# Patient Record
Sex: Female | Born: 1945 | Race: White | Hispanic: No | Smoking: Current some day smoker
Health system: Southern US, Community
[De-identification: ages and names within clinical notes are randomized; demographics above are authoritative.]

## PROBLEM LIST (undated history)

## (undated) DIAGNOSIS — H269 Unspecified cataract: Secondary | ICD-10-CM

## (undated) DIAGNOSIS — Z8619 Personal history of other infectious and parasitic diseases: Secondary | ICD-10-CM

## (undated) DIAGNOSIS — T7840XA Allergy, unspecified, initial encounter: Secondary | ICD-10-CM

## (undated) DIAGNOSIS — K219 Gastro-esophageal reflux disease without esophagitis: Secondary | ICD-10-CM

## (undated) DIAGNOSIS — F419 Anxiety disorder, unspecified: Secondary | ICD-10-CM

## (undated) DIAGNOSIS — I701 Atherosclerosis of renal artery: Secondary | ICD-10-CM

## (undated) DIAGNOSIS — E785 Hyperlipidemia, unspecified: Secondary | ICD-10-CM

## (undated) DIAGNOSIS — R413 Other amnesia: Secondary | ICD-10-CM

## (undated) DIAGNOSIS — M1990 Unspecified osteoarthritis, unspecified site: Secondary | ICD-10-CM

## (undated) DIAGNOSIS — I150 Renovascular hypertension: Secondary | ICD-10-CM

## (undated) DIAGNOSIS — Z923 Personal history of irradiation: Secondary | ICD-10-CM

## (undated) DIAGNOSIS — Z9889 Other specified postprocedural states: Secondary | ICD-10-CM

## (undated) DIAGNOSIS — I10 Essential (primary) hypertension: Secondary | ICD-10-CM

## (undated) DIAGNOSIS — I809 Phlebitis and thrombophlebitis of unspecified site: Secondary | ICD-10-CM

## (undated) DIAGNOSIS — C189 Malignant neoplasm of colon, unspecified: Secondary | ICD-10-CM

## (undated) DIAGNOSIS — R112 Nausea with vomiting, unspecified: Secondary | ICD-10-CM

## (undated) HISTORY — DX: Anxiety disorder, unspecified: F41.9

## (undated) HISTORY — DX: Personal history of other infectious and parasitic diseases: Z86.19

## (undated) HISTORY — PX: BREAST SURGERY: SHX581

## (undated) HISTORY — PX: NASAL SINUS SURGERY: SHX719

## (undated) HISTORY — DX: Unspecified cataract: H26.9

## (undated) HISTORY — DX: Phlebitis and thrombophlebitis of unspecified site: I80.9

## (undated) HISTORY — DX: Unspecified osteoarthritis, unspecified site: M19.90

## (undated) HISTORY — PX: RENAL ARTERY STENT: SHX2321

## (undated) HISTORY — DX: Allergy, unspecified, initial encounter: T78.40XA

## (undated) HISTORY — PX: ABDOMINAL HYSTERECTOMY: SHX81

## (undated) HISTORY — DX: Essential (primary) hypertension: I10

## (undated) HISTORY — DX: Renovascular hypertension: I15.0

## (undated) HISTORY — DX: Atherosclerosis of renal artery: I70.1

## (undated) HISTORY — DX: Gastro-esophageal reflux disease without esophagitis: K21.9

## (undated) HISTORY — DX: Hyperlipidemia, unspecified: E78.5

## (undated) HISTORY — PX: CATARACT EXTRACTION: SUR2

---

## 1998-07-21 ENCOUNTER — Other Ambulatory Visit: Admission: RE | Admit: 1998-07-21 | Discharge: 1998-07-21 | Payer: Self-pay | Admitting: Obstetrics and Gynecology

## 2001-07-02 ENCOUNTER — Encounter: Payer: Self-pay | Admitting: Cardiovascular Disease

## 2001-07-02 ENCOUNTER — Ambulatory Visit (HOSPITAL_COMMUNITY): Admission: RE | Admit: 2001-07-02 | Discharge: 2001-07-03 | Payer: Self-pay | Admitting: Cardiovascular Disease

## 2001-07-02 HISTORY — PX: RENAL ARTERY ANGIOPLASTY: SHX2317

## 2001-11-06 ENCOUNTER — Other Ambulatory Visit: Admission: RE | Admit: 2001-11-06 | Discharge: 2001-11-06 | Payer: Self-pay | Admitting: Obstetrics and Gynecology

## 2005-04-24 ENCOUNTER — Other Ambulatory Visit: Admission: RE | Admit: 2005-04-24 | Discharge: 2005-04-24 | Payer: Self-pay | Admitting: Obstetrics and Gynecology

## 2005-12-25 ENCOUNTER — Encounter: Admission: RE | Admit: 2005-12-25 | Discharge: 2005-12-25 | Payer: Self-pay | Admitting: Otolaryngology

## 2009-09-08 HISTORY — PX: NM MYOCAR PERF WALL MOTION: HXRAD629

## 2012-06-09 HISTORY — PX: TRANSTHORACIC ECHOCARDIOGRAM: SHX275

## 2012-06-25 ENCOUNTER — Other Ambulatory Visit (HOSPITAL_COMMUNITY): Payer: Self-pay | Admitting: Cardiovascular Disease

## 2012-06-25 DIAGNOSIS — R011 Cardiac murmur, unspecified: Secondary | ICD-10-CM

## 2012-06-25 DIAGNOSIS — I10 Essential (primary) hypertension: Secondary | ICD-10-CM

## 2012-06-25 DIAGNOSIS — I517 Cardiomegaly: Secondary | ICD-10-CM

## 2012-07-08 ENCOUNTER — Ambulatory Visit (HOSPITAL_COMMUNITY)
Admission: RE | Admit: 2012-07-08 | Discharge: 2012-07-08 | Disposition: A | Discharge status: Home / Self Care | Payer: Medicare Other | Source: Ambulatory Visit | Attending: Cardiovascular Disease | Admitting: Cardiovascular Disease

## 2012-07-08 DIAGNOSIS — R011 Cardiac murmur, unspecified: Secondary | ICD-10-CM

## 2012-07-08 DIAGNOSIS — I517 Cardiomegaly: Secondary | ICD-10-CM | POA: Insufficient documentation

## 2012-07-08 DIAGNOSIS — I10 Essential (primary) hypertension: Secondary | ICD-10-CM | POA: Insufficient documentation

## 2012-07-08 NOTE — Progress Notes (Signed)
2D Echo Performed 07/08/2012    Anniebelle Devore, RCS  

## 2012-07-22 ENCOUNTER — Telehealth (HOSPITAL_COMMUNITY): Payer: Self-pay | Admitting: Vascular Surgery

## 2012-07-23 ENCOUNTER — Encounter: Payer: Self-pay | Admitting: Pharmacist Clinician (PhC)/ Clinical Pharmacy Specialist

## 2012-08-06 ENCOUNTER — Encounter: Payer: Self-pay | Admitting: Pharmacist Clinician (PhC)/ Clinical Pharmacy Specialist

## 2012-09-01 ENCOUNTER — Telehealth (HOSPITAL_COMMUNITY): Payer: Self-pay | Admitting: Cardiovascular Disease

## 2012-09-01 ENCOUNTER — Other Ambulatory Visit (HOSPITAL_COMMUNITY): Payer: Self-pay | Admitting: Cardiovascular Disease

## 2012-09-01 DIAGNOSIS — IMO0002 Reserved for concepts with insufficient information to code with codable children: Secondary | ICD-10-CM

## 2012-09-01 NOTE — Telephone Encounter (Signed)
Left message for patient to call back and schedule testing ordered by raw

## 2012-09-08 HISTORY — PX: OTHER SURGICAL HISTORY: SHX169

## 2012-09-15 ENCOUNTER — Ambulatory Visit (HOSPITAL_COMMUNITY)
Admission: RE | Admit: 2012-09-15 | Discharge: 2012-09-15 | Disposition: A | Discharge status: Home / Self Care | Payer: Medicare Other | Source: Ambulatory Visit | Attending: Cardiovascular Disease | Admitting: Cardiovascular Disease

## 2012-09-15 DIAGNOSIS — Z9889 Other specified postprocedural states: Secondary | ICD-10-CM | POA: Insufficient documentation

## 2012-09-15 DIAGNOSIS — IMO0002 Reserved for concepts with insufficient information to code with codable children: Secondary | ICD-10-CM

## 2012-09-15 NOTE — Progress Notes (Signed)
Renal Artery Duplex Completed. °[name] ° °

## 2012-12-11 ENCOUNTER — Other Ambulatory Visit: Payer: Self-pay | Admitting: *Deleted

## 2012-12-11 MED ORDER — ATORVASTATIN CALCIUM 80 MG PO TABS: 80.0000 mg | ORAL_TABLET | Freq: Every day | ORAL | Status: DC

## 2012-12-11 MED ORDER — ATENOLOL 50 MG PO TABS: 50.0000 mg | ORAL_TABLET | Freq: Every day | ORAL | Status: DC

## 2013-04-26 ENCOUNTER — Other Ambulatory Visit: Payer: Self-pay | Admitting: *Deleted

## 2013-04-26 MED ORDER — CANDESARTAN CILEXETIL 32 MG PO TABS: 32.0000 mg | ORAL_TABLET | Freq: Every day | ORAL | Status: DC

## 2013-04-26 NOTE — Telephone Encounter (Signed)
Rx was sent to pharmacy electronically. 

## 2013-07-31 ENCOUNTER — Encounter: Payer: Self-pay | Admitting: *Deleted

## 2013-08-03 ENCOUNTER — Encounter: Payer: Self-pay | Admitting: Cardiovascular Disease

## 2013-08-06 ENCOUNTER — Ambulatory Visit (INDEPENDENT_AMBULATORY_CARE_PROVIDER_SITE_OTHER): Payer: Medicare Other | Admitting: Internal Medicine

## 2013-08-06 ENCOUNTER — Encounter: Payer: Self-pay | Admitting: Internal Medicine

## 2013-08-06 VITALS — BP 170/96 | HR 85 | Ht 63.0 in | Wt 157.3 lb

## 2013-08-06 DIAGNOSIS — I701 Atherosclerosis of renal artery: Secondary | ICD-10-CM

## 2013-08-06 DIAGNOSIS — I10 Essential (primary) hypertension: Secondary | ICD-10-CM

## 2013-08-06 DIAGNOSIS — E785 Hyperlipidemia, unspecified: Secondary | ICD-10-CM

## 2013-08-06 MED ORDER — ATORVASTATIN CALCIUM 80 MG PO TABS: 80.0000 mg | ORAL_TABLET | Freq: Every day | ORAL | Status: DC

## 2013-08-06 MED ORDER — ATENOLOL 50 MG PO TABS: 50.0000 mg | ORAL_TABLET | Freq: Every day | ORAL | Status: DC

## 2013-08-06 NOTE — Progress Notes (Signed)
OFFICE NOTE  Chief Complaint:  No complaints, intolerant to lipitor, estalish new cardiologist  Primary Care Physician: No PCP Per Patient  HPI:  Glenda Stevens is a 68 year old female previously followed by Dr. Rollene Stevens. She is here to establish care with me today. She has a history of labile hypertension in the past which she says is predominantly "white coat" hypertension. She reports her blood pressures are much better at home. She underwent renal artery stenting in 2003 on the left with 2 stents and a subsequently had in-stent restenosis with up to 60-99% stenosis of the left renal artery in 2014.  She does get annual Dopplers. She also has a history of dyslipidemia and recently has been having intolerance to atorvastatin and reports noncompliance with the medication. She is in need for refills today. Finally she is recently made an appointment for a new care provider that is coming up in a few weeks. She denies any chest pain or worsening shortness of breath.  PMHx:  Past Medical History  Diagnosis Date  . Renal artery stenosis     left stent in 2003  . Renovascular hypertension   . Hypertension   . Hyperlipidemia   . GERD (gastroesophageal reflux disease)     Past Surgical History  Procedure Laterality Date  . Transthoracic echocardiogram  06/2012    EF 55-60%, mild MR  . Nm myocar perf wall motion  09/2009    bruce myoview - normal pattern of perfusion, EF 83%, low risk scan  . Renal doppler  09/2012    left renal artery stent - 60-99% diameter reduction  . Renal artery angioplasty  07/02/2001    6x57mm Genesis on Aviator balloon stent overlapping a 6x23mm Genesist on Aviator balloon stent (Dr. Marella Chimes)    FAMHx:  Family History  Problem Relation Age of Onset  . Hypertension Mother   . Heart disease Mother   . Heart disease Father   . Hyperlipidemia Father     SOCHx:   reports that she quit smoking about 12 years ago. Her smoking use included Cigarettes. She  has a 25 pack-year smoking history. She has never used smokeless tobacco. She reports that she drinks about 3.5 ounces of alcohol per week. She reports that she does not use illicit drugs.  ALLERGIES:  Allergies  Allergen Reactions  . Ace Inhibitors   . Crestor [Rosuvastatin]     ROS: A comprehensive review of systems was negative.  HOME MEDS: Current Outpatient Prescriptions  Medication Sig Dispense Refill  . ALPRAZolam (XANAX PO) Take by mouth as needed.      Marland Kitchen aspirin 81 MG tablet Take 81 mg by mouth daily.      Marland Kitchen atenolol (TENORMIN) 50 MG tablet Take 1 tablet (50 mg total) by mouth daily.  90 tablet  3  . atorvastatin (LIPITOR) 80 MG tablet Take 1 tablet (80 mg total) by mouth daily.  90 tablet  3  . b complex vitamins capsule Take 1 capsule by mouth daily.      . candesartan (ATACAND) 32 MG tablet Take 1 tablet (32 mg total) by mouth daily.  90 tablet  0  . Cetirizine HCl (ZYRTEC PO) Take by mouth as needed.      . Omega-3 Fatty Acids (FISH OIL) 1000 MG CAPS Take 1 capsule by mouth daily.      . ranitidine (ZANTAC) 150 MG tablet Take 150 mg by mouth daily as needed for heartburn.      . sertraline (  ZOLOFT) 100 MG tablet Take 100 mg by mouth 2 (two) times daily.       No current facility-administered medications for this visit.    LABS/IMAGING: No results found for this or any previous visit (from the past 48 hour(s)). No results found.  VITALS: BP 170/96  Pulse 85  Ht 5\' 3"  (1.6 m)  Wt 157 lb 4.8 oz (71.351 kg)  BMI 27.87 kg/m2  EXAM: General appearance: alert and no distress Neck: no carotid bruit, no JVD and thyroid not enlarged, symmetric, no tenderness/mass/nodules Lungs: clear to auscultation bilaterally Heart: regular rate and rhythm, S1, S2 normal, no murmur, click, rub or gallop Abdomen: soft, non-tender; bowel sounds normal; no masses,  no organomegaly Extremities: extremities normal, atraumatic, no cyanosis or edema Pulses: 2+ and symmetric Skin: Skin  color, texture, turgor normal. No rashes or lesions Neurologic: Grossly normal Psych: Mood, affect nomrla  EKG: Normal sinus rhythm at 95, nonspecific T wave changes  ASSESSMENT: 1. Labile hypertension (pressures are 546 systolic at home by her report) 2. Dyslipidemia 3. History of left renal artery stenting with subsequent in-stent restenosis  PLAN: 1.   Glenda Stevens is doing well without complaints. Her cholesterol may be elevated due to noncompliance with her medications. I would like to recheck labs today and recommend that she restart her current medications. I will provide prescription refills for her cardiac medications. I will defer Xanax, Zoloft and other medications to her primary care provider. She is due for repeat renal artery Doppler in July 2015 and if there is progressive stenosis, I will refer her to Dr. Alvester Chou, my partner for evaluation of possible angioplasty.  Pixie Casino, MD, [facility] Attending Cardiologist [place] 08/06/2013, 2:32 PM

## 2013-08-06 NOTE — Patient Instructions (Addendum)
Your physician recommends that you return for lab work at your earliest convenience.   We have refilled your atenolol and atorvastatin.   Your physician wants you to follow-up in: 1 year. You will receive a reminder letter in the mail two months in advance. If you don't receive a letter, please call our office to schedule the follow-up appointment.  Renal doppler in July 2015

## 2013-09-14 ENCOUNTER — Ambulatory Visit (INDEPENDENT_AMBULATORY_CARE_PROVIDER_SITE_OTHER): Payer: Medicare Other | Admitting: Family Medicine

## 2013-09-14 ENCOUNTER — Encounter: Payer: Self-pay | Admitting: Family Medicine

## 2013-09-14 VITALS — BP 152/108 | HR 74 | Temp 98.2°F | Ht 61.75 in | Wt 157.5 lb

## 2013-09-14 DIAGNOSIS — I10 Essential (primary) hypertension: Secondary | ICD-10-CM

## 2013-09-14 DIAGNOSIS — J3489 Other specified disorders of nose and nasal sinuses: Secondary | ICD-10-CM

## 2013-09-14 DIAGNOSIS — R0981 Nasal congestion: Secondary | ICD-10-CM

## 2013-09-14 DIAGNOSIS — E785 Hyperlipidemia, unspecified: Secondary | ICD-10-CM

## 2013-09-14 DIAGNOSIS — F17200 Nicotine dependence, unspecified, uncomplicated: Secondary | ICD-10-CM

## 2013-09-14 NOTE — Progress Notes (Signed)
Pre visit review using our clinic review tool, if applicable. No additional management support is needed unless otherwise documented below in the visit note.  New patient.   Hypertension:    Using medication without problems or lightheadedness: yes Chest pain with exertion:no Edema:no Short of breath:likely only from smoking.  Average home BPs: lower, 130/70s at home.  Smoking cessation d/w pt.  She'll consider.    Elevated Cholesterol: Using medications without problems: had tolerated lipitor prev.   Muscle aches: not now- off med H/o RAS and d/w pt about smoking,lipids and stent patency.   She is due for f/u lipids with cards later this year.    SSRI and BZD per Dr. Toy Care with psych.    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   She had a bx with ENT and was never given dx of NF1.  No skin lesion to suggest it.  Requesting records from ENT.  It is unlikely that she has NF1, d/w pt.    Meds, vitals, and allergies reviewed.   PMH and SH reviewed  ROS: See HPI.  Otherwise negative.    GEN: nad, alert and oriented HEENT: mucous membranes moist NECK: supple w/o LA CV: rrr. PULM: ctab, no inc wob ABD: soft, +bs EXT: no edema SKIN: no acute rash

## 2013-09-14 NOTE — Assessment & Plan Note (Signed)
Encouraged cessation.

## 2013-09-14 NOTE — Assessment & Plan Note (Signed)
Continue current meds, controlled at home checks.

## 2013-09-14 NOTE — Assessment & Plan Note (Signed)
Requesting ENT records.  D/w pt about prev MRI report.

## 2013-09-14 NOTE — Assessment & Plan Note (Signed)
Encouraged to restart statin and f/u with cards for lipid panel.

## 2013-09-14 NOTE — Patient Instructions (Addendum)
Start back on the lipitor and get the follow up labs done with cardiology.  I'll work on getting your records from ENT.   Schedule a physical in about 6 months.  Take care.  Glad to see you.  Work on picking a quit date for smoking.

## 2013-09-21 ENCOUNTER — Encounter (HOSPITAL_COMMUNITY): Payer: Medicare Other

## 2013-09-27 ENCOUNTER — Telehealth: Payer: Self-pay | Admitting: Family Medicine

## 2013-09-27 DIAGNOSIS — R9389 Abnormal findings on diagnostic imaging of other specified body structures: Secondary | ICD-10-CM

## 2013-09-27 NOTE — Telephone Encounter (Signed)
Call pt.  Per records, Dr. 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  She was possibly going to see Dr. Erling Cruz (since retired) at [facility].  Did she ever go for that?  Has she had any recent eye clinic eval?  Let me know.  Thanks.

## 2013-09-27 NOTE — Telephone Encounter (Signed)
Patient says she was not aware that she was supposed to have seen Dr. Erling Cruz so she has not.  She does get yearly eye exams.

## 2013-09-28 NOTE — Telephone Encounter (Signed)
Left detailed message on cell phone VM.

## 2013-09-28 NOTE — Telephone Encounter (Signed)
I would have her see neuro to get an opinion on this.  I put in the referral.  Dr. Erling Cruz retired in the meantime, so she'll see a different doc.  Thanks.

## 2013-09-29 ENCOUNTER — Ambulatory Visit (HOSPITAL_COMMUNITY)
Admission: RE | Admit: 2013-09-29 | Discharge: 2013-09-29 | Disposition: A | Discharge status: Home / Self Care | Payer: Medicare Other | Source: Ambulatory Visit | Attending: Internal Medicine | Admitting: Internal Medicine

## 2013-09-29 DIAGNOSIS — I10 Essential (primary) hypertension: Secondary | ICD-10-CM

## 2013-09-29 DIAGNOSIS — I701 Atherosclerosis of renal artery: Secondary | ICD-10-CM | POA: Insufficient documentation

## 2013-09-29 NOTE — Progress Notes (Signed)
Renal Duplex Completed. Dexton Zwilling, BS, RDMS, RVT  

## 2013-09-30 NOTE — Telephone Encounter (Signed)
Pt returned [name] call. After reading over note, pt was informed Rosaria Ferries or Vaughan Basta would be calling her with referral. Pt verbalized understanding.

## 2013-10-03 ENCOUNTER — Other Ambulatory Visit: Payer: Self-pay | Admitting: Cardiovascular Disease

## 2013-10-05 ENCOUNTER — Other Ambulatory Visit: Payer: Self-pay | Admitting: *Deleted

## 2013-10-05 DIAGNOSIS — I701 Atherosclerosis of renal artery: Secondary | ICD-10-CM

## 2013-10-05 NOTE — Telephone Encounter (Signed)
Rx was sent to pharmacy electronically. 

## 2013-11-30 ENCOUNTER — Telehealth: Payer: Self-pay | Admitting: *Deleted

## 2013-11-30 ENCOUNTER — Ambulatory Visit: Payer: Medicare Other | Admitting: Neurology

## 2013-11-30 NOTE — Telephone Encounter (Signed)
Yes, ok to reschedule

## 2013-11-30 NOTE — Telephone Encounter (Signed)
Please advise 

## 2013-11-30 NOTE — Telephone Encounter (Signed)
Patient called today unable to keep her new patient appointment due to family emergency. Is it ok to reschedule this patient? Dr. Carole Civil office notified.

## 2013-12-01 ENCOUNTER — Telehealth: Payer: Self-pay | Admitting: Neurology

## 2013-12-01 NOTE — Telephone Encounter (Signed)
Pt no showed NP appt w/ Dr. Posey Pronto. Referring office notified via EPIC referral.  Danae Chen - please send no show to pt / Sherri S.

## 2013-12-02 NOTE — Telephone Encounter (Signed)
Patient called the day of her appointment to cancel i however already notified Dr. Carole Civil office and checked with Dr. Posey Pronto for rescheduling. Should i send a no show letter?

## 2013-12-02 NOTE — Telephone Encounter (Signed)
We will not need to send a no show letter, thank you for bringing this to my attention / Sherri

## 2013-12-13 NOTE — Telephone Encounter (Signed)
Open in error

## 2014-02-08 LAB — HM MAMMOGRAPHY: HM MAMMO: NORMAL

## 2014-02-09 ENCOUNTER — Encounter: Payer: Self-pay | Admitting: Family Medicine

## 2014-02-10 ENCOUNTER — Encounter: Payer: Self-pay | Admitting: *Deleted

## 2014-03-15 ENCOUNTER — Telehealth: Payer: Self-pay

## 2014-03-15 ENCOUNTER — Other Ambulatory Visit: Payer: Self-pay | Admitting: Internal Medicine

## 2014-03-15 LAB — LIPID PANEL
CHOL/HDL RATIO: 3.6 ratio
CHOLESTEROL: 247 mg/dL — AB (ref 0–200)
HDL: 68 mg/dL (ref >39)
LDL Cholesterol: 155 mg/dL — ABNORMAL HIGH (ref 0–99)
TRIGLYCERIDES: 122 mg/dL (ref <150)
VLDL: 24 mg/dL (ref 0–40)

## 2014-03-15 NOTE — Telephone Encounter (Signed)
Pt had lipid panel done today at [facility]; pt had requested [facility] to add Dr Josefine Class lab testing; [facility] could not find order in system. Terri in [facility] lab will contact [facility] and add test that Dr Damita Dunnings has in future orders. If any questions or concerns Karna Christmas will contact pt at home #. Pt voiced understanding.

## 2014-03-16 ENCOUNTER — Other Ambulatory Visit (INDEPENDENT_AMBULATORY_CARE_PROVIDER_SITE_OTHER): Payer: Medicare Other

## 2014-03-16 DIAGNOSIS — I10 Essential (primary) hypertension: Secondary | ICD-10-CM

## 2014-03-16 LAB — COMPREHENSIVE METABOLIC PANEL
ALBUMIN: 4.1 g/dL (ref 3.5–5.2)
ALT: 23 U/L (ref 0–35)
AST: 26 U/L (ref 0–37)
Alkaline Phosphatase: 56 U/L (ref 39–117)
BUN: 9 mg/dL (ref 6–23)
CALCIUM: 9.3 mg/dL (ref 8.4–10.5)
CHLORIDE: 102 meq/L (ref 96–112)
CO2: 29 meq/L (ref 19–32)
Creatinine, Ser: 0.7 mg/dL (ref 0.4–1.2)
GFR: 85.05 mL/min (ref >60.00)
Glucose, Bld: 108 mg/dL — ABNORMAL HIGH (ref 70–99)
Potassium: 4 mEq/L (ref 3.5–5.1)
Sodium: 135 mEq/L (ref 135–145)
TOTAL PROTEIN: 7 g/dL (ref 6.0–8.3)
Total Bilirubin: 0.5 mg/dL (ref 0.2–1.2)

## 2014-03-17 ENCOUNTER — Ambulatory Visit (INDEPENDENT_AMBULATORY_CARE_PROVIDER_SITE_OTHER): Payer: Medicare Other | Admitting: Family Medicine

## 2014-03-17 ENCOUNTER — Encounter: Payer: Self-pay | Admitting: Family Medicine

## 2014-03-17 VITALS — BP 144/86 | HR 69 | Temp 98.3°F | Ht 62.25 in | Wt 157.5 lb

## 2014-03-17 DIAGNOSIS — Z0000 Encounter for general adult medical examination without abnormal findings: Secondary | ICD-10-CM

## 2014-03-17 DIAGNOSIS — F17200 Nicotine dependence, unspecified, uncomplicated: Secondary | ICD-10-CM

## 2014-03-17 DIAGNOSIS — Z23 Encounter for immunization: Secondary | ICD-10-CM

## 2014-03-17 DIAGNOSIS — L821 Other seborrheic keratosis: Secondary | ICD-10-CM

## 2014-03-17 DIAGNOSIS — Z7189 Other specified counseling: Secondary | ICD-10-CM

## 2014-03-17 DIAGNOSIS — I10 Essential (primary) hypertension: Secondary | ICD-10-CM

## 2014-03-17 DIAGNOSIS — Z720 Tobacco use: Secondary | ICD-10-CM

## 2014-03-17 DIAGNOSIS — Z780 Asymptomatic menopausal state: Secondary | ICD-10-CM

## 2014-03-17 DIAGNOSIS — E785 Hyperlipidemia, unspecified: Secondary | ICD-10-CM

## 2014-03-17 DIAGNOSIS — Z1211 Encounter for screening for malignant neoplasm of colon: Secondary | ICD-10-CM

## 2014-03-17 MED ORDER — ATORVASTATIN CALCIUM 80 MG PO TABS: 40.0000 mg | ORAL_TABLET | Freq: Every day | ORAL | Status: DC

## 2014-03-17 NOTE — Progress Notes (Signed)
Pre visit review using our clinic review tool, if applicable. No additional management support is needed unless otherwise documented below in the visit note.  I have personally reviewed the Medicare Annual Wellness questionnaire and have noted 1. The patient's medical and social history 2. Their use of alcohol, tobacco or illicit drugs 3. Their current medications and supplements 4. The patient's functional ability including ADL's, fall risks, home safety risks and hearing or visual             impairment. 5. Diet and physical activities 6. Evidence for depression or mood disorders  The patients weight, height, BMI have been recorded in the chart and visual acuity is per eye clinic.  I have made referrals, counseling and provided education to the patient based review of the above and I have provided the pt with a written personalized care plan for preventive services.  Provider list updated- see scanned forms.  Routine anticipatory guidance given to patient.  See health maintenance.  Flu 2015 Shingles d/w pt.  PNA 2011, updated 2015 Tetanus d/w pt.  She wanted to defer for now since she got the PNA vaccine today D/w patient [id number] for colon cancer screening, including IFOB vs. colonoscopy.  Risks and benefits of both were discussed and patient voiced understanding.  Pt elects PHX:TAVW Breast cancer screening 2015 DXA ordered, d/w pt.  Advance directive- son designated if patient were incapacitated.  Cognitive function addressed- see scanned forms- and if abnormal then additional documentation follows.   Hypertension:    Using medication without problems or lightheadedness: yes Chest pain with exertion:no Edema:no Short of breath:no Average home BPs: usually lower than today at clinic. H/o white coat syndrome per patient.  Labs d/w pt.   Elevated Cholesterol: Using medications without problems: prev with muscle stiffness at 80mg  of lipitor.  Off med currently, resolved.  Muscle  aches:  See above Diet compliance:yes Exercise:yes Other complaints: d/w pt about restart at 40mg  a day.  H/o RAS noted, s/p stent, d/w pt.   Skin lesion.  L forearm. Irritated.  Asking for input.   She asked about lung cancer screening.  Still smoking. Encouraged cessation.  I told her I'd look into lung cancer screening protocol.   PMH and SH reviewed  Meds, vitals, and allergies reviewed.   ROS: See HPI.  Otherwise negative.    GEN: nad, alert and oriented HEENT: mucous membranes moist NECK: supple w/o LA CV: rrr. PULM: ctab, no inc wob ABD: soft, +bs EXT: no edema SKIN: no acute rash but 62mm irritated SK noted on L forearm. Treated x3 with liq N2 after discussion with patient.  Would be tolerated more easily than shave. Frozen w/o complications and routine instructions given to patient.  Nonirritated SK noted on abd wall.

## 2014-03-17 NOTE — Patient Instructions (Addendum)
Check with your insurance to see if they will cover the shingles shot. Schedule a nurse visit about getting a tetanus shot.  Rosaria Ferries will call about your referral. Go to the lab on the way out.  We'll contact you with your lab report (stool cards). Keep the spot on your arm clean and covered as needed. It should flake off in a few days.

## 2014-03-18 ENCOUNTER — Encounter: Payer: Self-pay | Admitting: Family Medicine

## 2014-03-18 ENCOUNTER — Telehealth: Payer: Self-pay | Admitting: Family Medicine

## 2014-03-18 DIAGNOSIS — L821 Other seborrheic keratosis: Secondary | ICD-10-CM | POA: Insufficient documentation

## 2014-03-18 DIAGNOSIS — Z7189 Other specified counseling: Secondary | ICD-10-CM | POA: Insufficient documentation

## 2014-03-18 DIAGNOSIS — Z0000 Encounter for general adult medical examination without abnormal findings: Secondary | ICD-10-CM | POA: Insufficient documentation

## 2014-03-18 NOTE — Assessment & Plan Note (Signed)
See above, irritated, treated, routine instructions given to patient.

## 2014-03-18 NOTE — Assessment & Plan Note (Signed)
Flu 2015  Shingles d/w pt.  PNA 2011, updated 2015  Tetanus d/w pt. She wanted to defer for now since she got the PNA vaccine today  D/w patient [id number] for colon cancer screening, including IFOB vs. colonoscopy. Risks and benefits of both were discussed and patient voiced understanding. Pt elects TEI:HDTP  Breast cancer screening 2015  DXA ordered, d/w pt.  Advance directive- son designated if patient were incapacitated.  Cognitive function addressed- see scanned forms- and if abnormal then additional documentation follows.

## 2014-03-18 NOTE — Assessment & Plan Note (Signed)
She asked about lung cancer screening.  Still smoking. Encouraged cessation.  I told her I'd look into lung cancer screening protocol.

## 2014-03-18 NOTE — Assessment & Plan Note (Signed)
Restart 40mg  lipitor, she'll notify me if she can't tolerate that.  Labs d/w pt.

## 2014-03-18 NOTE — Assessment & Plan Note (Signed)
Controlled, continue as is.  Labs d/w pt.  

## 2014-03-18 NOTE — Telephone Encounter (Signed)
emmi emailed °

## 2014-03-24 ENCOUNTER — Telehealth: Payer: Self-pay | Admitting: Family Medicine

## 2014-03-24 NOTE — Telephone Encounter (Signed)
Also LVM for pt to call back and schedule bone density

## 2014-03-24 NOTE — Telephone Encounter (Signed)
Please call pt. I'm trying to get her enrolled in the lung cancer screening program.  I need to know if she has smoked more than 30 pack years.  If so, we can likely get her enrolled in the screening program.  If less than 30 pack year, I can likely order the screening but the coverage for it may be different.  I'm more than willing to work on this either way.  Thanks.

## 2014-03-24 NOTE — Telephone Encounter (Signed)
Left message on patient's voicemail to return call

## 2014-03-28 NOTE — Telephone Encounter (Signed)
Left message on patient's voicemail (cell and home #'s) to return call.

## 2014-03-28 NOTE — Telephone Encounter (Signed)
Patient says she does meet the criteria for 30 pack year smoking.

## 2014-03-29 NOTE — Telephone Encounter (Signed)
Noted, I'll still order this CT if she desires- let me know one way or the other and I'll proceed. Thanks.

## 2014-03-29 NOTE — Telephone Encounter (Signed)
Patient says she really doesn't know anything about the lung cancer screening program but is interested in following up somehow, whether that be the CT or the program or both.

## 2014-03-31 NOTE — Telephone Encounter (Signed)
Left detailed message on voicemail.  

## 2014-03-31 NOTE — Telephone Encounter (Signed)
Please call pt. I talked with Rosaria Ferries about this.  There is a plan to have a protocol set up and running in the near future- about 6-8 weeks from now- that will likely involve the pulmonary department.  It should streamline the process for the screening and the follow up (since people often need a second scan about 1 year out from the initial scan).  I would wait to get that done in about 2 months.  I didn't put in the order now, but I will in about 2 months.  That should still be a clinically reasonable time. I sent myself a reminder for the middle of March.  We'll contact her at that point.  Thanks.

## 2014-04-04 ENCOUNTER — Inpatient Hospital Stay: Admission: RE | Admit: 2014-04-04 | Payer: Medicare Other | Source: Ambulatory Visit

## 2014-04-10 ENCOUNTER — Telehealth: Payer: Self-pay | Admitting: Family Medicine

## 2014-04-10 NOTE — Telephone Encounter (Signed)
Two issues.  She was going to see neuro last fall- see referral- but it had to be cancelled. Is she going to follow through with that? The other issue is the ACE allergy listed.  It was listed before she ever came to [facility]- it was in the chart prev when she saw cards.  It may have been related to her renal artery stenosis.  That should be an issue now.  I am okay taking it off the list of allergies (since she is already on an ARB) if she is okay with that and if she doesn't recall any specific intolerance, ie cough/rash/angioedema.   Let me know and I'll update the chart. Thanks.

## 2014-04-11 NOTE — Telephone Encounter (Signed)
Left message on patient's voicemail to return call

## 2014-04-12 NOTE — Telephone Encounter (Signed)
Patient states she doesn't think she is going to follow through with neurology.  She says she really doesn't know anything about the ACE allergy.  The only thing she remembers is that she was once on Prinivil and her BP got so low, she was passing out.

## 2014-04-13 NOTE — Telephone Encounter (Signed)
Patient notified as instructed by telephone. 

## 2014-04-13 NOTE — Telephone Encounter (Signed)
Noted all, I removed the ACE allergy.  Thanks.

## 2014-05-17 ENCOUNTER — Other Ambulatory Visit (INDEPENDENT_AMBULATORY_CARE_PROVIDER_SITE_OTHER): Payer: Medicare Other

## 2014-05-17 DIAGNOSIS — Z1211 Encounter for screening for malignant neoplasm of colon: Secondary | ICD-10-CM

## 2014-05-17 LAB — FECAL OCCULT BLOOD, IMMUNOCHEMICAL: FECAL OCCULT BLD: POSITIVE — AB

## 2014-05-18 ENCOUNTER — Telehealth: Payer: Self-pay | Admitting: Radiology

## 2014-05-18 DIAGNOSIS — R195 Other fecal abnormalities: Secondary | ICD-10-CM

## 2014-05-18 NOTE — Telephone Encounter (Signed)
Call pt.  IFOB was positive.  I would her see GI.  I put in the referral.  Thanks.

## 2014-05-18 NOTE — Telephone Encounter (Signed)
[facility] call a POSITIVE ifob result, results sent to Dr Damita Dunnings

## 2014-05-18 NOTE — Telephone Encounter (Signed)
Left message for patient to call back to inform her of IFOB and referral to GI.

## 2014-05-20 NOTE — Telephone Encounter (Signed)
Left message on patient's voicemail to return call on home and cell #.

## 2014-05-23 NOTE — Telephone Encounter (Signed)
Pt left v/m requesting cb [id number].

## 2014-05-24 NOTE — Telephone Encounter (Signed)
Spoke with patient and advised of positive IFOB and referral.  Patient has not seen GI in the past and says [facility] will be fine.

## 2014-05-29 ENCOUNTER — Telehealth: Payer: Self-pay | Admitting: Family Medicine

## 2014-05-29 DIAGNOSIS — Z122 Encounter for screening for malignant neoplasm of respiratory organs: Secondary | ICD-10-CM

## 2014-05-29 NOTE — Telephone Encounter (Signed)
This patient wanted to get in the lung cancer screening program.  Is this up and running yet, or should I put in the order for the CT myself?  Please let me know so I can either order or refer.  Thanks.

## 2014-06-02 ENCOUNTER — Encounter: Payer: Self-pay | Admitting: Gastroenterology

## 2014-06-02 NOTE — Telephone Encounter (Signed)
I havent heard about the Lung CA Screening program as of yet. Just order the Low Dose CT Chest without. I dont have any information about it at this time so I dont give out much info to our patients about the insurance.

## 2014-06-06 DIAGNOSIS — Z122 Encounter for screening for malignant neoplasm of respiratory organs: Secondary | ICD-10-CM | POA: Insufficient documentation

## 2014-06-06 NOTE — Telephone Encounter (Signed)
I put in the order for the screening CT for lung cancer.  The screening program isn't up and running yet.  I can still do all of the needed f/u but it just may not be as streamlined as when the program is in place.  That said, we can still take care of the orders and the follow up.  Notify patient that she is likely going to need another repeat CT in about 1 year.  I have already sent myself a note in the EMR to remind me next year about the follow up.  Thanks.

## 2014-06-07 ENCOUNTER — Encounter: Payer: Self-pay | Admitting: Family Medicine

## 2014-06-15 NOTE — Telephone Encounter (Signed)
Please notify pt (if not already called) that the referral is in and Rosaria Ferries is working on it.  Thanks.

## 2014-06-15 NOTE — Telephone Encounter (Signed)
Patient notified as instructed by telephone and verbalized understanding. 

## 2014-06-30 ENCOUNTER — Telehealth: Payer: Self-pay

## 2014-06-30 NOTE — Telephone Encounter (Signed)
Pt was last seen 03/17/14;ifob + for blood and pt has appt for colonoscopy end of May; for one month pt has had almost constant diarrhea; usually watery diarrhea; pt is still eating and drinking. In last 24 hours pt having stomach cramps and watery diarrhea x 2. No fever. Pt request cb about next step to stop diarrhea. [place]

## 2014-06-30 NOTE — Telephone Encounter (Signed)
Left message on voicemail.

## 2014-06-30 NOTE — Telephone Encounter (Signed)
She needs to make an appt to be seen and discuss further workup that may be needed

## 2014-07-01 ENCOUNTER — Telehealth: Payer: Self-pay | Admitting: Acute Care

## 2014-07-01 ENCOUNTER — Telehealth: Payer: Self-pay

## 2014-07-01 NOTE — Telephone Encounter (Signed)
Pt left v/m returning call and request cb [phone]

## 2014-07-01 NOTE — Telephone Encounter (Signed)
I called left a message for the Glenda Stevens to call and schedule a Shared Decision Making  appointment at her convenience. I supplied all of my contact information within the message, and let her know we could schedule it for about 8 weeks from now ( per her request to the referring MD at [facility]).  I will await her return call.

## 2014-07-03 ENCOUNTER — Other Ambulatory Visit: Payer: Self-pay | Admitting: Internal Medicine

## 2014-07-04 NOTE — Telephone Encounter (Signed)
Called pt no answer will try again later.                                              

## 2014-07-04 NOTE — Telephone Encounter (Signed)
Rx has been sent to the pharmacy electronically. ° °

## 2014-07-05 NOTE — Telephone Encounter (Signed)
Pt has appt with Dr Damita Dunnings 07/07/2014

## 2014-07-05 NOTE — Telephone Encounter (Signed)
Pt has appt with GI

## 2014-07-07 ENCOUNTER — Encounter: Payer: Self-pay | Admitting: Family Medicine

## 2014-07-07 ENCOUNTER — Ambulatory Visit (INDEPENDENT_AMBULATORY_CARE_PROVIDER_SITE_OTHER): Payer: Medicare Other | Admitting: Family Medicine

## 2014-07-07 VITALS — BP 152/92 | HR 78 | Temp 98.8°F | Wt 154.8 lb

## 2014-07-07 DIAGNOSIS — R197 Diarrhea, unspecified: Secondary | ICD-10-CM

## 2014-07-07 MED ORDER — ATENOLOL 50 MG PO TABS: 50.0000 mg | ORAL_TABLET | Freq: Every day | ORAL | Status: DC

## 2014-07-07 NOTE — Patient Instructions (Signed)
Go to the lab on the way out.  We'll contact you with your lab report. Keep taking imodium for now and keep the appointment with GI.   Take care.  Glad to see you.

## 2014-07-07 NOTE — Progress Notes (Signed)
Pre visit review using our clinic review tool, if applicable. No additional management support is needed unless otherwise documented below in the visit note.  H/o constipation and diarrhea, alternating over the years, that was her baseline.  H/o indigestion at baseline.   In the last few months, more diarrhea.  Initially sporadic, then more consistent.  Taking imodium in the last few weeks, with up to 24h w/o a BM.  No gross blood in stool but IFOB was positive.  No vomiting.  No fevers.  Some abd cramping prior, improved with imodium.  Some burning discomfort from hemorrhoids recently.  Urgency with the diarrhea.  No mucous or greasy stools.  Well water.  No other sick contacts.  No new meds.  No new foods.  No recent abx use.  She quit sugar free candy but her improvement was attributed to imodium, not the diet change.  No travel.   It may be that her pos IFOB from was from hemorrhoid irritation, related to this. D/w pt.   Meds, vitals, and allergies reviewed.   ROS: See HPI.  Otherwise, noncontributory.  GEN: nad, alert and oriented HEENT: mucous membranes moist NECK: supple w/o LA CV: rrr.  PULM: ctab, no inc wob ABD: soft, +bs, not ttp.   EXT: no edema

## 2014-07-08 DIAGNOSIS — R197 Diarrhea, unspecified: Secondary | ICD-10-CM | POA: Insufficient documentation

## 2014-07-08 LAB — COMPREHENSIVE METABOLIC PANEL
ALT: 21 U/L (ref 0–35)
AST: 26 U/L (ref 0–37)
Albumin: 4.1 g/dL (ref 3.5–5.2)
Alkaline Phosphatase: 53 U/L (ref 39–117)
BUN: 8 mg/dL (ref 6–23)
CO2: 27 meq/L (ref 19–32)
Calcium: 9.6 mg/dL (ref 8.4–10.5)
Chloride: 100 mEq/L (ref 96–112)
Creatinine, Ser: 0.67 mg/dL (ref 0.40–1.20)
GFR: 92.33 mL/min (ref >60.00)
Glucose, Bld: 91 mg/dL (ref 70–99)
Potassium: 4.2 mEq/L (ref 3.5–5.1)
SODIUM: 133 meq/L — AB (ref 135–145)
TOTAL PROTEIN: 6.9 g/dL (ref 6.0–8.3)
Total Bilirubin: 0.4 mg/dL (ref 0.2–1.2)

## 2014-07-08 LAB — CBC WITH DIFFERENTIAL/PLATELET
Basophils Absolute: 0 10*3/uL (ref 0.0–0.1)
Basophils Relative: 0.4 % (ref 0.0–3.0)
EOS ABS: 0.2 10*3/uL (ref 0.0–0.7)
EOS PCT: 2.9 % (ref 0.0–5.0)
HEMATOCRIT: 43.6 % (ref 36.0–46.0)
Hemoglobin: 14.9 g/dL (ref 12.0–15.0)
LYMPHS ABS: 1.8 10*3/uL (ref 0.7–4.0)
Lymphocytes Relative: 23.1 % (ref 12.0–46.0)
MCHC: 34.1 g/dL (ref 30.0–36.0)
MCV: 90.1 fl (ref 78.0–100.0)
Monocytes Absolute: 0.5 10*3/uL (ref 0.1–1.0)
Monocytes Relative: 5.9 % (ref 3.0–12.0)
Neutro Abs: 5.4 10*3/uL (ref 1.4–7.7)
Neutrophils Relative %: 67.7 % (ref 43.0–77.0)
PLATELETS: 262 10*3/uL (ref 150.0–400.0)
RBC: 4.84 Mil/uL (ref 3.87–5.11)
RDW: 13.4 % (ref 11.5–15.5)
WBC: 8 10*3/uL (ref 4.0–10.5)

## 2014-07-08 NOTE — Assessment & Plan Note (Signed)
It may be that her pos IFOB from was from hemorrhoid irritation, related to this. D/w pt.  Either way, we need to w/u.  Check basic labs today.  Nontoxic.  Continue imodium for now.  She agrees.   Even if her IFOB had been neg, if she had this much diarrhea the she would likely have been referred to GI with likely colonoscopy anyway.   She agrees.

## 2014-07-08 NOTE — Telephone Encounter (Signed)
error 

## 2014-07-13 NOTE — Addendum Note (Signed)
Addended by: Ellamae Sia on: 07/13/2014 03:40 PM   Modules accepted: Orders

## 2014-07-14 LAB — C. DIFFICILE GDH AND TOXIN A/B
C. DIFFICILE GDH: NOT DETECTED
C. difficile Toxin A/B: NOT DETECTED

## 2014-07-17 LAB — STOOL CULTURE

## 2014-07-18 ENCOUNTER — Ambulatory Visit (AMBULATORY_SURGERY_CENTER): Payer: Self-pay

## 2014-07-18 VITALS — Ht 62.5 in | Wt 155.6 lb

## 2014-07-18 DIAGNOSIS — Z1211 Encounter for screening for malignant neoplasm of colon: Secondary | ICD-10-CM

## 2014-07-18 NOTE — Progress Notes (Signed)
No allergies to eggs or soy No diet/weight loss meds No home oxygen No past problems with anesthesia except PONV with hysterectomy  Has email  Emmi instructions given for colonoscopy

## 2014-07-19 ENCOUNTER — Telehealth: Payer: Self-pay | Admitting: Acute Care

## 2014-07-19 NOTE — Telephone Encounter (Signed)
This is the second attempt to make an appointment with Ms. [name]. I have not had a return call. I have left my name and contact information on the patients answering machine.I will await a return call.

## 2014-07-20 ENCOUNTER — Telehealth: Payer: Self-pay | Admitting: Acute Care

## 2014-07-20 NOTE — Telephone Encounter (Signed)
Glenda Stevens called and we have scheduled her for her shared decision making visit 09/14/14 at 2 pm, and she will go for the CT scan 09/14/14 at 3 pm. She verbalized that she knows to come to [employer]. For her appointment with me, and i will make sure she knows how to get to the scanner after I see her for the Shared Decision Making Visit.She verbalized understanding of the above.

## 2014-08-01 ENCOUNTER — Encounter: Payer: Self-pay | Admitting: *Deleted

## 2014-08-01 ENCOUNTER — Ambulatory Visit (AMBULATORY_SURGERY_CENTER): Payer: Medicare Other | Admitting: Gastroenterology

## 2014-08-01 ENCOUNTER — Other Ambulatory Visit (INDEPENDENT_AMBULATORY_CARE_PROVIDER_SITE_OTHER): Payer: Medicare Other

## 2014-08-01 ENCOUNTER — Other Ambulatory Visit: Payer: Self-pay

## 2014-08-01 ENCOUNTER — Encounter: Payer: Self-pay | Admitting: Gastroenterology

## 2014-08-01 ENCOUNTER — Telehealth: Payer: Self-pay

## 2014-08-01 VITALS — BP 192/109 | HR 73 | Temp 97.0°F | Resp 21 | Ht 62.0 in | Wt 155.0 lb

## 2014-08-01 DIAGNOSIS — D122 Benign neoplasm of ascending colon: Secondary | ICD-10-CM

## 2014-08-01 DIAGNOSIS — D124 Benign neoplasm of descending colon: Secondary | ICD-10-CM | POA: Diagnosis not present

## 2014-08-01 DIAGNOSIS — D120 Benign neoplasm of cecum: Secondary | ICD-10-CM | POA: Diagnosis not present

## 2014-08-01 DIAGNOSIS — D125 Benign neoplasm of sigmoid colon: Secondary | ICD-10-CM

## 2014-08-01 DIAGNOSIS — Z86010 Personal history of colonic polyps: Secondary | ICD-10-CM

## 2014-08-01 DIAGNOSIS — D123 Benign neoplasm of transverse colon: Secondary | ICD-10-CM

## 2014-08-01 DIAGNOSIS — C20 Malignant neoplasm of rectum: Secondary | ICD-10-CM

## 2014-08-01 DIAGNOSIS — Z1211 Encounter for screening for malignant neoplasm of colon: Secondary | ICD-10-CM

## 2014-08-01 DIAGNOSIS — C189 Malignant neoplasm of colon, unspecified: Secondary | ICD-10-CM

## 2014-08-01 HISTORY — DX: Malignant neoplasm of colon, unspecified: C18.9

## 2014-08-01 LAB — CREATININE, SERUM: CREATININE: 0.62 mg/dL (ref 0.40–1.20)

## 2014-08-01 MED ORDER — SODIUM CHLORIDE 0.9 % IV SOLN: 500.0000 mL | INTRAVENOUS | Status: DC

## 2014-08-01 NOTE — Progress Notes (Signed)
Contrast given in recovery room for CT Scan.

## 2014-08-01 NOTE — Progress Notes (Signed)
Specimens sent "RUSH", per Dr. Ardis Hughs order.

## 2014-08-01 NOTE — Telephone Encounter (Signed)
My office will arrange staging workup (CEA, CT scan chest/abd/pelvis) Referral to [facility]

## 2014-08-01 NOTE — Op Note (Signed)
[facility]  [address] [place], [id number]   COLONOSCOPY PROCEDURE REPORT  PATIENT: Glenda Stevens, Glenda Stevens  MR#: [id number] BIRTHDATE: September 03, 1945 , 76  yrs. old GENDER: female ENDOSCOPIST: Milus Banister, MD REFERRED FT:DDUKGU Duncan, M.D. PROCEDURE DATE:  08/01/2014 PROCEDURE:   Colonoscopy with biopsy, Colonoscopy with snare polypectomy, and Submucosal injection, any substance First Screening Colonoscopy - Avg.  risk and is 50 yrs.  old or older - No.  Prior Negative Screening - Now for repeat screening. N/A  History of Adenoma - Now for follow-up colonoscopy & has been > or = to 3 yrs.  N/A  Polyps removed today? Yes ASA CLASS:   Class II INDICATIONS:heme + stool, loose stools for 2-3 months. MEDICATIONS: Monitored anesthesia care and Propofol 400 mg IV  DESCRIPTION OF PROCEDURE:   After the risks benefits and alternatives of the procedure were thoroughly explained, informed consent was obtained.  The digital rectal exam revealed no abnormalities of the rectum.   The LB [id number]  endoscope was introduced through the anus and advanced to the cecum, which was identified by both the appendix and ileocecal valve. No adverse events experienced.   The quality of the prep was excellent.  The instrument was then slowly withdrawn as the colon was fully examined. Estimated blood loss is zero unless otherwise noted in this procedure report.   COLON FINDINGS: There were14 polyps spread throughout the colon. These ranged in size from 42mm to 61mm.  12 of them were removed, 2 of them were not endoscopically removable.  The two that were not removable were located in the transverse colon.  They were adjacent to eachother.  They were located on both sides of a fold, sessile, slightly firm.  The largest of the two (16mm) was biopsied and then the site was labled with SPOT via submucosal injection.  These biopsies were in jar #3.  The other 12 polyp were all  sessile, located in cecum, ascending, hepatic flexure, descending and sigmoid.  Two were removed with snare cautery (ascending and sigmoid), and the rest removed with cold snare.  Sent in three different jars (#1 ascending, cecum), (#2hepatic flexure), (#4 descending, sigmoid).  There was also an ulcerated, malignant appearing distal rectal mass that was adjacent to the internal anal sphincter.  This was non-cirumferential, 3-5cm across, friable, biopsied extensively (#5).  There were 8-10 diminutive polyps spread throughout the colon that I did not remove (2-40mm each). Retroflexed views revealed no abnormalities. The time to cecum = 63min      Withdrawal time = 86min        The scope was withdrawn and the procedure completed. COMPLICATIONS: There were no immediate complications.  ENDOSCOPIC IMPRESSION: Multiple sites of significant neoplasia, see above  RECOMMENDATIONS: Await final pathology.  My office will arrange staging workup (CEA, CT scan chest/abd/pelvis) Referral to [facility]  eSigned:  Milus Banister, MD 08/01/2014 8:23 AM     PATIENT NAME:  Glenda Stevens, Glenda Stevens MR#: [id number]

## 2014-08-01 NOTE — Progress Notes (Signed)
Called to room to assist during endoscopic procedure.  Patient ID and intended procedure confirmed with present staff. Received instructions for my participation in the procedure from the performing physician.  

## 2014-08-01 NOTE — Progress Notes (Signed)
Report to PACU, RN, vss, BBS= Clear.  

## 2014-08-01 NOTE — Patient Instructions (Addendum)
One of your biggest health concerns is your smoking.  This increases your risk for most cancers and serious cardiovascular diseases such as strokes, heart attacks.  You should try your best to stop.  If you need assistance, please contact your PCP or Smoking Cessation Class at [facility] [phone]) or [place] (1-800-QUIT-NOW).  Discharge instructions given. Handout on polyps. Office will schedule CT Scan and lab. Resume precious medications. YOU HAD AN ENDOSCOPIC PROCEDURE TODAY AT [facility]:   Refer to the procedure report that was given to you for any specific questions about what was found during the examination.  If the procedure report does not answer your questions, please call your gastroenterologist to clarify.  If you requested that your care partner not be given the details of your procedure findings, then the procedure report has been included in a sealed envelope for you to review at your convenience later.  YOU SHOULD EXPECT: Some feelings of bloating in the abdomen. Passage of more gas than usual.  Walking can help get rid of the air that was put into your GI tract during the procedure and reduce the bloating. If you had a lower endoscopy (such as a colonoscopy or flexible sigmoidoscopy) you may notice spotting of blood in your stool or on the toilet paper. If you underwent a bowel prep for your procedure, you may not have a normal bowel movement for a few days.  Please Note:  You might notice some irritation and congestion in your nose or some drainage.  This is from the oxygen used during your procedure.  There is no need for concern and it should clear up in a day or so.  SYMPTOMS TO REPORT IMMEDIATELY:   Following lower endoscopy (colonoscopy or flexible sigmoidoscopy):  Excessive amounts of blood in the stool  Significant tenderness or worsening of abdominal pains  Swelling of the abdomen that is new, acute  Fever of 100F or  higher   For urgent or emergent issues, a gastroenterologist can be reached at any hour by calling [phone].   DIET: Your first meal following the procedure should be a small meal and then it is ok to progress to your normal diet. Heavy or fried foods are harder to digest and may make you feel nauseous or bloated.  Likewise, meals heavy in dairy and vegetables can increase bloating.  Drink plenty of fluids but you should avoid alcoholic beverages for 24 hours.  ACTIVITY:  You should plan to take it easy for the rest of today and you should NOT DRIVE or use heavy machinery until tomorrow (because of the sedation medicines used during the test).    FOLLOW UP: Our staff will call the number listed on your records the next business day following your procedure to check on you and address any questions or concerns that you may have regarding the information given to you following your procedure. If we do not reach you, we will leave a message.  However, if you are feeling well and you are not experiencing any problems, there is no need to return our call.  We will assume that you have returned to your regular daily activities without incident.  If any biopsies were taken you will be contacted by phone or by letter within the next 1-3 weeks.  Please call us at [phone] if you have not heard about the biopsies in 3 weeks.    SIGNATURES/CONFIDENTIALITY: You and/or your care partner have signed paperwork which  will be entered into your electronic medical record.  These signatures attest to the fact that that the information above on your After Visit Summary has been reviewed and is understood.  Full responsibility of the confidentiality of this discharge information lies with you and/or your care-partner.

## 2014-08-01 NOTE — Progress Notes (Unsigned)
Per Dr. Owens Loffler request: added case to GI Tumor Board discussion 08/03/14. Will see her in [facility] [place] on 08/12/14 at 96-by Dr. Benay Spice; 0930-Dr. Lisbeth Renshaw; 1030-Dr. Barry Dienes.

## 2014-08-02 ENCOUNTER — Telehealth: Payer: Self-pay

## 2014-08-02 LAB — CEA: CEA: 5.3 ng/mL — ABNORMAL HIGH (ref 0.0–5.0)

## 2014-08-02 NOTE — Telephone Encounter (Signed)
No answer, left voicemail

## 2014-08-03 ENCOUNTER — Telehealth: Payer: Self-pay | Admitting: Gastroenterology

## 2014-08-03 DIAGNOSIS — C210 Malignant neoplasm of anus, unspecified: Secondary | ICD-10-CM

## 2014-08-03 NOTE — Telephone Encounter (Signed)
Dr. Ardis Hughs,  Patient is returning your call from earlier today.  You may return her call to [phone].

## 2014-08-04 ENCOUNTER — Encounter: Payer: Self-pay | Admitting: Gastroenterology

## 2014-08-04 ENCOUNTER — Other Ambulatory Visit: Payer: Self-pay

## 2014-08-04 ENCOUNTER — Ambulatory Visit (INDEPENDENT_AMBULATORY_CARE_PROVIDER_SITE_OTHER)
Admission: RE | Admit: 2014-08-04 | Discharge: 2014-08-04 | Disposition: A | Discharge status: Home / Self Care | Payer: Medicare Other | Source: Ambulatory Visit | Attending: Gastroenterology | Admitting: Gastroenterology

## 2014-08-04 DIAGNOSIS — C210 Malignant neoplasm of anus, unspecified: Secondary | ICD-10-CM

## 2014-08-04 MED ORDER — IOHEXOL 300 MG/ML  SOLN
100.0000 mL | Freq: Once | INTRAMUSCULAR | Status: AC | PRN
  Administered 2014-08-04: 100 mL via INTRAVENOUS

## 2014-08-04 NOTE — Telephone Encounter (Signed)
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   We need to coordinate with [facility] Dr. Leighton Ruff (she is aware) for repeat colonoscopy in the OR  with her present to assist.    That is to remove the large transverse colon polyp site (was tatoo'd already).    Patty can you make contact with her office.  She and I agree that we'd like to have this done within 2 weeks (colonoscopy in OR with Dr. Marcello Moores present, general anesthesia..  How about next Thursday or the following Thursday?  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  Elmo Putt, see above.

## 2014-08-04 NOTE — Telephone Encounter (Signed)
You have been scheduled for a CT scan of the abdomen and pelvis at [facility] ([address]---this is in the same building as [facility]).   You are scheduled on 08/04/14 at 345 pm. You should arrive 15 minutes prior to your appointment time for registration. Please follow the written instructions below on the day of your exam:  WARNING: IF YOU ARE ALLERGIC TO IODINE/X-RAY DYE, PLEASE NOTIFY RADIOLOGY IMMEDIATELY AT [phone]! YOU WILL BE GIVEN A 13 HOUR PREMEDICATION PREP.  1) Do not eat or drink anything after 1145 am (4 hours prior to your test) 2) You have been given 2 bottles of oral contrast to drink. The solution may taste better if refrigerated, but do NOT add ice or any other liquid to this solution. Shake well before drinking.    Drink 1 bottle of contrast @ 145 pm (2 hours prior to your exam)  Drink 1 bottle of contrast @ 245 pm(1 hour prior to your exam)  You may take any medications as prescribed with a small amount of water except for the following: Metformin, Glucophage, Glucovance, Avandamet, Riomet, Fortamet, Actoplus Met, Janumet, Glumetza or Metaglip. The above medications must be held the day of the exam AND 48 hours after the exam.   Pt has been notified and instructed The purpose of you drinking the oral contrast is to aid in the visualization of your intestinal tract. The contrast solution may cause some diarrhea. Before your exam is started, you will be given a small amount of fluid to drink. Depending on your individual set of symptoms, you may also receive an intravenous injection of x-ray contrast/dye. Plan on being at [facility] for 30 minutes or long, depending on the type of exam you are having performed.  This test typically takes 30-45 minutes to complete.  If you have any questions regarding your exam or if you need to reschedule, you may call the CT department at [phone] between the hours of 8:00 am and 5:00 pm,  Monday-Friday.  ________________________________________________________________________

## 2014-08-04 NOTE — Telephone Encounter (Signed)
See alternate note  

## 2014-08-04 NOTE — Telephone Encounter (Signed)
I spoke with Dr Marcello Moores surgery scheduler and came up with 08/18/14 230 pm as a date and time that works for both Dr Ardis Hughs and Dr Marcello Moores.  I put in the case and orders for [facility] and called Sharee Pimple to make her aware and she states that the case and orders are not needed that the scheduler at [facility] will need to call the OR at [facility] and schedule the case.  She made a note that Dr Ardis Hughs would be doing the colon at that date and time and would need the endo cart.  I called the scheduler back and told her what Sharee Pimple stated and the scheduler is calling Dr Marcello Moores to get orders and will then call the OR at [facility] and set the case up.  She will call me back after she has that scheduled.  I will call the pt and give her the instructions for the colon

## 2014-08-05 ENCOUNTER — Telehealth: Payer: Self-pay | Admitting: *Deleted

## 2014-08-05 NOTE — Telephone Encounter (Signed)
Left message on machine to call back for instructions.  Call also placed to [facility] the scheduler at [place] to confirm the procedure   Please call the patient. Let her know the CT scan shows NO sign of spread of the anal cancer in chest, abd or pelvis. Should continue with the suggestions outlined at recent visit.

## 2014-08-05 NOTE — Telephone Encounter (Signed)
Oncology Nurse Navigator Documentation  Oncology Nurse Navigator Flowsheets 08/05/2014  Referral date to RadOnc/MedOnc 08/01/2014  Navigator Encounter Type Introductory phone call for new appointment 08/10/14 @1pm  on 08/10/14 with Dr. Burr Medico and with Dr. Lisbeth Renshaw on 08/12/14 at 0830  Treatment Phase Left VM to call back regarding new appointments

## 2014-08-05 NOTE — Telephone Encounter (Signed)
Pt aware of the procedure and has been instructed, she will get the written instructions from my chart, she will call if [facility] does not contact her with confirmation. Pt also notified of CT results

## 2014-08-05 NOTE — Telephone Encounter (Signed)
Called and left VM with 2nd message to call for appointment. Will also send a My chart message.

## 2014-08-09 ENCOUNTER — Telehealth: Payer: Self-pay | Admitting: *Deleted

## 2014-08-09 ENCOUNTER — Encounter: Payer: Self-pay | Admitting: *Deleted

## 2014-08-09 NOTE — Telephone Encounter (Signed)
Appointment confirmed by patient via email. Called back and left message for her regarding what to bring and valet parking. Dr. Ida Rogue office will be calling her to confirm their appointment with him. Notified Kim in HIM regarding referral.

## 2014-08-09 NOTE — Progress Notes (Signed)
[facility]  [phone] Fax:([phone]) [place] Note   Patient Care Team: Tonia Ghent, MD as PCP - General (Family Medicine) 08/10/2014  CHIEF COMPLAINTS/PURPOSE OF CONSULTATION:  Rectal squamous cell carcinoma  Oncology History   Presented with heme + stool, loose stools for 2 months     Rectal cancer   08/01/2014 Pathology Results Rectum, biopsy, distal mass - SQUAMOUS CELL CARCINOMA, BASALOID TYPE   08/01/2014 Procedure Colonoscopy-ulcerated, malignant appearing distal rectal mass adjacent to the internal anal sphincter. #14 polyps spread throughout the colon (#12 removed)   08/01/2014 Tumor Marker CEA=5.3   08/01/2014 Initial Diagnosis Rectal cancer   08/04/2014 Imaging CT C/A/P-No evidence of metastatic disease in chest, abdomen, or pelvis    HISTORY OF PRESENTING ILLNESS:  Glenda Stevens 69 y.o. female is here because of recently diagnosed rectal squamous cell carcinoma.  She has been having diarrhea for the past 4 months, she has watery bowel movement 1-2 times daily, with some urgency, no blood, associated with abdominal cramps before the bowel movement. She denies any nausea or vomiting. She has good appetite, no weight loss. No fever or chills. She didn't seek medical attention until a month's ago she was in [place] by her primary care physician. Lab CBC and CMP was unremarkable, stool guaiac was positive. She was referred to gastroenterologist Dr. Ardis Hughs, and underwent her first colonoscopy on 08/01/2014. The colonoscopy showed a total of 14 polyps, 12 polyps were removed, and a distal rectal mass adjacent to the internal and no sphincter was found, which was suspicious for malignancy. Biopsy showed squamous cell carcinoma. Staging scan CT showed no evidence of adenopathy or distant metastasis. She was referred to our cancer center to discuss chemotherapy and radiation. She is going to see radiation oncologist Dr. Lisbeth Renshaw this  afternoon.   MEDICAL HISTORY:  Past Medical History  Diagnosis Date  . Renal artery stenosis     left stent in 2003  . Renovascular hypertension   . Hypertension   . Hyperlipidemia   . GERD (gastroesophageal reflux disease)   . Arthritis   . History of chicken pox   . Allergy     Hay fever  . Phlebitis and thrombophlebitis   . Depression   . Colon cancer 08/01/14    retal squamous cell ca    SURGICAL HISTORY: Past Surgical History  Procedure Laterality Date  . Transthoracic echocardiogram  06/2012    EF 55-60%, mild MR  . Nm myocar perf wall motion  09/2009    bruce myoview - normal pattern of perfusion, EF 83%, low risk scan  . Renal doppler  09/2012    left renal artery stent - 60-99% diameter reduction  . Renal artery angioplasty  07/02/2001    6x16mm Genesis on Aviator balloon stent overlapping a 6x40mm Genesist on Aviator balloon stent (Dr. Marella Chimes)  . Nasal sinus surgery    . Abdominal hysterectomy    . Cataract extraction Bilateral     SOCIAL HISTORY: History   Social History  . Marital Status: Divorced    Spouse Name: N/A  . Number of Children: 1  . Years of Education: 16   Occupational History  . Education officer, museum    Social History Main Topics  . Smoking status: Current Every Day Smoker -- 1.00 packs/day for 30 years    Types: Cigarettes  . Smokeless tobacco: Never Used  . Alcohol Use: 4.8 - 5.4 oz/week    7 Standard drinks or equivalent,  1-2 Glasses of wine per week     Comment: daily   . Drug Use: No  . Sexual Activity: Not on file   Other Topics Concern  . Not on file   Social History Narrative   Divorced   1 son, local   Retired from [place] work   [employer] grad    FAMILY HISTORY: Family History  Problem Relation Age of Onset  . Hypertension Mother   . Heart disease Mother   . Dementia Mother   . Heart disease Father   . Hyperlipidemia Father   . Cancer Father     lung cancer and prostate cancer   . Colon cancer Neg Hx   .  Breast cancer Cousin   . Cancer Cousin     4 paternal and 1 maternal cousins had breast cancer     ALLERGIES:  is allergic to crestor.  MEDICATIONS:  Current Outpatient Prescriptions  Medication Sig Dispense Refill  . ALPRAZolam (XANAX PO) Take 0.25 mg by mouth as needed.     Marland Kitchen atenolol (TENORMIN) 50 MG tablet Take 1 tablet (50 mg total) by mouth daily. 90 tablet 3  . atorvastatin (LIPITOR) 80 MG tablet Take 0.5 tablets (40 mg total) by mouth daily. (Patient not taking: Reported on 08/10/2014)    . candesartan (ATACAND) 32 MG tablet Take 1 tablet (32 mg total) by mouth daily. Need appointment before anymore refills 90 tablet 0  . Cetirizine HCl (ZYRTEC PO) Take 10 mg by mouth as needed. Takes 1/2 tab    . Multiple Vitamin (MULTIVITAMIN) tablet Take 1 tablet by mouth daily.    . ranitidine (ZANTAC) 150 MG tablet Take 150 mg by mouth daily as needed for heartburn.    . sertraline (ZOLOFT) 100 MG tablet Take 100 mg by mouth 2 (two) times daily.     No current facility-administered medications for this visit.    REVIEW OF SYSTEMS:   Constitutional: Denies fevers, chills or abnormal night sweats Eyes: Denies blurriness of vision, double vision or watery eyes Ears, nose, mouth, throat, and face: Denies mucositis or sore throat Respiratory: Denies cough, dyspnea or wheezes Cardiovascular: Denies palpitation, chest discomfort or lower extremity swelling Gastrointestinal:  Denies nausea, heartburn or change in bowel habits Skin: Denies abnormal skin rashes Lymphatics: Denies new lymphadenopathy or easy bruising Neurological:Denies numbness, tingling or new weaknesses Behavioral/Psych: Mood is stable, no new changes  All other systems were reviewed with the patient and are negative.  PHYSICAL EXAMINATION: ECOG PERFORMANCE STATUS: 1 - Symptomatic but completely ambulatory  Filed Vitals:   08/10/14 1319  BP: 172/84  Pulse: 77  Temp: 97.6 F (36.4 C)  Resp: 19   Filed Weights    08/10/14 1319  Weight: 152 lb 12.8 oz (69.31 kg)    GENERAL:alert, no distress and comfortable SKIN: skin color, texture, turgor are normal, no rashes or significant lesions EYES: normal, conjunctiva are pink and non-injected, sclera clear OROPHARYNX:no exudate, no erythema and lips, buccal mucosa, and tongue normal  NECK: supple, thyroid normal size, non-tender, without nodularity LYMPH:  no palpable lymphadenopathy in the cervical, axillary or bilaterla inguinal LUNGS: clear to auscultation and percussion with normal breathing effort HEART: regular rate & rhythm and no murmurs and no lower extremity edema ABDOMEN:abdomen soft, non-tender and normal bowel sounds. Rect exam showed a palpable mass at the low rectum area, no overt bleeding. Perianal area no lesions.   Musculoskeletal:no cyanosis of digits and no clubbing  PSYCH: alert & oriented x 3  with fluent speech NEURO: no focal motor/sensory deficits  LABORATORY DATA:  CBC Latest Ref Rng 07/07/2014  WBC 4.0 - 10.5 K/uL 8.0  Hemoglobin 12.0 - 15.0 g/dL 14.9  Hematocrit 36.0 - 46.0 % 43.6  Platelets 150.0 - 400.0 K/uL 262.0    CMP Latest Ref Rng 08/01/2014 07/07/2014 03/16/2014  Glucose 70 - 99 mg/dL - 91 108(H)  BUN 6 - 23 mg/dL - 8 9  Creatinine 0.40 - 1.20 mg/dL 0.62 0.67 0.7  Sodium 135 - 145 mEq/L - 133(L) 135  Potassium 3.5 - 5.1 mEq/L - 4.2 4.0  Chloride 96 - 112 mEq/L - 100 102  CO2 19 - 32 mEq/L - 27 29  Calcium 8.4 - 10.5 mg/dL - 9.6 9.3  Total Protein 6.0 - 8.3 g/dL - 6.9 7.0  Total Bilirubin 0.2 - 1.2 mg/dL - 0.4 0.5  Alkaline Phos 39 - 117 U/L - 53 56  AST 0 - 37 U/L - 26 26  ALT 0 - 35 U/L - 21 23    PATHOLOGY REPORT: Diagnosis 08/01/2014  1. Colon, biopsy, ascending and cecum, polyp (7) - TUBULAR ADENOMAS (7). NO HIGH GRADE DYSPLASIA OR MALIGNANCY IDENTIFIED. 2. Colon, biopsy, hepatic flexure, polyp (3) - TUBULAR ADENOMAS (3). NO HIGH GRADE DYSPLASIA OR MALIGNANCY IDENTIFIED. 3. Colon, biopsy, transverse -  TWO FRAGMENTS OF TUBULAR ADENOMA. NO HIGH GRADE DYSPLASIA OR MALIGNANCY IDENTIFIED. 4. Colon, biopsy, descending, sigmoid - TUBULAR ADENOMAS (3). NO HIGH GRADE DYSPLASIA OR MALIGNANCY IDENTIFIED. 5. Rectum, biopsy, distal mass - SQUAMOUS CELL CARCINOMA, BASALOID TYPE. - SEE MICROSCOPIC DESCRIPTION.  RADIOGRAPHIC STUDIES:  Ct Chest, Abdomen Pelvis W Contrast 08/04/2014   IMPRESSION: 1. No acute process or evidence of metastatic disease in the chest, abdomen, or pelvis. 2. Mild motion degradation involving the abdomen and upper pelvis. 3. Advanced atherosclerosis, including within the coronary arteries.   Electronically Signed   By: Abigail Miyamoto M.D.   On: 08/04/2014 16:42   Colonoscopy 08/01/2014 They were adjacent to eachother. They were located on both sides of a fold, sessile, slightly firm. The largest of the two (62mm) was biopsied and then the site was labled with SPOT via submucosal injection. These biopsies were in jar #3. The other 12 polyp were all sessile, located in cecum, ascending, hepatic flexure, descending and sigmoid. Two were removed with snare cautery (ascending and sigmoid), and the rest removed with cold snare. Sent in three different jars (#1 ascending, cecum), (#2hepatic flexure), (#4 descending, sigmoid). There was also an ulcerated, malignant appearing distal rectal mass that was adjacent to the internal anal sphincter. This was non-cirumferential, 3-5cm across, friable, biopsied extensively (#5). There were 8-10 diminutive polyps spread throughout the colon that I did not remove (2-64mm each). Retroflexed views revealed no abnormalities. The time to cecum = 60min Withdrawal time = 47min The scope was withdrawn and the procedure completed.  COMPLICATIONS: There were no immediate complications.  ENDOSCOPIC IMPRESSION: Multiple sites of significant neoplasia, see above   ASSESSMENT & PLAN:  69 year old Caucasian female with past medical history of hypertension, anxiety and  depression, mild arthritis, who presents with watery diarrhea for 4 months.  1. Rectal squamous cell carcinoma -I reviewed her colonoscopy findings, the rectal mass biopsy results with patient and her son in great details. -We reviewed the natural history of rectal cancer, and staging workup. She is going to have EUS for tumor staging, CT scans reviewed no local adenopathy or distant metastasis. I think she most likely have early stage rectal cancer. -Giving the  tumor location in the low rectum, and squamous cell histology, I think concurrent chemoradiation is favored. I would recommend 5-FU and mitomycin chemotherapy with concurrent radiation, on day 1-5, and day 28-32, similar to anal cancer regimen. -Potential side effects of chemotherapy were discussed with patient, written material of the chemotherapy agents was given to the patient. -I'll wait until he she has the EUS done next Thursday 6/9, and finalized her treatment plan with Drs.Marcello Moores and [place]. If we decides to proceed with concurrent chemoradiation, I'll schedule her to have chemotherapy class and PICC line placement afterwards.  2. Smoking cessation -We discussed the smoking cessation extensively today, she has tried quit smoking a few times in the past and failed. -She states she is not ready to quit smoking, when she is going through the cancer diagnosis and treatment. She'll consider after she completes all treatments.  3. Hypertension, anxiety -She'll continue follow-up with her primary care physician  Plan -I'll call her after her [place] on June 9. If decision is to proceed with chemotherapy radiation, I'll arrange chemotherapy class and PICC line placement.  All questions were answered. The patient knows to call the clinic with any problems, questions or concerns. I spent 55 minutes counseling the patient face to face. The total time spent in the appointment was 60 minutes and more than 50% was on counseling.     Truitt Merle,  MD 08/10/2014 3:24 PM

## 2014-08-10 ENCOUNTER — Encounter (HOSPITAL_COMMUNITY): Payer: Self-pay | Admitting: Anesthesiology

## 2014-08-10 ENCOUNTER — Ambulatory Visit
Admission: RE | Admit: 2014-08-10 | Discharge: 2014-08-10 | Disposition: A | Discharge status: Home / Self Care | Payer: Medicare Other | Source: Ambulatory Visit | Attending: Radiation Oncology | Admitting: Radiation Oncology

## 2014-08-10 ENCOUNTER — Encounter: Payer: Self-pay | Admitting: Hematology

## 2014-08-10 ENCOUNTER — Encounter: Payer: Self-pay | Admitting: Radiation Oncology

## 2014-08-10 ENCOUNTER — Ambulatory Visit (HOSPITAL_BASED_OUTPATIENT_CLINIC_OR_DEPARTMENT_OTHER): Payer: Medicare Other | Admitting: Hematology

## 2014-08-10 ENCOUNTER — Other Ambulatory Visit: Payer: Self-pay | Admitting: *Deleted

## 2014-08-10 VITALS — BP 166/82 | HR 67 | Temp 98.1°F | Resp 20 | Wt 153.3 lb

## 2014-08-10 VITALS — BP 172/84 | HR 77 | Temp 97.6°F | Resp 19 | Ht 62.0 in | Wt 152.8 lb

## 2014-08-10 DIAGNOSIS — C20 Malignant neoplasm of rectum: Secondary | ICD-10-CM | POA: Diagnosis not present

## 2014-08-10 DIAGNOSIS — C210 Malignant neoplasm of anus, unspecified: Secondary | ICD-10-CM | POA: Insufficient documentation

## 2014-08-10 DIAGNOSIS — Z720 Tobacco use: Secondary | ICD-10-CM

## 2014-08-10 HISTORY — DX: Malignant neoplasm of colon, unspecified: C18.9

## 2014-08-10 NOTE — [facility] Oncology Navigator Note (Signed)
Oncology Nurse Navigator Documentation  Oncology Nurse Navigator Flowsheets 08/05/2014 08/10/2014  Referral date to RadOnc/MedOnc 08/01/2014 -  Navigator Encounter Type Introductory phone call Initial MedOnc  Patient Visit Type - Medonc  Treatment Phase Other Tx planning  Barriers/Navigation Needs - Education  Education - Understanding Cancer/ Treatment Options, PICC line. Met with patient and son during new patient visit. Explained the role of the GI Nurse Navigator and provided New Patient Packet with information on: 1.  Colorectal cancer 2. Support groups 3. Advanced Directives 4. Fall Safety Plan Answered questions, reviewed current treatment plan using TEACH back and provided emotional support. Provided copy of current treatment plan.   Referrals - Nutrition/dietician  Education Method - Verbal, printed and Teach Back  Support Groups/Services - GI  Time Spent with Patient - 38

## 2014-08-10 NOTE — Progress Notes (Signed)
GI Location of Tumor / Histology: Rectal Cancer,Squamous cell carcinoma   Helayne Metsker presented  months ago with symptoms of: Diarrhea, past few months, burning,urgency  Biopsies of  (if applicable) revealed: Diagnosis : 08/01/14: 1. Colon, biopsy, ascending and cecum, polyp (7)- TUBULAR ADENOMAS (7). NO HIGH GRADE DYSPLASIA OR MALIGNANCY IDENTIFIED. 2. Colon, biopsy, hepatic flexure, polyp (3)- TUBULAR ADENOMAS (3). NO HIGH GRADE DYSPLASIA OR MALIGNANCY IDENTIFIED. 3. Colon, biopsy, transverse- TWO FRAGMENTS OF TUBULAR ADENOMA. NO HIGH GRADE DYSPLASIA OR MALIGNANCY IDENTIFIED. 4. Colon, biopsy, descending, sigmoid- TUBULAR ADENOMAS (3). NO HIGH GRADE DYSPLASIA OR MALIGNANCY IDENTIFIED. 5. Rectum, biopsy, distal mass- SQUAMOUS CELL CARCINOMA, BASALOID TYPE.  Past/Anticipated interventions by surgeon, if any:   Past/Anticipated interventions by medical oncology, if any: seen by Dr. Burr Medico 08/10/14   Weight changes, if any: stable 2 lbs down  Bowel/Bladder complaints, if any: Diarrhea,loose stools, takes imodium ad prn  Nausea / Vomiting, if any: No  Pain issues, if any:  Soreness in rectum  SAFETY ISSUES:  Prior radiation? NO  Pacemaker/ICD? NO  Possible current pregnancy? NO  Is the patient on methotrexate? NO  Current Complaints / other details:Divorced 1 son,Depression Current cigarette smoker,1ppd 30 years,  Father Lung cancer, heart disease,Mother dementia, heart disease, Cousin breast cancer,  Allergies:Hay fever, Crestor-intolerance BP 166/82 mmHg  Pulse 67  Temp(Src) 98.1 F (36.7 C) (Oral)  Resp 20  Wt 153 lb 4.8 oz (69.536 kg)  Wt Readings from Last 3 Encounters:  08/10/14 153 lb 4.8 oz (69.536 kg)  08/10/14 152 lb 12.8 oz (69.31 kg)  08/01/14 155 lb (70.308 kg)   3:23 PM

## 2014-08-10 NOTE — Progress Notes (Signed)
Please see the Nurse Progress Note in the MD Initial Consult Encounter for this patient. 

## 2014-08-10 NOTE — Progress Notes (Addendum)
Radiation Oncology         [phone]) [phone] ________________________________  Name: Glenda Stevens MRN: [id number]  Date: 08/10/2014  DOB: Jan 25, 1946  UR:[facility] Damita Dunnings, MD  Tonia Ghent, MD     REFERRING PHYSICIAN: Tonia Ghent, MD   DIAGNOSIS: The encounter diagnosis was Rectal cancer.   HISTORY OF PRESENT ILLNESS::Glenda Stevens is a 69 y.o. female who is seen for an initial consultation visit. The patient describes a history of urgent, burning, diarrhea for the past few months with no blood. The guaiac test came back positive. The pt also has soreness of the rectum after the biopsy. She takes imodium prn for the diarrhea. The pt denies nausea and vomiting. She saw Dr. Burr Medico earlier today.  Evaluation of the patient has revealed a low rectal tumor suspicious for malignancy. Numerous additional polyps were seen in the colon, but no other biopsies showed cancer. One large tumor in the colon was recommended for removal, but was too large for removal during the colonoscopy. A biopsy was performed and the pathology revealed squamous cell carcinoma.  CT imaging for staging purposes no evidence of regional or distant disease. A PET scan has not been ordered yet.  Oncology History   Presented with heme + stool, loose stools for 2 months     Rectal cancer   08/01/2014 Pathology Results Rectum, biopsy, distal mass - SQUAMOUS CELL CARCINOMA, BASALOID TYPE   08/01/2014 Procedure Colonoscopy-ulcerated, malignant appearing distal rectal mass adjacent to the internal anal sphincter. #14 polyps spread throughout the colon (#12 removed)   08/01/2014 Tumor Marker CEA=5.3   08/01/2014 Initial Diagnosis Rectal cancer   08/04/2014 Imaging CT C/A/P-No evidence of metastatic disease in chest, abdomen, or pelvis     PREVIOUS RADIATION THERAPY: No   PAST MEDICAL HISTORY:  has a past medical history of Renal artery stenosis; Renovascular hypertension; Hypertension; Hyperlipidemia; GERD (gastroesophageal reflux  disease); Arthritis; History of chicken pox; Allergy; Phlebitis and thrombophlebitis; Depression; and Colon cancer (08/01/14).     PAST SURGICAL HISTORY: Past Surgical History  Procedure Laterality Date  . Transthoracic echocardiogram  06/2012    EF 55-60%, mild MR  . Nm myocar perf wall motion  09/2009    bruce myoview - normal pattern of perfusion, EF 83%, low risk scan  . Renal doppler  09/2012    left renal artery stent - 60-99% diameter reduction  . Renal artery angioplasty  07/02/2001    6x53mm Genesis on Aviator balloon stent overlapping a 6x72mm Genesist on Aviator balloon stent (Dr. Marella Chimes)  . Nasal sinus surgery    . Abdominal hysterectomy    . Cataract extraction Bilateral      FAMILY HISTORY: family history includes Breast cancer in her cousin; Cancer in her cousin and father; Dementia in her mother; Heart disease in her father and mother; Hyperlipidemia in her father; Hypertension in her mother. There is no history of Colon cancer.   SOCIAL HISTORY:  reports that she has been smoking Cigarettes.  She has a 30 pack-year smoking history. She has never used smokeless tobacco. She reports that she drinks about 4.8 - 5.4 oz of alcohol per week. She reports that she does not use illicit drugs.   ALLERGIES: Crestor   MEDICATIONS:  Current Outpatient Prescriptions  Medication Sig Dispense Refill  . ALPRAZolam (XANAX PO) Take 0.25 mg by mouth as needed.     Marland Kitchen atenolol (TENORMIN) 50 MG tablet Take 1 tablet (50 mg total) by mouth daily. 90 tablet 3  .  candesartan (ATACAND) 32 MG tablet Take 1 tablet (32 mg total) by mouth daily. Need appointment before anymore refills 90 tablet 0  . Cetirizine HCl (ZYRTEC PO) Take 10 mg by mouth as needed. Takes 1/2 tab    . Multiple Vitamin (MULTIVITAMIN) tablet Take 1 tablet by mouth daily.    . ranitidine (ZANTAC) 150 MG tablet Take 150 mg by mouth daily as needed for heartburn.    . sertraline (ZOLOFT) 100 MG tablet Take 100 mg by mouth 2  (two) times daily.    Marland Kitchen atorvastatin (LIPITOR) 80 MG tablet Take 0.5 tablets (40 mg total) by mouth daily. (Patient not taking: Reported on 08/10/2014)     No current facility-administered medications for this encounter.     REVIEW OF SYSTEMS:  A 15 point review of systems is documented in the electronic medical record. This was obtained by the nursing staff. However, I reviewed this with the patient to discuss relevant findings and make appropriate changes.  Pertinent items are noted in HPI.    PHYSICAL EXAM:  weight is 153 lb 4.8 oz (69.536 kg). Her oral temperature is 98.1 F (36.7 C). Her blood pressure is 166/82 and her pulse is 67. Her respiration is 20.   General: Well-developed, in no acute distress HEENT: Normocephalic, atraumatic; oral cavity clear Neck: Supple without any lymphadenopathy Cardiovascular: Regular rate and rhythm Respiratory: Clear to auscultation bilaterally GI: Soft, nontender, normal bowel sounds Extremities: No edema present Neuro: No focal deficits Rectal exam:  Firm tumor is present in the anal canal extending to the distal rectum on the patient's right.  LABORATORY DATA:  Lab Results  Component Value Date   WBC 8.0 07/07/2014   HGB 14.9 07/07/2014   HCT 43.6 07/07/2014   MCV 90.1 07/07/2014   PLT 262.0 07/07/2014   Lab Results  Component Value Date   NA 133* 07/07/2014   K 4.2 07/07/2014   CL 100 07/07/2014   CO2 27 07/07/2014   Lab Results  Component Value Date   ALT 21 07/07/2014   AST 26 07/07/2014   ALKPHOS 53 07/07/2014   BILITOT 0.4 07/07/2014      RADIOGRAPHY: Ct Chest W Contrast  08/04/2014   CLINICAL DATA:  Newly diagnosed squamous cell carcinoma of the anus. Multiple colonic polyps. History of hyperlipidemia. Gastroesophageal reflux disease.  EXAM: CT CHEST, ABDOMEN, AND PELVIS WITH CONTRAST  TECHNIQUE: Multidetector CT imaging of the chest, abdomen and pelvis was performed following the standard protocol during bolus  administration of intravenous contrast.  CONTRAST:  100 cc of Omnipaque 300  COMPARISON:  None.  FINDINGS: CT CHEST FINDINGS  Mediastinum/Nodes: No supraclavicular adenopathy. Aortic and branch vessel atherosclerosis. Mild cardiomegaly. LAD and right coronary artery atherosclerosis. No pericardial effusion. No central pulmonary embolism, on this non-dedicated study. No mediastinal or hilar adenopathy.  Lungs/Pleura: No pleural fluid. Dependent bibasilar atelectasis. No pulmonary nodule or consolidation.  Musculoskeletal: No acute osseous abnormality.  CT ABDOMEN PELVIS FINDINGS  Hepatobiliary: Normal liver. Normal gallbladder, without biliary ductal dilatation.  Pancreas: Normal, without mass or ductal dilatation.  Spleen: Normal  Adrenals/Urinary Tract: Normal adrenal glands. Normal kidneys, without hydronephrosis. Normal urinary bladder.  Stomach/Bowel: Underdistended proximal stomach. No dominant anal primary identified. Normal perianal and perirectal fat planes. No bowel obstruction. Mild motion degradation in the lower abdomen and pelvis. Cecum extends into the right central pelvis. Normal terminal ileum. Appendix may be diminutive on image 105. No surrounding inflammation. Normal small bowel.  Vascular/Lymphatic: Advanced atherosclerosis. Left renal artery stent.  No abdominopelvic adenopathy.  Reproductive: Hysterectomy.  No adnexal mass.  Other: No significant free fluid. No evidence of omental or peritoneal disease.  Musculoskeletal: No acute osseous abnormality. L4-5 and L3-4 disc bulges.  IMPRESSION: 1. No acute process or evidence of metastatic disease in the chest, abdomen, or pelvis. 2. Mild motion degradation involving the abdomen and upper pelvis. 3. Advanced atherosclerosis, including within the coronary arteries.   Electronically Signed   By: Abigail Miyamoto M.D.   On: 08/04/2014 16:42   Ct Abdomen Pelvis W Contrast  08/04/2014   CLINICAL DATA:  Newly diagnosed squamous cell carcinoma of the anus.  Multiple colonic polyps. History of hyperlipidemia. Gastroesophageal reflux disease.  EXAM: CT CHEST, ABDOMEN, AND PELVIS WITH CONTRAST  TECHNIQUE: Multidetector CT imaging of the chest, abdomen and pelvis was performed following the standard protocol during bolus administration of intravenous contrast.  CONTRAST:  100 cc of Omnipaque 300  COMPARISON:  None.  FINDINGS: CT CHEST FINDINGS  Mediastinum/Nodes: No supraclavicular adenopathy. Aortic and branch vessel atherosclerosis. Mild cardiomegaly. LAD and right coronary artery atherosclerosis. No pericardial effusion. No central pulmonary embolism, on this non-dedicated study. No mediastinal or hilar adenopathy.  Lungs/Pleura: No pleural fluid. Dependent bibasilar atelectasis. No pulmonary nodule or consolidation.  Musculoskeletal: No acute osseous abnormality.  CT ABDOMEN PELVIS FINDINGS  Hepatobiliary: Normal liver. Normal gallbladder, without biliary ductal dilatation.  Pancreas: Normal, without mass or ductal dilatation.  Spleen: Normal  Adrenals/Urinary Tract: Normal adrenal glands. Normal kidneys, without hydronephrosis. Normal urinary bladder.  Stomach/Bowel: Underdistended proximal stomach. No dominant anal primary identified. Normal perianal and perirectal fat planes. No bowel obstruction. Mild motion degradation in the lower abdomen and pelvis. Cecum extends into the right central pelvis. Normal terminal ileum. Appendix may be diminutive on image 105. No surrounding inflammation. Normal small bowel.  Vascular/Lymphatic: Advanced atherosclerosis. Left renal artery stent. No abdominopelvic adenopathy.  Reproductive: Hysterectomy.  No adnexal mass.  Other: No significant free fluid. No evidence of omental or peritoneal disease.  Musculoskeletal: No acute osseous abnormality. L4-5 and L3-4 disc bulges.  IMPRESSION: 1. No acute process or evidence of metastatic disease in the chest, abdomen, or pelvis. 2. Mild motion degradation involving the abdomen and upper  pelvis. 3. Advanced atherosclerosis, including within the coronary arteries.   Electronically Signed   By: Abigail Miyamoto M.D.   On: 08/04/2014 16:42    IMPRESSION:   Anal cancer   Staging form: Colon and Rectum, AJCC 7th Edition     Clinical: Stage I (T2, N0, M0) - Unsigned  The patient has a diagnosis of squamous cell carcinoma of the anus. Staging studies at this time reveal no evidence of any additional disease beyond the rectal tumor. Further workup includes scheduling a PET scan.  Notable aspects of the case include the fact that the tumor is located in the distal rectum/upper anal canal.  Based on the current information available, the patient appears to be an appropriate candidate for definitve chemoradiotherapy. I therefore discussed a potential 5 1/2 - 6 week course of radiation with the patient. We discussed the rationale of this treatment, including the expected potential benefit. We also discussed the potential side effects and risks of such a treatment as well. All of her questions were answered. The patient signed a consent form and this was placed into the patient's chart.   PLAN: The patient has a surgery on 08/18/14 for the removal of a colon polyp. Simulation in the near future such that we can proceed with treatment planning.  At this time, I anticipate beginning the patient's treatment on 08/29/14.   I spent 60 minutes face to face with the patient and more than 50% of that time was spent in counseling and/or coordination of care.  This document serves as a record of services personally performed by Kyung Rudd, MD. It was created on his behalf by Darcus Austin, a trained medical scribe. The creation of this record is based on the scribe's personal observations and the provider's statements to them. This document has been checked and approved by the attending provider.     ________________________________  Jodelle Gross, MD, PhD

## 2014-08-11 ENCOUNTER — Ambulatory Visit (HOSPITAL_COMMUNITY): Admission: RE | Admit: 2014-08-11 | Payer: Medicare Other | Source: Ambulatory Visit | Admitting: Gastroenterology

## 2014-08-11 ENCOUNTER — Encounter (HOSPITAL_COMMUNITY): Admission: RE | Payer: Self-pay | Source: Ambulatory Visit

## 2014-08-11 ENCOUNTER — Telehealth: Payer: Self-pay | Admitting: Hematology

## 2014-08-11 SURGERY — COLONOSCOPY
Anesthesia: Monitor Anesthesia Care

## 2014-08-11 NOTE — Telephone Encounter (Signed)
Scheduled Nut class with pt mailed out schedule to pt.... Glenda Stevens

## 2014-08-12 ENCOUNTER — Ambulatory Visit: Payer: Medicare Other | Admitting: Radiation Oncology

## 2014-08-12 ENCOUNTER — Other Ambulatory Visit: Payer: Self-pay | Admitting: Radiation Oncology

## 2014-08-12 ENCOUNTER — Telehealth: Payer: Self-pay | Admitting: *Deleted

## 2014-08-12 ENCOUNTER — Encounter: Payer: Self-pay | Admitting: *Deleted

## 2014-08-12 ENCOUNTER — Ambulatory Visit
Admission: RE | Admit: 2014-08-12 | Discharge: 2014-08-12 | Disposition: A | Discharge status: Home / Self Care | Payer: Medicare Other | Source: Ambulatory Visit | Attending: Radiation Oncology | Admitting: Radiation Oncology

## 2014-08-12 DIAGNOSIS — C20 Malignant neoplasm of rectum: Secondary | ICD-10-CM | POA: Diagnosis not present

## 2014-08-12 DIAGNOSIS — C210 Malignant neoplasm of anus, unspecified: Secondary | ICD-10-CM

## 2014-08-12 NOTE — Progress Notes (Signed)
[facility] Psychosocial Distress Screening Clinical Social Work  Clinical Social Work was referred by distress screening protocol.  The patient scored a 5 on the Psychosocial Distress Thermometer which indicates moderate distress. Clinical Social Worker phoned pt to assess for distress and other psychosocial needs. CSW left message with pt to return CSW's call.   ONCBCN DISTRESS SCREENING 08/10/2014  Screening Type Initial Screening  Distress experienced in past week (1-10) 5  Emotional problem type Nervousness/Anxiety;Adjusting to illness  Physical Problem type Constipation/diarrhea  Physician notified of physical symptoms Yes  Referral to clinical social work Yes   Clinical Social Worker follow up needed: No.  If yes, follow up plan: Loren Racer, [place]  [facility] Phone: [phone] Fax: [phone]

## 2014-08-12 NOTE — Telephone Encounter (Signed)
CALLED PATIENT TO INFORM OF PET SCAN ON 08/17/14 @ 2 PM @ [facility], LVM FOR A RETURN CALL

## 2014-08-15 ENCOUNTER — Other Ambulatory Visit: Payer: Self-pay | Admitting: General Surgery

## 2014-08-15 NOTE — H&P (Signed)
History of Present Illness Leighton Ruff MD; 4/0/9811 5:07 PM) Patient words: discuss surgery.  The patient is a 69 year old female who presents with a colonic polyp. 69 year old female who underwent colonoscopy by Dr. Ardis Hughs earlier this month. She was noted to have an anal mass which is tested positive for anus squamous cell carcinoma. Metastatic workup shows no other signs of distant disease. Of note on colonoscopy she was noted to have a transverse colon polyp that was unresectable at that time. I was asked by Dr. Ardis Hughs to consider laparoscopic assistance of the transverse colon polyp so that she could proceed to chemotherapy and radiation for anal cancer. Other Problems Mammie Lorenzo, LPN; 11/09/4780 9:56 PM) Anxiety Disorder Colon Cancer High blood pressure Hypercholesterolemia  Past Surgical History Mammie Lorenzo, LPN; 04/11/3084 5:78 PM) Breast Biopsy Bilateral. Cataract Surgery Bilateral. Hysterectomy (not due to cancer) - Complete  Diagnostic Studies History Mammie Lorenzo, LPN; 06/15/9627 5:28 PM) Colonoscopy within last year Mammogram never Pap Smear >5 years ago  Allergies Mammie Lorenzo, LPN; 06/10/3242 0:10 PM) Crestor *ANTIHYPERLIPIDEMICS*  Medication History Mammie Lorenzo, LPN; 04/17/2534 6:44 PM) ALPRAZolam (0.25MG  Tablet, Oral) Active. Atenolol (50MG  Tablet, Oral) Active. Candesartan Cilexetil (32MG  Tablet, Oral) Active. Sertraline HCl (100MG  Tablet, Oral) Active. ZyrTEC Childrens Allergy (10MG  Tablet Chewable, Oral) Active. Zantac 150 Maximum Strength (150MG  Tablet, Oral) Active. Multivitamins (Oral) Active. Medications Reconciled  Social History Mammie Lorenzo, LPN; 0/05/4740 5:95 PM) Alcohol use Moderate alcohol use. Caffeine use Carbonated beverages, Tea. No drug use Tobacco use Current every day smoker.  Family History Mammie Lorenzo, LPN; 08/12/8754 4:33 PM) Heart Disease Father, Mother. Hypertension Mother, Son. Prostate  Cancer Father. Respiratory Condition Father.  Pregnancy / Birth History Mammie Lorenzo, LPN; 04/19/5186 4:16 PM) Age at menarche 50 years. Age of menopause <43 Gravida 1 Irregular periods Maternal age 32-25 Para 1     Review of Systems Mammie Lorenzo LPN; 6/0/6301 6:01 PM) General Not Present- Appetite Loss, Chills, Fatigue, Fever, Night Sweats, Weight Gain and Weight Loss. Skin Not Present- Change in Wart/Mole, Dryness, Hives, Jaundice, New Lesions, Non-Healing Wounds, Rash and Ulcer. HEENT Present- Seasonal Allergies and Wears glasses/contact lenses. Not Present- Earache, Hearing Loss, Hoarseness, Nose Bleed, Oral Ulcers, Ringing in the Ears, Sinus Pain, Sore Throat, Visual Disturbances and Yellow Eyes. Respiratory Present- Snoring. Not Present- Bloody sputum, Chronic Cough, Difficulty Breathing and Wheezing. Breast Not Present- Breast Mass, Breast Pain, Nipple Discharge and Skin Changes. Cardiovascular Not Present- Chest Pain, Difficulty Breathing Lying Down, Leg Cramps, Palpitations, Rapid Heart Rate, Shortness of Breath and Swelling of Extremities. Gastrointestinal Present- Abdominal Pain, Chronic diarrhea and Indigestion. Not Present- Bloating, Bloody Stool, Change in Bowel Habits, Constipation, Difficulty Swallowing, Excessive gas, Gets full quickly at meals, Hemorrhoids, Nausea, Rectal Pain and Vomiting. Female Genitourinary Not Present- Frequency, Nocturia, Painful Urination, Pelvic Pain and Urgency. Musculoskeletal Not Present- Back Pain, Joint Pain, Joint Stiffness, Muscle Pain, Muscle Weakness and Swelling of Extremities. Neurological Not Present- Decreased Memory, Fainting, Headaches, Numbness, Seizures, Tingling, Tremor, Trouble walking and Weakness. Psychiatric Not Present- Anxiety, Bipolar, Change in Sleep Pattern, Depression, Fearful and Frequent crying. Endocrine Not Present- Cold Intolerance, Excessive Hunger, Hair Changes, Heat Intolerance, Hot flashes and New  Diabetes. Hematology Not Present- Easy Bruising, Excessive bleeding, Gland problems, HIV and Persistent Infections.  Vitals Claiborne Billings Dockery LPN; 0/11/3233 5:73 PM) 08/15/2014 4:55 PM Weight: 152.4 lb Height: 62.5in Body Surface Area: 1.75 m Body Mass Index: 27.43 kg/m Temp.: 98.57F(Oral)  Pulse: 88 (Regular)  BP: 128/86 (Sitting, Left Arm, Standard)  Physical Exam Leighton Ruff MD; 06/10/7406 5:08 PM)  General Mental Status-Alert. General Appearance-Consistent with stated age. Hydration-Well hydrated. Voice-Normal.  Head and Neck Head-normocephalic, atraumatic with no lesions or palpable masses. Trachea-midline. Thyroid Gland Characteristics - normal size and consistency.  Eye Eyeball - Bilateral-Extraocular movements intact. Sclera/Conjunctiva - Bilateral-No scleral icterus.  Chest and Lung Exam Chest and lung exam reveals -quiet, even and easy respiratory effort with no use of accessory muscles and on auscultation, normal breath sounds, no adventitious sounds and normal vocal resonance. Inspection Chest Wall - Normal. Back - normal.  Cardiovascular Cardiovascular examination reveals -normal heart sounds, regular rate and rhythm with no murmurs and normal pedal pulses bilaterally.  Abdomen Inspection Inspection of the abdomen reveals - No Hernias. Skin - Scar - Note: Well-healed Pfannenstiel incision. Palpation/Percussion Palpation and Percussion of the abdomen reveal - Soft, Non Tender, No Rebound tenderness, No Rigidity (guarding) and No hepatosplenomegaly. Auscultation Auscultation of the abdomen reveals - Bowel sounds normal.  Neurologic Neurologic evaluation reveals -alert and oriented x 3 with no impairment of recent or remote memory. Mental Status-Normal.  Musculoskeletal Normal Exam - Left-Upper Extremity Strength Normal and Lower Extremity Strength Normal. Normal Exam - Right-Upper Extremity Strength Normal and  Lower Extremity Strength Normal.    Assessment & Plan Leighton Ruff MD; 03/14/4816 5:09 PM)  POLYP OF TRANSVERSE COLON (211.3  D12.3) Impression: 69 year old female with anal squamous cell carcinoma and a polyp in the transverse colon. We will attempt to perform a laparoscopic-assisted endoscopically resected colon polyp. This will be scheduled with Dr. Ardis Hughs. We discussed the risk of the procedure which are bleeding, infection, damage to adjacent structures and need for additional surgery. I think all of these risks are very minimal. If there was damage to the colon after endoscopic resection, we will plan to perform primary repair during the surgery.

## 2014-08-16 ENCOUNTER — Telehealth: Payer: Self-pay | Admitting: Gastroenterology

## 2014-08-16 ENCOUNTER — Telehealth: Payer: Self-pay | Admitting: Acute Care

## 2014-08-16 ENCOUNTER — Other Ambulatory Visit (HOSPITAL_COMMUNITY): Payer: Self-pay | Admitting: Anesthesiology

## 2014-08-16 NOTE — Telephone Encounter (Signed)
Pt Miralax  instructions have been added to the chart, Movi instructions were used but should have been miralax.  Pt aware and confirmed she does have the miralax instructions to follow.

## 2014-08-16 NOTE — Patient Instructions (Addendum)
Glenda Stevens  08/16/2014   Your procedure is scheduled on: Thursday 08/18/2014  Report to [facility] Main  Entrance and follow signs to               [name] at 1230 PM.  Call this number if you have problems the morning of surgery [phone]   Remember: ONLY 1 PERSON MAY GO WITH YOU TO SHORT STAY TO GET  READY MORNING OF [name]!    Do not eat food  :After Midnight. MAY HAVE CLEAR LIQUIDS FROM MIDNIGHT UP UNTIL 0830 AM THEN NOTHING UNTIL AFTER SURGERY!     Take these medicines the morning of surgery with A SIP OF WATER:  ZOLOFT                               You may not have any metal on your body including hair pins and              piercings  Do not wear jewelry, make-up, lotions, powders or perfumes, deodorant             Do not wear nail polish.  Do not shave  48 hours prior to surgery.              Men may shave face and neck.   Do not bring valuables to the hospital. [name].  Contacts, dentures or bridgework may not be worn into surgery.  Leave suitcase in the car. After surgery it may be brought to your room.     Patients discharged the day of surgery will not be allowed to drive home.  Name and phone number of your driver:  Special Instructions: N/A              Please read over the following fact sheets you were given: _____________________________________________________________________             [facility] - Preparing for Surgery Before surgery, you can play an important role.  Because skin is not sterile, your skin needs to be as free of germs as possible.  You can reduce the number of germs on your skin by washing with CHG (chlorahexidine gluconate) soap before surgery.  CHG is an antiseptic cleaner which kills germs and bonds with the skin to continue killing germs even after washing. Please DO NOT use if you have an allergy to CHG or antibacterial soaps.  If your skin  becomes reddened/irritated stop using the CHG and inform your nurse when you arrive at Short Stay. Do not shave (including legs and underarms) for at least 48 hours prior to the first CHG shower.  You may shave your face/neck. Please follow these instructions carefully:  1.  Shower with CHG Soap the night before surgery and the  morning of Surgery.  2.  If you choose to wash your hair, wash your hair first as usual with your  normal  shampoo.  3.  After you shampoo, rinse your hair and body thoroughly to remove the  shampoo.                           4.  Use CHG as you would any other liquid soap.  You can apply chg directly  to the skin and wash                       Gently with a scrungie or clean washcloth.  5.  Apply the CHG Soap to your body ONLY FROM THE NECK DOWN.   Do not use on face/ open                           Wound or open sores. Avoid contact with eyes, ears mouth and genitals (private parts).                       Wash face,  Genitals (private parts) with your normal soap.             6.  Wash thoroughly, paying special attention to the area where your surgery  will be performed.  7.  Thoroughly rinse your body with warm water from the neck down.  8.  DO NOT shower/wash with your normal soap after using and rinsing off  the CHG Soap.                9.  Pat yourself dry with a clean towel.            10.  Wear clean pajamas.            11.  Place clean sheets on your bed the night of your first shower and do not  sleep with pets. Day of Surgery : Do not apply any lotions/deodorants the morning of surgery.  Please wear clean clothes to the hospital/surgery center.  FAILURE TO FOLLOW THESE INSTRUCTIONS MAY RESULT IN THE CANCELLATION OF YOUR SURGERY PATIENT SIGNATURE_________________________________  NURSE SIGNATURE__________________________________  ________________________________________________________________________   [name]  An incentive spirometer is a  tool that can help keep your lungs clear and active. This tool measures how well you are filling your lungs with each breath. Taking long deep breaths may help reverse or decrease the chance of developing breathing (pulmonary) problems (especially infection) following:  A long period of time when you are unable to move or be active. BEFORE THE PROCEDURE   If the spirometer includes an indicator to show your best effort, your nurse or respiratory therapist will set it to a desired goal.  If possible, sit up straight or lean slightly forward. Try not to slouch.  Hold the incentive spirometer in an upright position. INSTRUCTIONS FOR USE  1. Sit on the edge of your bed if possible, or sit up as far as you can in bed or on a chair. 2. Hold the incentive spirometer in an upright position. 3. Breathe out normally. 4. Place the mouthpiece in your mouth and seal your lips tightly around it. 5. Breathe in slowly and as deeply as possible, raising the piston or the ball toward the top of the column. 6. Hold your breath for 3-5 seconds or for as long as possible. Allow the piston or ball to fall to the bottom of the column. 7. Remove the mouthpiece from your mouth and breathe out normally. 8. Rest for a few seconds and repeat Steps 1 through 7 at least 10 times every 1-2 hours when you are awake. Take your time and take a few normal breaths between deep breaths. 9. The spirometer may include an indicator to show your best effort. Use the indicator as a goal to work toward  during each repetition. 10. After each set of 10 deep breaths, practice coughing to be sure your lungs are clear. If you have an incision (the cut made at the time of surgery), support your incision when coughing by placing a pillow or rolled up towels firmly against it. Once you are able to get out of bed, walk around indoors and cough well. You may stop using the incentive spirometer when instructed by your caregiver.  RISKS AND  COMPLICATIONS  Take your time so you do not get dizzy or light-headed.  If you are in pain, you may need to take or ask for pain medication before doing incentive spirometry. It is harder to take a deep breath if you are having pain. AFTER USE  Rest and breathe slowly and easily.  It can be helpful to keep track of a log of your progress. Your caregiver can provide you with a simple table to help with this. If you are using the spirometer at home, follow these instructions: [place] IF:   You are having difficultly using the spirometer.  You have trouble using the spirometer as often as instructed.  Your pain medication is not giving enough relief while using the spirometer.  You develop fever of 100.5 F (38.1 C) or higher. SEEK IMMEDIATE MEDICAL CARE IF:   You cough up bloody sputum that had not been present before.  You develop fever of 102 F (38.9 C) or greater.  You develop worsening pain at or near the incision site. MAKE SURE YOU:   Understand these instructions.  Will watch your condition.  Will get help right away if you are not doing well or get worse. Document Released: 07/08/2006 Document Revised: 05/20/2011 Document Reviewed: 09/08/2006 ExitCare Patient Information 2014 ExitCare, [place].   ________________________________________________________________________    CLEAR LIQUID DIET   Foods Allowed                                                                     Foods Excluded  Coffee and tea, regular and decaf                             liquids that you cannot  Plain Jell-O in any flavor                                             see through such as: Fruit ices (not with fruit pulp)                                     milk, soups, orange juice  Iced Popsicles                                    All solid food Carbonated beverages, regular and diet  Cranberry, grape and apple juices Sports drinks like  Gatorade Lightly seasoned clear broth or consume(fat free) Sugar, honey syrup  Sample Menu Breakfast                                Lunch                                     Supper Cranberry juice                    Beef broth                            Chicken broth Jell-O                                     Grape juice                           Apple juice Coffee or tea                        Jell-O                                      Popsicle                                                Coffee or tea                        Coffee or tea  _____________________________________________________________________

## 2014-08-16 NOTE — Telephone Encounter (Signed)
This patient was scheduled for a shared decision making visit and low dose CT scan on July 7th at 2pm, and 3pm respectively. We scheduled this in early May. I have subsequently seen that the patient had a colonoscopy on 08/01/14 and was diagnosed with squamous cell cancer of the anus. She is scheduled for surgery 08/17/14, and has chemo/radiation treatments through 10/10/14. As the criteria for the Lung Cancer Screening program specify that patients with active cancer do not qualify, I am cancelling the appointment for July 7th. I am doing this for 2 reasons: first that the patient is having surgery and cancer treatments and that must be her first priority at this point. Glenda Stevens will have routine surveillance scans every 12 months of the chest/abdomen and pelvis per the American Society of Colon and Rectal surgeons practice guidelines due to risk of recurreance. She had a CT of the chest on 08/04/14 as part of a pre-op staging work up, which did not show any nodules.I will contact her primary care physician and let him know to refer her back to [place] after she is cancer free for 5 years. At that point she will meet criteria to be followed by the program again. I left a message for [name] on her answering machine to let her know I was cancelling the scan and appointment.I asked her to return my call to let me know she got the message.

## 2014-08-17 ENCOUNTER — Ambulatory Visit (HOSPITAL_COMMUNITY)
Admission: RE | Admit: 2014-08-17 | Discharge: 2014-08-17 | Disposition: A | Discharge status: Home / Self Care | Payer: Medicare Other | Source: Ambulatory Visit | Attending: Radiation Oncology | Admitting: Radiation Oncology

## 2014-08-17 ENCOUNTER — Encounter (HOSPITAL_COMMUNITY)
Admission: RE | Admit: 2014-08-17 | Discharge: 2014-08-17 | Disposition: A | Discharge status: Home / Self Care | Payer: Medicare Other | Source: Ambulatory Visit | Attending: General Surgery | Admitting: General Surgery

## 2014-08-17 ENCOUNTER — Encounter (HOSPITAL_COMMUNITY): Payer: Self-pay

## 2014-08-17 DIAGNOSIS — I10 Essential (primary) hypertension: Secondary | ICD-10-CM | POA: Diagnosis not present

## 2014-08-17 DIAGNOSIS — C210 Malignant neoplasm of anus, unspecified: Secondary | ICD-10-CM

## 2014-08-17 DIAGNOSIS — Z85038 Personal history of other malignant neoplasm of large intestine: Secondary | ICD-10-CM | POA: Diagnosis not present

## 2014-08-17 DIAGNOSIS — Z09 Encounter for follow-up examination after completed treatment for conditions other than malignant neoplasm: Secondary | ICD-10-CM | POA: Diagnosis present

## 2014-08-17 DIAGNOSIS — Z79899 Other long term (current) drug therapy: Secondary | ICD-10-CM | POA: Diagnosis not present

## 2014-08-17 DIAGNOSIS — D122 Benign neoplasm of ascending colon: Secondary | ICD-10-CM | POA: Diagnosis not present

## 2014-08-17 DIAGNOSIS — D123 Benign neoplasm of transverse colon: Secondary | ICD-10-CM | POA: Diagnosis not present

## 2014-08-17 DIAGNOSIS — F17200 Nicotine dependence, unspecified, uncomplicated: Secondary | ICD-10-CM | POA: Diagnosis not present

## 2014-08-17 DIAGNOSIS — Z86010 Personal history of colonic polyps: Secondary | ICD-10-CM | POA: Diagnosis not present

## 2014-08-17 DIAGNOSIS — F419 Anxiety disorder, unspecified: Secondary | ICD-10-CM | POA: Diagnosis not present

## 2014-08-17 HISTORY — DX: Nausea with vomiting, unspecified: R11.2

## 2014-08-17 HISTORY — DX: Other specified postprocedural states: Z98.890

## 2014-08-17 LAB — CBC
HEMATOCRIT: 45.4 % (ref 36.0–46.0)
HEMOGLOBIN: 15.3 g/dL — AB (ref 12.0–15.0)
MCH: 30.4 pg (ref 26.0–34.0)
MCHC: 33.7 g/dL (ref 30.0–36.0)
MCV: 90.1 fL (ref 78.0–100.0)
Platelets: 215 10*3/uL (ref 150–400)
RBC: 5.04 MIL/uL (ref 3.87–5.11)
RDW: 13.1 % (ref 11.5–15.5)
WBC: 6.6 10*3/uL (ref 4.0–10.5)

## 2014-08-17 LAB — BASIC METABOLIC PANEL
Anion gap: 7 (ref 5–15)
BUN: 10 mg/dL (ref 6–20)
CO2: 28 mmol/L (ref 22–32)
CREATININE: 0.71 mg/dL (ref 0.44–1.00)
Calcium: 9.3 mg/dL (ref 8.9–10.3)
Chloride: 101 mmol/L (ref 101–111)
GFR calc Af Amer: >60 mL/min (ref >60)
GFR calc non Af Amer: >60 mL/min (ref >60)
GLUCOSE: 120 mg/dL — AB (ref 65–99)
POTASSIUM: 4.3 mmol/L (ref 3.5–5.1)
SODIUM: 136 mmol/L (ref 135–145)

## 2014-08-18 ENCOUNTER — Ambulatory Visit (HOSPITAL_COMMUNITY): Payer: Medicare Other | Admitting: Certified Registered"

## 2014-08-18 ENCOUNTER — Encounter (HOSPITAL_COMMUNITY): Payer: Self-pay | Admitting: *Deleted

## 2014-08-18 ENCOUNTER — Ambulatory Visit (HOSPITAL_COMMUNITY)
Admission: RE | Admit: 2014-08-18 | Discharge: 2014-08-18 | Disposition: A | Discharge status: Home / Self Care | Payer: Medicare Other | Source: Ambulatory Visit | Attending: General Surgery | Admitting: General Surgery

## 2014-08-18 ENCOUNTER — Encounter (HOSPITAL_COMMUNITY)
Admission: RE | Disposition: A | Discharge status: Home / Self Care | Payer: Medicare Other | Source: Ambulatory Visit | Attending: General Surgery

## 2014-08-18 DIAGNOSIS — D122 Benign neoplasm of ascending colon: Secondary | ICD-10-CM | POA: Insufficient documentation

## 2014-08-18 DIAGNOSIS — F419 Anxiety disorder, unspecified: Secondary | ICD-10-CM | POA: Insufficient documentation

## 2014-08-18 DIAGNOSIS — C44520 Squamous cell carcinoma of anal skin: Secondary | ICD-10-CM

## 2014-08-18 DIAGNOSIS — D123 Benign neoplasm of transverse colon: Secondary | ICD-10-CM | POA: Insufficient documentation

## 2014-08-18 DIAGNOSIS — C210 Malignant neoplasm of anus, unspecified: Secondary | ICD-10-CM | POA: Insufficient documentation

## 2014-08-18 DIAGNOSIS — Z85038 Personal history of other malignant neoplasm of large intestine: Secondary | ICD-10-CM | POA: Insufficient documentation

## 2014-08-18 DIAGNOSIS — I10 Essential (primary) hypertension: Secondary | ICD-10-CM | POA: Diagnosis not present

## 2014-08-18 DIAGNOSIS — F17200 Nicotine dependence, unspecified, uncomplicated: Secondary | ICD-10-CM | POA: Insufficient documentation

## 2014-08-18 DIAGNOSIS — Z79899 Other long term (current) drug therapy: Secondary | ICD-10-CM | POA: Insufficient documentation

## 2014-08-18 DIAGNOSIS — Z86010 Personal history of colonic polyps: Secondary | ICD-10-CM | POA: Insufficient documentation

## 2014-08-18 HISTORY — PX: COLON RESECTION: SHX5231

## 2014-08-18 LAB — HEMOGLOBIN A1C
Hgb A1c MFr Bld: 5.4 % (ref 4.8–5.6)
Mean Plasma Glucose: 108 mg/dL

## 2014-08-18 SURGERY — COLECTOMY, HAND-ASSISTED, LAPAROSCOPIC
Anesthesia: General | Site: Abdomen

## 2014-08-18 MED ORDER — 0.9 % SODIUM CHLORIDE (POUR BTL) OPTIME
TOPICAL | Status: DC | PRN
  Administered 2014-08-18: 1000 mL

## 2014-08-18 MED ORDER — FENTANYL CITRATE (PF) 250 MCG/5ML IJ SOLN
INTRAMUSCULAR | Status: AC
  Filled 2014-08-18: qty 5

## 2014-08-18 MED ORDER — MIDAZOLAM HCL 5 MG/5ML IJ SOLN
INTRAMUSCULAR | Status: DC | PRN
  Administered 2014-08-18: 2 mg via INTRAVENOUS

## 2014-08-18 MED ORDER — ONDANSETRON HCL 4 MG/2ML IJ SOLN
INTRAMUSCULAR | Status: DC | PRN
  Administered 2014-08-18: 4 mg via INTRAVENOUS

## 2014-08-18 MED ORDER — FENTANYL CITRATE (PF) 100 MCG/2ML IJ SOLN
INTRAMUSCULAR | Status: DC | PRN
  Administered 2014-08-18: 25 ug via INTRAVENOUS
  Administered 2014-08-18 (×4): 50 ug via INTRAVENOUS
  Administered 2014-08-18: 25 ug via INTRAVENOUS

## 2014-08-18 MED ORDER — FENTANYL CITRATE (PF) 100 MCG/2ML IJ SOLN: 25.0000 ug | INTRAMUSCULAR | Status: DC | PRN

## 2014-08-18 MED ORDER — SODIUM CHLORIDE 0.9 % IJ SOLN: 3.0000 mL | INTRAMUSCULAR | Status: DC | PRN

## 2014-08-18 MED ORDER — LACTATED RINGERS IV SOLN
INTRAVENOUS | Status: DC
  Administered 2014-08-18: 1000 mL via INTRAVENOUS

## 2014-08-18 MED ORDER — SODIUM CHLORIDE 0.9 % IJ SOLN: 3.0000 mL | Freq: Two times a day (BID) | INTRAMUSCULAR | Status: DC

## 2014-08-18 MED ORDER — MIDAZOLAM HCL 2 MG/2ML IJ SOLN
INTRAMUSCULAR | Status: AC
  Filled 2014-08-18: qty 2

## 2014-08-18 MED ORDER — CEFOTETAN DISODIUM-DEXTROSE 2-2.08 GM-% IV SOLR
INTRAVENOUS | Status: AC
  Filled 2014-08-18: qty 50

## 2014-08-18 MED ORDER — ROCURONIUM BROMIDE 100 MG/10ML IV SOLN
INTRAVENOUS | Status: DC | PRN
  Administered 2014-08-18: 10 mg via INTRAVENOUS
  Administered 2014-08-18: 20 mg via INTRAVENOUS
  Administered 2014-08-18: 30 mg via INTRAVENOUS
  Administered 2014-08-18: 10 mg via INTRAVENOUS

## 2014-08-18 MED ORDER — OXYCODONE HCL 5 MG PO TABS: 5.0000 mg | ORAL_TABLET | ORAL | Status: DC | PRN

## 2014-08-18 MED ORDER — LABETALOL HCL 5 MG/ML IV SOLN
INTRAVENOUS | Status: DC | PRN
  Administered 2014-08-18 (×2): 10 mg via INTRAVENOUS

## 2014-08-18 MED ORDER — PROPOFOL 10 MG/ML IV BOLUS
INTRAVENOUS | Status: AC
  Filled 2014-08-18: qty 20

## 2014-08-18 MED ORDER — LABETALOL HCL 5 MG/ML IV SOLN
INTRAVENOUS | Status: AC
  Filled 2014-08-18: qty 4

## 2014-08-18 MED ORDER — SODIUM CHLORIDE 0.9 % IV SOLN: 250.0000 mL | INTRAVENOUS | Status: DC | PRN

## 2014-08-18 MED ORDER — LACTATED RINGERS IV SOLN
INTRAVENOUS | Status: DC | PRN
  Administered 2014-08-18 (×2): via INTRAVENOUS

## 2014-08-18 MED ORDER — EPHEDRINE SULFATE 50 MG/ML IJ SOLN
INTRAMUSCULAR | Status: DC | PRN
Start: 2014-08-18 — End: 2014-08-18
  Administered 2014-08-18: 5 mg via INTRAVENOUS

## 2014-08-18 MED ORDER — GLYCOPYRROLATE 0.2 MG/ML IJ SOLN
INTRAMUSCULAR | Status: DC | PRN
  Administered 2014-08-18: 0.6 mg via INTRAVENOUS

## 2014-08-18 MED ORDER — ACETAMINOPHEN 325 MG PO TABS: 650.0000 mg | ORAL_TABLET | ORAL | Status: DC | PRN

## 2014-08-18 MED ORDER — LIDOCAINE HCL (CARDIAC) 20 MG/ML IV SOLN
INTRAVENOUS | Status: DC | PRN
  Administered 2014-08-18: 50 mg via INTRAVENOUS

## 2014-08-18 MED ORDER — PROPOFOL 10 MG/ML IV BOLUS
INTRAVENOUS | Status: DC | PRN
  Administered 2014-08-18: 150 mg via INTRAVENOUS

## 2014-08-18 MED ORDER — ACETAMINOPHEN 650 MG RE SUPP
650.0000 mg | RECTAL | Status: DC | PRN
  Filled 2014-08-18: qty 1

## 2014-08-18 MED ORDER — BUPIVACAINE-EPINEPHRINE (PF) 0.25% -1:200000 IJ SOLN
INTRAMUSCULAR | Status: AC
Start: 2014-08-18 — End: 2014-08-18
  Filled 2014-08-18: qty 30

## 2014-08-18 MED ORDER — DEXTROSE 5 % IV SOLN
2.0000 g | INTRAVENOUS | Status: AC
  Administered 2014-08-18: 2 g via INTRAVENOUS
  Filled 2014-08-18: qty 2

## 2014-08-18 MED ORDER — PROMETHAZINE HCL 25 MG/ML IJ SOLN: 6.2500 mg | INTRAMUSCULAR | Status: DC | PRN

## 2014-08-18 MED ORDER — LIDOCAINE HCL (CARDIAC) 20 MG/ML IV SOLN
INTRAVENOUS | Status: AC
  Filled 2014-08-18: qty 5

## 2014-08-18 MED ORDER — NEOSTIGMINE METHYLSULFATE 10 MG/10ML IV SOLN
INTRAVENOUS | Status: DC | PRN
  Administered 2014-08-18: 4 mg via INTRAVENOUS

## 2014-08-18 MED ORDER — MEPERIDINE HCL 50 MG/ML IJ SOLN: 6.2500 mg | INTRAMUSCULAR | Status: DC | PRN

## 2014-08-18 MED ORDER — DEXAMETHASONE SODIUM PHOSPHATE 10 MG/ML IJ SOLN
INTRAMUSCULAR | Status: DC | PRN
  Administered 2014-08-18: 10 mg via INTRAVENOUS

## 2014-08-18 SURGICAL SUPPLY — 63 items
APPLIER CLIP 5 13 M/L LIGAMAX5 (MISCELLANEOUS)
APPLIER CLIP ROT 10 11.4 M/L (STAPLE)
BAG URINE DRAINAGE (UROLOGICAL SUPPLIES) IMPLANT
BAG URO CATCHER STRL LF (DRAPE) IMPLANT
BLADE EXTENDED COATED 6.5IN (ELECTRODE) IMPLANT
BLADE HEX COATED 2.75 (ELECTRODE) IMPLANT
CABLE HIGH FREQUENCY MONO STRZ (ELECTRODE) IMPLANT
CATH FOLEY SILVER 30CC 28FR (CATHETERS) IMPLANT
CELLS DAT CNTRL 66122 CELL SVR (MISCELLANEOUS) IMPLANT
CLIP APPLIE 5 13 M/L LIGAMAX5 (MISCELLANEOUS) IMPLANT
CLIP APPLIE ROT 10 11.4 M/L (STAPLE) IMPLANT
DECANTER SPIKE VIAL GLASS SM (MISCELLANEOUS) IMPLANT
DRAIN CHANNEL 19F RND (DRAIN) IMPLANT
DRAPE LAPAROSCOPIC ABDOMINAL (DRAPES) ×3 IMPLANT
DRAPE SHEET LG 3/4 BI-LAMINATE (DRAPES) ×3 IMPLANT
ELECT REM PT RETURN 9FT ADLT (ELECTROSURGICAL) ×3
ELECTRODE REM PT RTRN 9FT ADLT (ELECTROSURGICAL) ×1 IMPLANT
FILTER SMOKE EVAC LAPAROSHD (FILTER) IMPLANT
GAUZE SPONGE 4X4 12PLY STRL (GAUZE/BANDAGES/DRESSINGS) IMPLANT
GLOVE BIO SURGEON STRL SZ 6.5 (GLOVE) ×4 IMPLANT
GLOVE BIO SURGEONS STRL SZ 6.5 (GLOVE) ×2
GLOVE BIOGEL PI IND STRL 7.0 (GLOVE) ×1 IMPLANT
GLOVE BIOGEL PI INDICATOR 7.0 (GLOVE) ×2
GLOVE INDICATOR 8.0 STRL GRN (GLOVE) IMPLANT
GOWN STRL REUS W/TWL 2XL LVL3 (GOWN DISPOSABLE) ×3 IMPLANT
GOWN STRL REUS W/TWL LRG LVL3 (GOWN DISPOSABLE) ×3 IMPLANT
LEGGING LITHOTOMY PAIR STRL (DRAPES) IMPLANT
LIGASURE IMPACT 36 18CM CVD LR (INSTRUMENTS) IMPLANT
LIQUID BAND (GAUZE/BANDAGES/DRESSINGS) ×3 IMPLANT
NS IRRIG 1000ML POUR BTL (IV SOLUTION) ×3 IMPLANT
PACK COLON (CUSTOM PROCEDURE TRAY) IMPLANT
PACK LAPAROSCOPY W LONG (CUSTOM PROCEDURE TRAY) ×3 IMPLANT
RTRCTR WOUND ALEXIS 18CM MED (MISCELLANEOUS)
SCISSORS LAP 5X35 DISP (ENDOMECHANICALS) ×3 IMPLANT
SET IRRIG TUBING LAPAROSCOPIC (IRRIGATION / IRRIGATOR) ×3 IMPLANT
SHEARS HARMONIC ACE PLUS 36CM (ENDOMECHANICALS) IMPLANT
SLEEVE SURGEON STRL (DRAPES) ×3 IMPLANT
SLEEVE Z-THREAD 5X100MM (TROCAR) ×3 IMPLANT
SOLUTION ANTI FOG 6CC (MISCELLANEOUS) ×3 IMPLANT
STAPLER VISISTAT 35W (STAPLE) IMPLANT
SUT PDS AB 1 CTX 36 (SUTURE) IMPLANT
SUT PDS AB 1 TP1 96 (SUTURE) IMPLANT
SUT PROLENE 2 0 KS (SUTURE) IMPLANT
SUT SILK 2 0 (SUTURE)
SUT SILK 2 0 SH CR/8 (SUTURE) IMPLANT
SUT SILK 2-0 18XBRD TIE 12 (SUTURE) IMPLANT
SUT SILK 3 0 (SUTURE)
SUT SILK 3 0 SH CR/8 (SUTURE) IMPLANT
SUT SILK 3-0 18XBRD TIE 12 (SUTURE) IMPLANT
SUT VIC AB 2-0 SH 18 (SUTURE) ×6 IMPLANT
SUT VICRYL 2 0 18  UND BR (SUTURE)
SUT VICRYL 2 0 18 UND BR (SUTURE) IMPLANT
SUT VICRYL 4-0 (SUTURE) ×3 IMPLANT
SYR 30ML LL (SYRINGE) IMPLANT
SYRINGE IRR TOOMEY STRL 70CC (SYRINGE) IMPLANT
SYS LAPSCP GELPORT 120MM (MISCELLANEOUS)
SYSTEM LAPSCP GELPORT 120MM (MISCELLANEOUS) IMPLANT
TRAY FOLEY W/METER SILVER 14FR (SET/KITS/TRAYS/PACK) IMPLANT
TROCAR BLADELESS OPT 5 100 (ENDOMECHANICALS) ×6 IMPLANT
TROCAR XCEL NON-BLD 11X100MML (ENDOMECHANICALS) ×3 IMPLANT
TROCAR XCEL UNIV SLVE 11M 100M (ENDOMECHANICALS) IMPLANT
TUBING FILTER THERMOFLATOR (ELECTROSURGICAL) ×3 IMPLANT
YANKAUER SUCT BULB TIP NO VENT (SUCTIONS) IMPLANT

## 2014-08-18 NOTE — Anesthesia Postprocedure Evaluation (Signed)
  Anesthesia Post-op Note  Patient: Glenda Stevens  Procedure(s) Performed: Procedure(s) (LRB): LAPAROSCOPIC ASSISTED  COLON POLYP RESECTION , DIAGNOSTIC LAPROSCOPY  (N/A)  Patient Location: PACU  Anesthesia Type: General  Level of Consciousness: awake and alert   Airway and Oxygen Therapy: Patient Spontanous Breathing  Post-op Pain: mild  Post-op Assessment: Post-op Vital signs reviewed, Patient's Cardiovascular Status Stable, Respiratory Function Stable, Patent Airway and No signs of Nausea or vomiting  Last Vitals:  Filed Vitals:   08/18/14 1708  BP: 114/42  Pulse: 63  Temp: 36.4 C  Resp: 18    Post-op Vital Signs: stable   Complications: No apparent anesthesia complications

## 2014-08-18 NOTE — Progress Notes (Signed)
Dr Marcello Moores reported no prescriptions needed for pt, tylenol etc for pain as needed.

## 2014-08-18 NOTE — Transfer of Care (Signed)
Immediate Anesthesia Transfer of Care Note  Patient: Glenda Stevens  Procedure(s) Performed: Procedure(s): LAPAROSCOPIC ASSISTED  COLON POLYP RESECTION , DIAGNOSTIC LAPROSCOPY  (N/A)  Patient Location: PACU  Anesthesia Type:General  Level of Consciousness: awake, alert  and oriented  Airway & Oxygen Therapy: Patient Spontanous Breathing and Patient connected to face mask oxygen  Post-op Assessment: Report given to RN and Post -op Vital signs reviewed and stable  Post vital signs: Reviewed and stable  Last Vitals:  Filed Vitals:   08/18/14 1222  BP: 156/74  Pulse: 83  Temp: 36.7 C  Resp: 18    Complications: No apparent anesthesia complications

## 2014-08-18 NOTE — Interval H&P Note (Signed)
History and Physical Interval Note:  08/18/2014 1:29 PM  Glenda Stevens  has presented today for surgery, with the diagnosis of colon polyp  The various methods of treatment have been discussed with the patient and family. After consideration of risks, benefits and other options for treatment, the patient has consented to  Procedure(s): LAPAROSCOPIC ASSISTED  COLON POLYP RESECTION (N/A) as a surgical intervention .  The patient's history has been reviewed, patient examined, no change in status, stable for surgery.  I have reviewed the patient's chart and labs.  Questions were answered to the patient's satisfaction.     Rosario Adie, MD  [facility]

## 2014-08-18 NOTE — Op Note (Signed)
08/18/2014  3:28 PM  PATIENT:  Glenda Stevens  69 y.o. female  Patient Care Team: Tonia Ghent, MD as PCP - General (Family Medicine)  PRE-OPERATIVE DIAGNOSIS:  colon polyp  POST-OPERATIVE DIAGNOSIS:  colon polyp  PROCEDURE:  LAPAROSCOPIC ASSISTED ENDOSCOPIC COLON POLYP RESECTION , DIAGNOSTIC LAPROSCOPY     Surgeon(s): Leighton Ruff, MD Milus Banister, MD    ANESTHESIA:   general  EBL: min Total I/O In: 1000 [I.V.:1000] Out: -   DRAINS: none   SPECIMEN:  Source of Specimen:  colon polyp  DISPOSITION OF SPECIMEN:  PATHOLOGY  COUNTS:  YES  PLAN OF CARE: Discharge to home after PACU  PATIENT DISPOSITION:  PACU - hemodynamically stable.  INDICATION: 69 y.o. F with 2 large polyps that were unable to be resected with traditional endoscopic techniques.  I offered to assist Dr Ardis Hughs by doing colonoscopy under laparoscopic visualization to position the colon in a way to make endoscopic resection feasible and safe.     OR FINDINGS: Ascending colon polyps  DESCRIPTION: the patient was identified in the preoperative holding area and taken to the OR where they were laid supine on the operating room table.  General anesthesia was induced without difficulty. SCDs were also noted to be in place prior to the initiation of anesthesia.  The patient was placed in lithotomy position. The patient was then prepped and draped in the usual sterile fashion.   A surgical timeout was performed indicating the correct patient, procedure, positioning and need for preoperative antibiotics.   I began by placing the patient in reverse Trendelenburg position with right side down. I confirmed placement. I then introduced a varies needle in the left upper quadrant and insufflated without difficulty. I then placed a 5 mm port in the infraumbilical region after the abdomen was insufflated to 15 mmHg. I placed 2 additional 5 mm ports in the right lateral sidewall under direct visualization. The varies entry  site was clean. The infraumbilical entry site was clean and there was no signs of injury at either site. I then evaluated the entire abdomen. The liver and stomach appeared normal. The spleen appeared to be an appropriate size. The small and large bowel appeared to be normal in caliber and color. The appendix was normal. The ovaries appeared normal for the patient's age as well. There was omental adhesions to the sigmoid colon. These are taken down using scissors and electrocautery. After this was completed, I assisted Dr. Ardis Hughs and introducing the colonoscope into the colon through the transverse colon and to the level of the previously tattooed area in the ascending colon. There were 2 polyps in this area that were unresectable. Dr. Ardis Hughs proceeded to resect these with my assistance in positioning the colon in the appropriate manner. After he had completed his endoscopic resections, I evaluated the colonic wall. There was no sign of perforation or thermal injury. I placed the omentum over this and evaluated the abdomen one more time. There were no other abnormalities noted. The abdomen was then desufflated. The 5 mm port incisions were closed using 4-0 Vicryl suture and Dermabond. Patient tolerated the procedure well and was sent to the postanesthesia care unit in stable condition. All counts were correct per operating room staff.

## 2014-08-18 NOTE — Anesthesia Procedure Notes (Signed)
Procedure Name: Intubation Date/Time: 08/18/2014 1:51 PM Performed by: Pilar Grammes Pre-anesthesia Checklist: Patient identified, Emergency Drugs available, Suction available, Patient being monitored and Timeout performed Patient Re-evaluated:Patient Re-evaluated prior to inductionOxygen Delivery Method: Circle system utilized Preoxygenation: Pre-oxygenation with 100% oxygen Intubation Type: IV induction Ventilation: Mask ventilation without difficulty Laryngoscope Size: Miller and 2 Grade View: Grade III Tube type: Oral Tube size: 7.5 mm Number of attempts: 1 Airway Equipment and Method: Stylet Placement Confirmation: positive ETCO2,  ETT inserted through vocal cords under direct vision,  CO2 detector and breath sounds checked- equal and bilateral Secured at: 20 cm Tube secured with: Tape Dental Injury: Teeth and Oropharynx as per pre-operative assessment

## 2014-08-18 NOTE — H&P (View-Only) (Signed)
History of Present Illness Leighton Ruff MD; 03/19/2991 5:07 PM) Patient words: discuss surgery.  The patient is a 69 year old female who presents with a colonic polyp. 69 year old female who underwent colonoscopy by Dr. Ardis Hughs earlier this month. She was noted to have an anal mass which is tested positive for anus squamous cell carcinoma. Metastatic workup shows no other signs of distant disease. Of note on colonoscopy she was noted to have a transverse colon polyp that was unresectable at that time. I was asked by Dr. Ardis Hughs to consider laparoscopic assistance of the transverse colon polyp so that she could proceed to chemotherapy and radiation for anal cancer. Other Problems Mammie Lorenzo, LPN; 09/08/6965 8:93 PM) Anxiety Disorder Colon Cancer High blood pressure Hypercholesterolemia  Past Surgical History Mammie Lorenzo, LPN; 10/09/173 1:02 PM) Breast Biopsy Bilateral. Cataract Surgery Bilateral. Hysterectomy (not due to cancer) - Complete  Diagnostic Studies History Mammie Lorenzo, LPN; 07/17/5275 8:24 PM) Colonoscopy within last year Mammogram never Pap Smear >5 years ago  Allergies Mammie Lorenzo, LPN; 04/14/5359 4:43 PM) Crestor *ANTIHYPERLIPIDEMICS*  Medication History Mammie Lorenzo, LPN; 03/15/4006 6:76 PM) ALPRAZolam (0.25MG  Tablet, Oral) Active. Atenolol (50MG  Tablet, Oral) Active. Candesartan Cilexetil (32MG  Tablet, Oral) Active. Sertraline HCl (100MG  Tablet, Oral) Active. ZyrTEC Childrens Allergy (10MG  Tablet Chewable, Oral) Active. Zantac 150 Maximum Strength (150MG  Tablet, Oral) Active. Multivitamins (Oral) Active. Medications Reconciled  Social History Mammie Lorenzo, LPN; 03/20/5091 2:67 PM) Alcohol use Moderate alcohol use. Caffeine use Carbonated beverages, Tea. No drug use Tobacco use Current every day smoker.  Family History Mammie Lorenzo, LPN; 03/12/4578 9:98 PM) Heart Disease Father, Mother. Hypertension Mother, Son. Prostate  Cancer Father. Respiratory Condition Father.  Pregnancy / Birth History Mammie Lorenzo, LPN; 05/11/8248 5:39 PM) Age at menarche 63 years. Age of menopause <43 Gravida 1 Irregular periods Maternal age 26-25 Para 1     Review of Systems Mammie Lorenzo LPN; 09/14/7339 9:37 PM) General Not Present- Appetite Loss, Chills, Fatigue, Fever, Night Sweats, Weight Gain and Weight Loss. Skin Not Present- Change in Wart/Mole, Dryness, Hives, Jaundice, New Lesions, Non-Healing Wounds, Rash and Ulcer. HEENT Present- Seasonal Allergies and Wears glasses/contact lenses. Not Present- Earache, Hearing Loss, Hoarseness, Nose Bleed, Oral Ulcers, Ringing in the Ears, Sinus Pain, Sore Throat, Visual Disturbances and Yellow Eyes. Respiratory Present- Snoring. Not Present- Bloody sputum, Chronic Cough, Difficulty Breathing and Wheezing. Breast Not Present- Breast Mass, Breast Pain, Nipple Discharge and Skin Changes. Cardiovascular Not Present- Chest Pain, Difficulty Breathing Lying Down, Leg Cramps, Palpitations, Rapid Heart Rate, Shortness of Breath and Swelling of Extremities. Gastrointestinal Present- Abdominal Pain, Chronic diarrhea and Indigestion. Not Present- Bloating, Bloody Stool, Change in Bowel Habits, Constipation, Difficulty Swallowing, Excessive gas, Gets full quickly at meals, Hemorrhoids, Nausea, Rectal Pain and Vomiting. Female Genitourinary Not Present- Frequency, Nocturia, Painful Urination, Pelvic Pain and Urgency. Musculoskeletal Not Present- Back Pain, Joint Pain, Joint Stiffness, Muscle Pain, Muscle Weakness and Swelling of Extremities. Neurological Not Present- Decreased Memory, Fainting, Headaches, Numbness, Seizures, Tingling, Tremor, Trouble walking and Weakness. Psychiatric Not Present- Anxiety, Bipolar, Change in Sleep Pattern, Depression, Fearful and Frequent crying. Endocrine Not Present- Cold Intolerance, Excessive Hunger, Hair Changes, Heat Intolerance, Hot flashes and New  Diabetes. Hematology Not Present- Easy Bruising, Excessive bleeding, Gland problems, HIV and Persistent Infections.  Vitals Claiborne Billings Dockery LPN; 9/0/2409 7:35 PM) 08/15/2014 4:55 PM Weight: 152.4 lb Height: 62.5in Body Surface Area: 1.75 m Body Mass Index: 27.43 kg/m Temp.: 98.20F(Oral)  Pulse: 88 (Regular)  BP: 128/86 (Sitting, Left Arm, Standard)  Physical Exam Leighton Ruff MD; 07/12/6142 5:08 PM)  General Mental Status-Alert. General Appearance-Consistent with stated age. Hydration-Well hydrated. Voice-Normal.  Head and Neck Head-normocephalic, atraumatic with no lesions or palpable masses. Trachea-midline. Thyroid Gland Characteristics - normal size and consistency.  Eye Eyeball - Bilateral-Extraocular movements intact. Sclera/Conjunctiva - Bilateral-No scleral icterus.  Chest and Lung Exam Chest and lung exam reveals -quiet, even and easy respiratory effort with no use of accessory muscles and on auscultation, normal breath sounds, no adventitious sounds and normal vocal resonance. Inspection Chest Wall - Normal. Back - normal.  Cardiovascular Cardiovascular examination reveals -normal heart sounds, regular rate and rhythm with no murmurs and normal pedal pulses bilaterally.  Abdomen Inspection Inspection of the abdomen reveals - No Hernias. Skin - Scar - Note: Well-healed Pfannenstiel incision. Palpation/Percussion Palpation and Percussion of the abdomen reveal - Soft, Non Tender, No Rebound tenderness, No Rigidity (guarding) and No hepatosplenomegaly. Auscultation Auscultation of the abdomen reveals - Bowel sounds normal.  Neurologic Neurologic evaluation reveals -alert and oriented x 3 with no impairment of recent or remote memory. Mental Status-Normal.  Musculoskeletal Normal Exam - Left-Upper Extremity Strength Normal and Lower Extremity Strength Normal. Normal Exam - Right-Upper Extremity Strength Normal and  Lower Extremity Strength Normal.    Assessment & Plan Leighton Ruff MD; 05/09/5398 5:09 PM)  POLYP OF TRANSVERSE COLON (211.3  D12.3) Impression: 69 year old female with anal squamous cell carcinoma and a polyp in the transverse colon. We will attempt to perform a laparoscopic-assisted endoscopically resected colon polyp. This will be scheduled with Dr. Ardis Hughs. We discussed the risk of the procedure which are bleeding, infection, damage to adjacent structures and need for additional surgery. I think all of these risks are very minimal. If there was damage to the colon after endoscopic resection, we will plan to perform primary repair during the surgery.

## 2014-08-18 NOTE — Anesthesia Preprocedure Evaluation (Addendum)
Anesthesia Evaluation  Patient identified by MRN, date of birth, ID band Patient awake    Reviewed: Allergy & Precautions, NPO status , Patient's Chart, lab work & pertinent test results  History of Anesthesia Complications (+) PONV  Airway Mallampati: II  TM Distance: >3 FB Neck ROM: Full    Dental no notable dental hx.    Pulmonary Current Smoker,  breath sounds clear to auscultation  Pulmonary exam normal       Cardiovascular hypertension, Pt. on medications Normal cardiovascular examRhythm:Regular Rate:Normal     Neuro/Psych negative neurological ROS  negative psych ROS   GI/Hepatic negative GI ROS, Neg liver ROS,   Endo/Other  negative endocrine ROS  Renal/GU negative Renal ROS  negative genitourinary   Musculoskeletal negative musculoskeletal ROS (+)   Abdominal   Peds negative pediatric ROS (+)  Hematology negative hematology ROS (+)   Anesthesia Other Findings   Reproductive/Obstetrics negative OB ROS                           Anesthesia Physical Anesthesia Plan  ASA: II  Anesthesia Plan: General   Post-op Pain Management:    Induction: Intravenous  Airway Management Planned: Oral ETT  Additional Equipment:   Intra-op Plan:   Post-operative Plan: Extubation in OR  Informed Consent: I have reviewed the patients History and Physical, chart, labs and discussed the procedure including the risks, benefits and alternatives for the proposed anesthesia with the patient or authorized representative who has indicated his/her understanding and acceptance.   Dental advisory given  Plan Discussed with: CRNA  Anesthesia Plan Comments:         Anesthesia Quick Evaluation

## 2014-08-18 NOTE — Discharge Instructions (Signed)
LAPAROSCOPIC SURGERY: POST OP INSTRUCTIONS ° °1. DIET: Follow a light bland diet the first 24 hours after arrival home, such as soup, liquids, crackers, etc.  Be sure to include lots of fluids daily.  Avoid fast food or heavy meals as your are more likely to get nauseated.  Eat a low fat the next few days after surgery.   °2. Take your usually prescribed home medications unless otherwise directed. °3. PAIN CONTROL: °a. Pain is best controlled by a usual combination of three different methods TOGETHER: °i. Ice/Heat °ii. Over the counter pain medication °iii. Prescription pain medication °b. Most patients will experience some swelling and bruising around the incisions.  Ice packs or heating pads (30-60 minutes up to 6 times a day) will help. Use ice for the first few days to help decrease swelling and bruising, then switch to heat to help relax tight/sore spots and speed recovery.  Some people prefer to use ice alone, heat alone, alternating between ice & heat.  Experiment to what works for you.  Swelling and bruising can take several weeks to resolve.   °c. It is helpful to take an over-the-counter pain medication regularly for the first few weeks.  Choose one of the following that works best for you: °i. Naproxen (Aleve, etc)  Two 220mg tabs twice a day °ii. Ibuprofen (Advil, etc) Three 200mg tabs four times a day (every meal & bedtime) °d. A  prescription for pain medication (such as percocet, vicodin, oxycodone, hydrocodone, etc) should be given to you upon discharge.  Take your pain medication as prescribed.  °i. If you are having problems/concerns with the prescription medicine (does not control pain, nausea, vomiting, rash, itching, etc), please call us [phone] to see if we need to switch you to a different pain medicine that will work better for you and/or control your side effect better. °ii. If you need a refill on your pain medication, please contact your pharmacy.  They will contact our office to  request authorization. Prescriptions will not be filled after 5 pm or on week-ends. ° ° °4. Avoid getting constipated.  Between the surgery and the pain medications, it is common to experience some constipation.  Increasing fluid intake and taking a fiber supplement (such as Metamucil, Citrucel, FiberCon, MiraLax, etc) 1-2 times a day regularly will usually help prevent this problem from occurring.  A mild laxative (prune juice, Milk of Magnesia, MiraLax, etc) should be taken according to package directions if there are no bowel movements after 48 hours.   °5. Watch out for diarrhea.  If you have many loose bowel movements, simplify your diet to bland foods & liquids for a few days.  Stop any stool softeners and decrease your fiber supplement.  Switching to mild anti-diarrheal medications (Kayopectate, Pepto Bismol) can help.  If this worsens or does not improve, please call us. °6. Wash / shower every day.  You may shower over the dressings as they are waterproof.  Continue to shower over incision(s) after the dressing is off. °7. Remove your waterproof bandages 5 days after surgery.  You may leave the incision open to air.  You may replace a dressing/Band-Aid to cover the incision for comfort if you wish.  °8. ACTIVITIES as tolerated:   °a. You may resume regular (light) daily activities beginning the next day--such as daily self-care, walking, climbing stairs--gradually increasing activities as tolerated.  If you can walk 30 minutes without difficulty, it is safe to try more intense activity such as   jogging, treadmill, bicycling, low-impact aerobics, swimming, etc. b. Save the most intensive and strenuous activity for last such as sit-ups, heavy lifting, contact sports, etc  Refrain from any heavy lifting or straining until you are off narcotics for pain control.   c. DO NOT PUSH THROUGH PAIN.  Let pain be your guide: If it hurts to do something, don't do it.  Pain is your body warning you to avoid that  activity for another week until the pain goes down. d. You may drive when you are no longer taking prescription pain medication, you can comfortably wear a seatbelt, and you can safely maneuver your car and apply brakes. e. Dennis Bast may have sexual intercourse when it is comfortable.  9. FOLLOW UP in our office a. Please call [facility] at [phone] to set up an appointment to see your surgeon in the office for a follow-up appointment approximately 2-3 weeks after your surgery. b. Make sure that you call for this appointment the day you arrive home to insure a convenient appointment time. 10. IF YOU HAVE DISABILITY OR FAMILY LEAVE FORMS, BRING THEM TO THE OFFICE FOR PROCESSING.  DO NOT GIVE THEM TO YOUR DOCTOR.   WHEN TO CALL us [phone]: 1. Poor pain control 2. Reactions / problems with new medications (rash/itching, nausea, etc)  3. Fever over 101.5 F (38.5 C) 4. Inability to urinate 5. Nausea and/or vomiting 6. Worsening swelling or bruising 7. Continued bleeding from incision. 8. Increased pain, redness, or drainage from the incision   The clinic staff is available to answer your questions during regular business hours (8:30am-5pm).  Please dont hesitate to call and ask to speak to one of our nurses for clinical concerns.   If you have a medical emergency, go to the nearest emergency room or call 911.  A surgeon from [facility] is always on call at the hospitals  Post Colonoscopy Instructions  10. DIET: Follow a light bland diet the first 24 hours after arrival home, such as soup, liquids, crackers, etc.  Be sure to include lots of fluids daily.  Avoid fast food or heavy meals as your are more likely to get nauseated.   11. You may have some mild rectal bleeding for the first few days after the procedure.  This should get less and less with time.  Resume any blood thinners 2 days after your procedure unless directed otherwise by your physician. 12. Take your usually  prescribed home medications unless otherwise directed. a. If you have any pain, it is helpful to get up and walk around, as it is usually from excess gas. b. If this is not helpful, you can take an over-the-counter pain medication.  Choose one of the following that works best for you: i. Naproxen (Aleve, etc)  Two 220mg  tabs twice a day ii. Ibuprofen (Advil, etc) Three 200mg  tabs four times a day (every meal & bedtime) iii. If you still have pain after using one of these, please call the office 13. It is normal to not have a bowel movement for 2-3 days after colonoscopy.    14. ACTIVITIES as tolerated:   15. You may resume regular (light) daily activities beginning the next day--such as daily self-care, walking, climbing stairs--gradually increasing activities as tolerated.    WHEN TO CALL us [phone]: 9. Fever over 101.5 F (38.5 C)  10. Severe abdominal or chest pain  11. Large amount of rectal bleeding, passing multiple blood clots  12. Dizziness or shortness  of breath 13. Increasing nausea or vomiting   The clinic staff is available to answer your questions during regular business hours (8:30am-5pm).  Please dont hesitate to call and ask to speak to one of our nurses for clinical concerns.   If you have a medical emergency, go to the nearest emergency room or call 911.  A surgeon from [facility] is always on call at the [facility], [place], [place], [place], [place]  [id number] ? MAIN: [phone] ? TOLL FREE: [phone] ?  FAX [phone] [email or web address]

## 2014-08-18 NOTE — Op Note (Signed)
[facility] [place], [address]   COLONOSCOPY PROCEDURE REPORT  PATIENT: Morgin, Halls  MR#: [id number] BIRTHDATE: February 26, 1946 , 29  yrs. old GENDER: female ENDOSCOPIST: Milus Banister, MD PROCEDURE DATE:  08/18/2014 PROCEDURE:   Colonoscopy with snare polypectomy and Colonoscopy, surveillance First Screening Colonoscopy - Avg.  risk and is 50 yrs.  old or older - No.  Prior Negative Screening - Now for repeat screening. N/A  History of Adenoma - Now for follow-up colonoscopy & has been > or = to 3 yrs.  N/A  Polyps removed today? Yes ASA CLASS:   Class III INDICATIONS:multiple adenomatous polyps removed recently; also anus squamous cell cancer diagnosed; in tranverse colon 2 adajenct polyps were felt unable to be removed endoscopically, site was labeled with tatoo. MEDICATIONS: Per Anesthesia  DESCRIPTION OF PROCEDURE:   After the risks benefits and alternatives of the procedure were thoroughly explained, informed consent was obtained.  The digital rectal exam revealed a palpable rectal mass.   The Pentax Ped Colon [id number]  endoscope was introduced through the anus and advanced to the cecum, which was identified by both the appendix and ileocecal valve. No adverse events experienced.   The quality of the prep was excellent.  The instrument was then slowly withdrawn as the colon was fully examined. Estimated blood loss is zero unless otherwise noted in this procedure report.      COLON FINDINGS: In the distal ascending colon, near the hepatic flexure the two sessile polyps were again located.  One was 1cm, the other about 1.7cm along a fold.  Neither now appeared cancerous.  Previous tatoo at the site was somewhat visible.  With external pressure, manipulation of the colon by Dr.  Marcello Moores using laparscopic technique I was able to resect the polyps.  I used saline pressure injector to lift the polyps and then piecemeal snare/cautery technique.   I felt there was a small amount of remaining polyp tissue at the site and I used snare tip cautery to cauterize it.  Three other small (2-24mm) clearly adenomatous polyps were removed from the right colon (not retrieved).  The examination was otherwise normal.  Retroflexed views revealed no abnormalities. The time to cecum = NA       Withdrawal time = NA        The scope was withdrawn and the procedure completed. COMPLICATIONS: There were no immediate complications.  ENDOSCOPIC IMPRESSION: In the distal ascending colon, near the hepatic flexure the two sessile polyps were again located.  One was 1cm, the other about 1.7cm along a fold.  Neither now appeared cancerous.  Previous tatoo at the site was somewhat visible.  With external pressure and manipulation of the colon by Dr.  Marcello Moores using laparscopic technique I was able to resect the polyps.  I used saline pressure injector to lift the polyps and then piecemeal snare/cautery technique.  I felt there was a small amount of remaining polyp tissue at the site and I used snare tip cautery to cauterize it. Three other small (2-45mm) clearly adenomatous polyps were removed from the right colon (not retrieved).  The examination was otherwise normal  RECOMMENDATIONS: Await final pathology, likely you will need repeat colonoscopy in 1 year.  eSigned:  Milus Banister, MD 08/18/2014 3:50 PM     PATIENT NAME:  Shanaiya, Bene MR#: [id number]

## 2014-08-19 ENCOUNTER — Encounter (HOSPITAL_COMMUNITY): Payer: Self-pay | Admitting: General Surgery

## 2014-08-19 ENCOUNTER — Telehealth: Payer: Self-pay | Admitting: Hematology

## 2014-08-19 ENCOUNTER — Other Ambulatory Visit: Payer: Self-pay | Admitting: *Deleted

## 2014-08-19 NOTE — Telephone Encounter (Signed)
Left message to confirm appointment 06/13

## 2014-08-19 NOTE — OR Nursing (Signed)
Orest Dikes RN worked as the Market researcher for this case  Johnell Comings RN

## 2014-08-22 ENCOUNTER — Other Ambulatory Visit: Payer: Self-pay | Admitting: *Deleted

## 2014-08-22 ENCOUNTER — Ambulatory Visit: Payer: Medicare Other | Admitting: Oncology

## 2014-08-22 ENCOUNTER — Encounter: Payer: Medicare Other | Admitting: Nutrition

## 2014-08-22 ENCOUNTER — Telehealth: Payer: Self-pay | Admitting: *Deleted

## 2014-08-22 ENCOUNTER — Other Ambulatory Visit: Payer: Self-pay | Admitting: Hematology

## 2014-08-22 DIAGNOSIS — C20 Malignant neoplasm of rectum: Secondary | ICD-10-CM

## 2014-08-22 NOTE — Telephone Encounter (Addendum)
Left VM at her home # to call office and ask for Dr. Ernestina Penna nurse to reschedule her appointment. Needs to be seen this week to schedule PICC line and chemo to begin on 08/29/14. Also needs her chemo class.

## 2014-08-22 NOTE — Telephone Encounter (Signed)
TC from patient she had to cancel her appt today. She states she will call back to reschedule.

## 2014-08-23 ENCOUNTER — Telehealth: Payer: Self-pay | Admitting: *Deleted

## 2014-08-23 ENCOUNTER — Telehealth: Payer: Self-pay | Admitting: Hematology

## 2014-08-23 ENCOUNTER — Other Ambulatory Visit: Payer: Self-pay | Admitting: *Deleted

## 2014-08-23 ENCOUNTER — Ambulatory Visit: Payer: Medicare Other | Admitting: Radiation Oncology

## 2014-08-23 NOTE — Telephone Encounter (Signed)
Spoke with pt today and gave pt appts for lab and office visit with Dr. Burr Medico on Fri 08/26/14.  Informed pt that after visit with Dr. Burr Medico, pt will have PICC catheter insertion at 1230 pm at [facility] radiology.  Instructed pt to pick up appt calendar when pt comes in for chemo education on 08/25/14.  Pt voiced understanding.

## 2014-08-23 NOTE — Telephone Encounter (Signed)
per pof to sch pt appt-sent MWemail to sch pt trmt-will call after reply

## 2014-08-23 NOTE — Telephone Encounter (Signed)
LEFT SUSAN COWARD,RN GI NAVIGATOR A VOICE MAIL WITH PT.'S CONTACT INFORMATION.

## 2014-08-24 ENCOUNTER — Other Ambulatory Visit: Payer: Self-pay | Admitting: *Deleted

## 2014-08-24 ENCOUNTER — Ambulatory Visit: Payer: Medicare Other

## 2014-08-24 ENCOUNTER — Telehealth: Payer: Self-pay | Admitting: *Deleted

## 2014-08-24 ENCOUNTER — Telehealth: Payer: Self-pay | Admitting: Hematology

## 2014-08-24 DIAGNOSIS — C20 Malignant neoplasm of rectum: Secondary | ICD-10-CM

## 2014-08-24 NOTE — Telephone Encounter (Signed)
Per Dr. Lisbeth Renshaw patient Pet scan was rescheduled for today  From last week , cannot start radiation  This coming Monday 08/29/14 not enough time ,will have to put off another week, notified Dr. Lewayne Bunting nurse via voice message [phone], will in basket Dr. Burr Medico as well, cannot start  chemotherapy with the radiation on Monday 08/29/14 will put back another week per Dr. Lisbeth Renshaw 10:26 AM

## 2014-08-24 NOTE — Telephone Encounter (Signed)
Spoke with Tiffany in IR and changed pt's appt for PICC catheter insertion to  11 am on Fri  09/02/14 after office visit with Dr. Burr Medico.   Called pt on cell phone and left message on voice mail requesting a call back from pt.

## 2014-08-24 NOTE — Telephone Encounter (Signed)
Appointments made and per pof patient will get a new avs/calendar at chemo class 6/16 or at dr Burr Medico 6/17

## 2014-08-24 NOTE — Telephone Encounter (Signed)
Thanks for letting me know, Thayer Headings. I will plan to start chemo 6/27 then.    Thu, please postpone her picc placement and visit with me to next week (Thursday or Friday), and chemo on 6/27.  Thanks.  Krista Blue

## 2014-08-25 ENCOUNTER — Other Ambulatory Visit: Payer: Self-pay | Admitting: *Deleted

## 2014-08-25 ENCOUNTER — Encounter: Payer: Self-pay | Admitting: *Deleted

## 2014-08-25 ENCOUNTER — Other Ambulatory Visit: Payer: Medicare Other

## 2014-08-25 ENCOUNTER — Ambulatory Visit: Payer: Medicare Other

## 2014-08-25 ENCOUNTER — Encounter (HOSPITAL_COMMUNITY)
Admission: RE | Admit: 2014-08-25 | Discharge: 2014-08-25 | Disposition: A | Discharge status: Home / Self Care | Payer: Medicare Other | Source: Ambulatory Visit | Attending: Radiation Oncology | Admitting: Radiation Oncology

## 2014-08-25 DIAGNOSIS — I700 Atherosclerosis of aorta: Secondary | ICD-10-CM | POA: Insufficient documentation

## 2014-08-25 DIAGNOSIS — D3161 Benign neoplasm of unspecified site of right orbit: Secondary | ICD-10-CM | POA: Insufficient documentation

## 2014-08-25 DIAGNOSIS — D3162 Benign neoplasm of unspecified site of left orbit: Secondary | ICD-10-CM | POA: Insufficient documentation

## 2014-08-25 DIAGNOSIS — C210 Malignant neoplasm of anus, unspecified: Secondary | ICD-10-CM | POA: Insufficient documentation

## 2014-08-25 LAB — GLUCOSE, CAPILLARY: Glucose-Capillary: 107 mg/dL — ABNORMAL HIGH (ref 65–99)

## 2014-08-25 MED ORDER — FLUDEOXYGLUCOSE F - 18 (FDG) INJECTION
7.3000 | Freq: Once | INTRAVENOUS | Status: AC | PRN
  Administered 2014-08-25: 7.3 via INTRAVENOUS

## 2014-08-26 ENCOUNTER — Ambulatory Visit: Payer: Medicare Other

## 2014-08-26 ENCOUNTER — Ambulatory Visit: Payer: Medicare Other | Admitting: Hematology

## 2014-08-26 ENCOUNTER — Other Ambulatory Visit: Payer: Medicare Other

## 2014-08-26 ENCOUNTER — Other Ambulatory Visit (HOSPITAL_COMMUNITY): Payer: Medicare Other

## 2014-08-29 ENCOUNTER — Telehealth: Payer: Self-pay | Admitting: Family Medicine

## 2014-08-29 ENCOUNTER — Ambulatory Visit: Payer: Medicare Other

## 2014-08-29 ENCOUNTER — Ambulatory Visit: Payer: Medicare Other | Admitting: Radiation Oncology

## 2014-08-29 NOTE — Telephone Encounter (Signed)
Called and LMOVM for patient.  Social call.

## 2014-08-30 ENCOUNTER — Ambulatory Visit: Payer: Medicare Other

## 2014-08-30 NOTE — Addendum Note (Signed)
Encounter addended by: Kyung Rudd, MD on: 08/30/2014  3:37 PM<BR>     Documentation filed: Notes Section

## 2014-08-31 ENCOUNTER — Ambulatory Visit: Payer: Medicare Other

## 2014-08-31 NOTE — Telephone Encounter (Signed)
Called and talked to patient.  She thanked me for the call.  She wouldn't need pulmonary screening- I got a note from Eric Form- and the routine f/u for her current condition would cover that.   Patient will update me as needed.

## 2014-09-01 ENCOUNTER — Ambulatory Visit: Payer: Medicare Other

## 2014-09-02 ENCOUNTER — Ambulatory Visit (HOSPITAL_COMMUNITY)
Admission: RE | Admit: 2014-09-02 | Discharge: 2014-09-02 | Disposition: A | Discharge status: Home / Self Care | Payer: Medicare Other | Source: Ambulatory Visit | Attending: Hematology | Admitting: Hematology

## 2014-09-02 ENCOUNTER — Ambulatory Visit (HOSPITAL_BASED_OUTPATIENT_CLINIC_OR_DEPARTMENT_OTHER): Payer: Medicare Other | Admitting: Hematology

## 2014-09-02 ENCOUNTER — Other Ambulatory Visit: Payer: Self-pay | Admitting: Hematology

## 2014-09-02 ENCOUNTER — Ambulatory Visit: Payer: Medicare Other

## 2014-09-02 ENCOUNTER — Other Ambulatory Visit (HOSPITAL_BASED_OUTPATIENT_CLINIC_OR_DEPARTMENT_OTHER): Payer: Medicare Other

## 2014-09-02 VITALS — BP 144/75 | HR 81 | Temp 98.3°F | Resp 19 | Ht 62.0 in | Wt 150.7 lb

## 2014-09-02 DIAGNOSIS — Z720 Tobacco use: Secondary | ICD-10-CM | POA: Diagnosis not present

## 2014-09-02 DIAGNOSIS — C20 Malignant neoplasm of rectum: Secondary | ICD-10-CM | POA: Insufficient documentation

## 2014-09-02 LAB — CBC WITH DIFFERENTIAL/PLATELET
BASO%: 0.4 % (ref 0.0–2.0)
Basophils Absolute: 0 10*3/uL (ref 0.0–0.1)
EOS ABS: 0.3 10*3/uL (ref 0.0–0.5)
EOS%: 3.8 % (ref 0.0–7.0)
HCT: 43.4 % (ref 34.8–46.6)
HGB: 15 g/dL (ref 11.6–15.9)
LYMPH#: 1.7 10*3/uL (ref 0.9–3.3)
LYMPH%: 21.1 % (ref 14.0–49.7)
MCH: 30.7 pg (ref 25.1–34.0)
MCHC: 34.6 g/dL (ref 31.5–36.0)
MCV: 88.9 fL (ref 79.5–101.0)
MONO#: 0.7 10*3/uL (ref 0.1–0.9)
MONO%: 8.8 % (ref 0.0–14.0)
NEUT%: 65.9 % (ref 38.4–76.8)
NEUTROS ABS: 5.2 10*3/uL (ref 1.5–6.5)
PLATELETS: 200 10*3/uL (ref 145–400)
RBC: 4.88 10*6/uL (ref 3.70–5.45)
RDW: 12.8 % (ref 11.2–14.5)
WBC: 7.9 10*3/uL (ref 3.9–10.3)

## 2014-09-02 LAB — COMPREHENSIVE METABOLIC PANEL (CC13)
ALBUMIN: 3.6 g/dL (ref 3.5–5.0)
ALT: 16 U/L (ref 0–55)
ANION GAP: 7 meq/L (ref 3–11)
AST: 22 U/L (ref 5–34)
Alkaline Phosphatase: 54 U/L (ref 40–150)
BUN: 7.5 mg/dL (ref 7.0–26.0)
CALCIUM: 9.4 mg/dL (ref 8.4–10.4)
CO2: 28 meq/L (ref 22–29)
Chloride: 99 mEq/L (ref 98–109)
Creatinine: 0.7 mg/dL (ref 0.6–1.1)
EGFR: 86 mL/min/{1.73_m2} — AB (ref >90)
Glucose: 105 mg/dl (ref 70–140)
POTASSIUM: 4.5 meq/L (ref 3.5–5.1)
SODIUM: 134 meq/L — AB (ref 136–145)
TOTAL PROTEIN: 6.5 g/dL (ref 6.4–8.3)
Total Bilirubin: 0.3 mg/dL (ref 0.20–1.20)

## 2014-09-02 MED ORDER — LIDOCAINE HCL 1 % IJ SOLN
INTRAMUSCULAR | Status: AC
  Filled 2014-09-02: qty 20

## 2014-09-02 MED ORDER — HEPARIN SOD (PORK) LOCK FLUSH 100 UNIT/ML IV SOLN
500.0000 [IU] | Freq: Once | INTRAVENOUS | Status: AC
  Administered 2014-09-02: 500 [IU]

## 2014-09-02 MED ORDER — HEPARIN SOD (PORK) LOCK FLUSH 100 UNIT/ML IV SOLN
INTRAVENOUS | Status: AC
  Administered 2014-09-02: 500 [IU]
  Filled 2014-09-02: qty 5

## 2014-09-02 MED ORDER — TRAMADOL HCL 50 MG PO TABS: 50.0000 mg | ORAL_TABLET | Freq: Four times a day (QID) | ORAL | Status: DC | PRN

## 2014-09-02 MED ORDER — ONDANSETRON HCL 8 MG PO TABS: 8.0000 mg | ORAL_TABLET | Freq: Three times a day (TID) | ORAL | Status: DC | PRN

## 2014-09-02 NOTE — Procedures (Signed)
Successful RUE SL POWER PICC NO COMP STABLE TIP SVC/RA READY FOR USE FULL REPORT IN PACS

## 2014-09-02 NOTE — Progress Notes (Signed)
[place]  [phone] Fax:([phone]) [facility] Note   Patient Care Team: Tonia Ghent, MD as PCP - General (Family Medicine) 09/02/2014  CHIEF COMPLAINTS:  Follow up rectal squamous cell carcinoma  Oncology History   Presented with heme + stool, loose stools for 2 months     Rectal cancer   08/01/2014 Pathology Results Rectum, biopsy, distal mass - SQUAMOUS CELL CARCINOMA, BASALOID TYPE   08/01/2014 Procedure Colonoscopy-ulcerated, malignant appearing distal rectal mass adjacent to the internal anal sphincter. #14 polyps spread throughout the colon (#12 removed)   08/01/2014 Tumor Marker CEA=5.3   08/01/2014 Initial Diagnosis Rectal cancer   08/04/2014 Imaging CT C/A/P-No evidence of metastatic disease in chest, abdomen, or pelvis   08/18/2014 Procedure Colonoscopy and parotidectomy. Path reviewed tubular adenoma.   08/25/2014 Imaging PET IMPRESSION: 1. Hypermetabolic activity at the anus corresponding with known squamous cell carcinoma. No evidence of metastatic disease. 2. No other suspicious metabolic activity. 3. Diffuse atherosclerosis.    HISTORY OF PRESENTING ILLNESS:  Glenda Stevens 69 y.o. female is here because of recently diagnosed rectal squamous cell carcinoma.  She has been having diarrhea for the past 4 months, she has watery bowel movement 1-2 times daily, with some urgency, no blood, associated with abdominal cramps before the bowel movement. She denies any nausea or vomiting. She has good appetite, no weight loss. No fever or chills. She didn't seek medical attention until a month's ago she was in [place] by her primary care physician. Lab CBC and CMP was unremarkable, stool guaiac was positive. She was referred to gastroenterologist Dr. Ardis Hughs, and underwent her first colonoscopy on 08/01/2014. The colonoscopy showed a total of 14 polyps, 12 polyps were removed, and a distal rectal mass adjacent to the internal and no sphincter was  found, which was suspicious for malignancy. Biopsy showed squamous cell carcinoma. Staging scan CT showed no evidence of adenopathy or distant metastasis. She was referred to our cancer center to discuss chemotherapy and radiation. She is going to see radiation oncologist Dr. Lisbeth Renshaw this afternoon.  INTERIM HISTORY Glenda Stevens returns for follow-up. She has been doing well overall. Her diarrhea is stable, no bloody bowel movement. She denies any pain or other complaints. She underwent colonoscopy again on 08/15/2014 by Dr. Ardis Hughs, and had 2 additional polyps removed. She is scheduled to start chemotherapy and radiation next Monday.   MEDICAL HISTORY:  Past Medical History  Diagnosis Date  . Renal artery stenosis     left stent in 2003  . Renovascular hypertension   . Hypertension   . Hyperlipidemia   . GERD (gastroesophageal reflux disease)   . Arthritis   . History of chicken pox   . Allergy     Hay fever  . Phlebitis and thrombophlebitis   . Depression   . 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 PONV (postoperative nausea and vomiting)     SURGICAL HISTORY: Past Surgical History  Procedure Laterality Date  . Transthoracic echocardiogram  06/2012    EF 55-60%, mild MR  . Nm myocar perf wall motion  09/2009    bruce myoview - normal pattern of perfusion, EF 83%, low risk scan  . Renal doppler  09/2012    left renal artery stent - 60-99% diameter reduction  . Renal artery angioplasty  07/02/2001    6x11mm Genesis on Aviator balloon stent overlapping a 6x27mm Genesist on Aviator balloon stent (Dr. Marella Chimes)  . Nasal  sinus surgery    . Abdominal hysterectomy    . Cataract extraction Bilateral   . Breast surgery      bilateral lumpectomy-benign  . Colon resection N/A 08/18/2014    Procedure: LAPAROSCOPIC ASSISTED  COLON POLYP RESECTION , DIAGNOSTIC LAPROSCOPY ;  Surgeon: Leighton Ruff, MD;  Location: [facility];  Service: General;  Laterality: N/A;    SOCIAL  HISTORY: History   Social History  . Marital Status: Divorced    Spouse Name: N/A  . Number of Children: 1  . Years of Education: 16   Occupational History  . Education officer, museum    Social History Main Topics  . Smoking status: Current Every Day Smoker -- 1.00 packs/day for 30 years    Types: Cigarettes  . Smokeless tobacco: Never Used  . Alcohol Use: 4.8 - 5.4 oz/week    1-2 Glasses of wine, 7 Standard drinks or equivalent per week     Comment: daily -glass wine  . Drug Use: No  . Sexual Activity: Not on file   Other Topics Concern  . Not on file   Social History Narrative   Divorced   1 son, local who lives with her   Retired from [employer] work   Hydrologist grad   Has poodle named "Murphy"    FAMILY HISTORY: Family History  Problem Relation Age of Onset  . Hypertension Mother   . Heart disease Mother   . Dementia Mother   . Heart disease Father   . Hyperlipidemia Father   . Cancer Father     lung cancer and prostate cancer   . Colon cancer Neg Hx   . Breast cancer Cousin   . Cancer Cousin     4 paternal and 1 maternal cousins had breast cancer     ALLERGIES:  is allergic to crestor.  MEDICATIONS:  Current Outpatient Prescriptions  Medication Sig Dispense Refill  . ALPRAZolam (XANAX) 0.25 MG tablet Take 0.125 mg by mouth daily as needed for anxiety.    Marland Kitchen atenolol (TENORMIN) 50 MG tablet Take 1 tablet (50 mg total) by mouth daily. (Patient taking differently: Take 50 mg by mouth daily after supper. ) 90 tablet 3  . candesartan (ATACAND) 32 MG tablet Take 1 tablet (32 mg total) by mouth daily. Need appointment before anymore refills (Patient taking differently: Take 32 mg by mouth daily. ) 90 tablet 0  . cetirizine (ZYRTEC ALLERGY) 10 MG tablet Take 5 mg by mouth daily as needed for allergies.    . Multiple Vitamin (MULTIVITAMIN) tablet Take 1 tablet by mouth daily.    . ranitidine (ZANTAC) 150 MG tablet Take 150 mg by mouth daily as needed for heartburn.    .  sertraline (ZOLOFT) 100 MG tablet Take 100 mg by mouth daily.      No current facility-administered medications for this visit.    REVIEW OF SYSTEMS:   Constitutional: Denies fevers, chills or abnormal night sweats Eyes: Denies blurriness of vision, double vision or watery eyes Ears, nose, mouth, throat, and face: Denies mucositis or sore throat Respiratory: Denies cough, dyspnea or wheezes Cardiovascular: Denies palpitation, chest discomfort or lower extremity swelling Gastrointestinal:  Denies nausea, heartburn or change in bowel habits Skin: Denies abnormal skin rashes Lymphatics: Denies new lymphadenopathy or easy bruising Neurological:Denies numbness, tingling or new weaknesses Behavioral/Psych: Mood is stable, no new changes  All other systems were reviewed with the patient and are negative.  PHYSICAL EXAMINATION: ECOG PERFORMANCE STATUS: 1 - Symptomatic but completely  ambulatory  Filed Vitals:   09/02/14 0946  BP: 144/75  Pulse: 81  Temp: 98.3 F (36.8 C)  Resp: 19   Filed Weights   09/02/14 0946  Weight: 150 lb 11.2 oz (68.357 kg)    GENERAL:alert, no distress and comfortable SKIN: skin color, texture, turgor are normal, no rashes or significant lesions EYES: normal, conjunctiva are pink and non-injected, sclera clear OROPHARYNX:no exudate, no erythema and lips, buccal mucosa, and tongue normal  NECK: supple, thyroid normal size, non-tender, without nodularity LYMPH:  no palpable lymphadenopathy in the cervical, axillary or bilaterla inguinal LUNGS: clear to auscultation and percussion with normal breathing effort HEART: regular rate & rhythm and no murmurs and no lower extremity edema ABDOMEN:abdomen soft, non-tender and normal bowel sounds. Rect exam showed a palpable mass at the low rectum area, no overt bleeding. Perianal area no lesions.   Musculoskeletal:no cyanosis of digits and no clubbing  PSYCH: alert & oriented x 3 with fluent speech NEURO: no focal  motor/sensory deficits  LABORATORY DATA:  CBC Latest Ref Rng 09/02/2014 08/17/2014 07/07/2014  WBC 3.9 - 10.3 10e3/uL 7.9 6.6 8.0  Hemoglobin 11.6 - 15.9 g/dL 15.0 15.3(H) 14.9  Hematocrit 34.8 - 46.6 % 43.4 45.4 43.6  Platelets 145 - 400 10e3/uL 200 215 262.0    CMP Latest Ref Rng 09/02/2014 08/17/2014 08/01/2014  Glucose 70 - 140 mg/dl 105 120(H) -  BUN 7.0 - 26.0 mg/dL 7.5 10 -  Creatinine 0.6 - 1.1 mg/dL 0.7 0.71 0.62  Sodium 136 - 145 mEq/L 134(L) 136 -  Potassium 3.5 - 5.1 mEq/L 4.5 4.3 -  Chloride 101 - 111 mmol/L - 101 -  CO2 22 - 29 mEq/L 28 28 -  Calcium 8.4 - 10.4 mg/dL 9.4 9.3 -  Total Protein 6.4 - 8.3 g/dL 6.5 - -  Total Bilirubin 0.20 - 1.20 mg/dL 0.30 - -  Alkaline Phos 40 - 150 U/L 54 - -  AST 5 - 34 U/L 22 - -  ALT 0 - 55 U/L 16 - -    PATHOLOGY REPORT: Diagnosis 08/01/2014  1. Colon, biopsy, ascending and cecum, polyp (7) - TUBULAR ADENOMAS (7). NO HIGH GRADE DYSPLASIA OR MALIGNANCY IDENTIFIED. 2. Colon, biopsy, hepatic flexure, polyp (3) - TUBULAR ADENOMAS (3). NO HIGH GRADE DYSPLASIA OR MALIGNANCY IDENTIFIED. 3. Colon, biopsy, transverse - TWO FRAGMENTS OF TUBULAR ADENOMA. NO HIGH GRADE DYSPLASIA OR MALIGNANCY IDENTIFIED. 4. Colon, biopsy, descending, sigmoid - TUBULAR ADENOMAS (3). NO HIGH GRADE DYSPLASIA OR MALIGNANCY IDENTIFIED. 5. Rectum, biopsy, distal mass - SQUAMOUS CELL CARCINOMA, BASALOID TYPE. - SEE MICROSCOPIC DESCRIPTION.  Colon, polyp(s), distal ascending 08/18/2014 - MULTIPLE FRAGMENTS OF TUBULAR ADENOMA. - NO HIGH GRADE DYSPLASIA OR MALIGNANCY. RADIOGRAPHIC STUDIES:  Ct Chest, Abdomen Pelvis W Contrast 08/04/2014   IMPRESSION: 1. No acute process or evidence of metastatic disease in the chest, abdomen, or pelvis. 2. Mild motion degradation involving the abdomen and upper pelvis. 3. Advanced atherosclerosis, including within the coronary arteries.   Electronically Signed   By: Abigail Miyamoto M.D.   On: 08/04/2014 16:42   Colonoscopy  08/01/2014 They were adjacent to eachother. They were located on both sides of a fold, sessile, slightly firm. The largest of the two (58mm) was biopsied and then the site was labled with SPOT via submucosal injection. These biopsies were in jar #3. The other 12 polyp were all sessile, located in cecum, ascending, hepatic flexure, descending and sigmoid. Two were removed with snare cautery (ascending and sigmoid), and the rest removed  with cold snare. Sent in three different jars (#1 ascending, cecum), (#2hepatic flexure), (#4 descending, sigmoid). There was also an ulcerated, malignant appearing distal rectal mass that was adjacent to the internal anal sphincter. This was non-cirumferential, 3-5cm across, friable, biopsied extensively (#5). There were 8-10 diminutive polyps spread throughout the colon that I did not remove (2-14mm each). Retroflexed views revealed no abnormalities. The time to cecum = 13min Withdrawal time = 15min The scope was withdrawn and the procedure completed.  COMPLICATIONS: There were no immediate complications.  ENDOSCOPIC IMPRESSION: Multiple sites of significant neoplasia, see above   PET 08/25/2014 IMPRESSION: 1. Hypermetabolic activity at the anus corresponding with known squamous cell carcinoma. No evidence of metastatic disease. 2. No other suspicious metabolic activity. 3. Diffuse atherosclerosis. 4. 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  COLONOSCOPY 08/18/2014 ENDOSCOPIC IMPRESSION: In the distal ascending colon, near the hepatic flexure the two sessile polyps were again located. One was 1cm, the other about 1.7cm along a fold. Neither now appeared cancerous. Previous tatoo at the site was somewhat visible. With external pressure and manipulation of the colon by Dr. Marcello Moores using laparscopic technique I was able to resect the polyps. I used saline pressure injector to lift the polyps and then  piecemeal snare/cautery technique. I felt there was a small amount of remaining polyp tissue at the site and I used snare tip cautery to cauterize it. Three other small (2-68mm) clearly adenomatous polyps were removed from the right colon (not retrieved). The examination was otherwise normal  RECOMMENDATIONS: Await final pathology, likely you will need repeat colonoscopy in 1 year.   ASSESSMENT & PLAN:  69 year old Caucasian female with past medical history of hypertension, anxiety and depression, mild arthritis, who presents with watery diarrhea for 4 months.  1. Rectal squamous cell carcinoma -I reviewed her colonoscopy findings, the rectal mass biopsy results with patient and her son in great details. -We reviewed the natural history of rectal cancer, and staging workup. CT scans reviewed no local adenopathy or distant metastasis. I think she most likely have early stage rectal cancer. -Giving the tumor location in the low rectum, and squamous cell histology, I think concurrent chemoradiation is favored. I would recommend IV 5-FU and mitomycin chemotherapy with concurrent radiation, on day 1-5, and day 28-32, similar to anal cancer regimen. The goal of treatment is curative --Chemotherapy consent: Side effects including but does not not limited to, fatigue, nausea, vomiting, diarrhea, hair loss, neuropathy, fluid retention, renal and kidney dysfunction, neutropenic fever, needed for blood transfusion, bleeding, coronary artery spasm and heart attack, were discussed with patient in great detail. She agrees to proceed. Burnis Medin start concurrent chemotherapy and radiation next Monday, June 27 -Weekly lab after chemotherapy, we'll see her every 1-2 weeks during the treatment course   2. Smoking cessation -We discussed the smoking cessation extensively today, she has tried quit smoking a few times in the past and failed. -She states she is not ready to quit smoking, when she is going through the cancer  diagnosis and treatment. She'll consider after she completes all treatments.  3. Hypertension, anxiety -She'll continue follow-up with her primary care physician  Plan -PICC line placement today -Concurrent chemoradiation with 5-FU and mitomycin starting next Monday 6/27 -I'll see her back in 2 weeks, level was CBC and CMP every Monday  All questions were answered. The patient knows to call the clinic with any problems, questions or concerns. I spent 25 minutes counseling the patient  face to face. The total time spent in the appointment was 30 minutes and more than 50% was on counseling.     Truitt Merle, MD 09/02/2014 10:19 AM

## 2014-09-03 ENCOUNTER — Ambulatory Visit: Payer: Medicare Other | Admitting: Radiation Oncology

## 2014-09-04 ENCOUNTER — Other Ambulatory Visit: Payer: Self-pay | Admitting: Internal Medicine

## 2014-09-05 ENCOUNTER — Ambulatory Visit (HOSPITAL_BASED_OUTPATIENT_CLINIC_OR_DEPARTMENT_OTHER): Payer: Medicare Other

## 2014-09-05 ENCOUNTER — Encounter: Payer: Self-pay | Admitting: Hematology

## 2014-09-05 ENCOUNTER — Ambulatory Visit
Admission: RE | Admit: 2014-09-05 | Discharge: 2014-09-05 | Disposition: A | Discharge status: Home / Self Care | Payer: Medicare Other | Source: Ambulatory Visit | Attending: Radiation Oncology | Admitting: Radiation Oncology

## 2014-09-05 VITALS — BP 151/64 | HR 65 | Temp 98.3°F | Resp 18

## 2014-09-05 DIAGNOSIS — C20 Malignant neoplasm of rectum: Secondary | ICD-10-CM

## 2014-09-05 DIAGNOSIS — Z5111 Encounter for antineoplastic chemotherapy: Secondary | ICD-10-CM

## 2014-09-05 MED ORDER — SODIUM CHLORIDE 0.9 % IV SOLN
1000.0000 mg/m2/d | INTRAVENOUS | Status: DC
  Administered 2014-09-05: 7000 mg via INTRAVENOUS
  Filled 2014-09-05: qty 140

## 2014-09-05 MED ORDER — MITOMYCIN CHEMO IV INJECTION 20 MG
10.0000 mg/m2 | Freq: Once | INTRAVENOUS | Status: AC
  Administered 2014-09-05: 17.5 mg via INTRAVENOUS
  Filled 2014-09-05: qty 35

## 2014-09-05 MED ORDER — SODIUM CHLORIDE 0.9 % IV SOLN
Freq: Once | INTRAVENOUS | Status: AC
  Administered 2014-09-05: 13:00:00 via INTRAVENOUS

## 2014-09-05 MED ORDER — SODIUM CHLORIDE 0.9 % IV SOLN
Freq: Once | INTRAVENOUS | Status: AC
  Administered 2014-09-05: 13:00:00 via INTRAVENOUS
  Filled 2014-09-05: qty 4

## 2014-09-05 NOTE — Progress Notes (Addendum)
Patient educatin done, Radiation therapy and you book  Given along with my business card and a sitz bath, discussed ways to manage symptoms/side effects, Fatigue, Nausea,vomiting diarrhea, pain, skin irritation, bladder changes, may need to eat 5-6 smaller meals instead of 3 large ones , low fiber diet,no fresh fruit or fresh vegetables if having diarrhea, imodium prn,, baby wipes, dove unscented soap,   drink plenty water, increase protein in diet, weill see MD weekly and prn if needed, verbal understanding, teach back method, by patient 3:05 PM

## 2014-09-05 NOTE — Patient Instructions (Signed)
[facility] Discharge Instructions for Patients Receiving Chemotherapy  Today you received the following chemotherapy agents: Mitomycin and Adrucil.  To help prevent nausea and vomiting after your treatment, we encourage you to take your nausea medication as directed.   If you develop nausea and vomiting that is not controlled by your nausea medication, call the clinic.   BELOW ARE SYMPTOMS THAT SHOULD BE REPORTED IMMEDIATELY:  *FEVER GREATER THAN 100.5 F  *CHILLS WITH OR WITHOUT FEVER  NAUSEA AND VOMITING THAT IS NOT CONTROLLED WITH YOUR NAUSEA MEDICATION  *UNUSUAL SHORTNESS OF BREATH  *UNUSUAL BRUISING OR BLEEDING  TENDERNESS IN MOUTH AND THROAT WITH OR WITHOUT PRESENCE OF ULCERS  *URINARY PROBLEMS  *BOWEL PROBLEMS  UNUSUAL RASH Items with * indicate a potential emergency and should be followed up as soon as possible.  Feel free to call the clinic you have any questions or concerns. The clinic phone number is [phone].  Please show the CHEMO ALERT CARD at check-in to the Emergency Department and triage nurse.   

## 2014-09-06 ENCOUNTER — Telehealth: Payer: Self-pay | Admitting: *Deleted

## 2014-09-06 ENCOUNTER — Ambulatory Visit
Admission: RE | Admit: 2014-09-06 | Discharge: 2014-09-06 | Disposition: A | Discharge status: Home / Self Care | Payer: Medicare Other | Source: Ambulatory Visit | Attending: Radiation Oncology | Admitting: Radiation Oncology

## 2014-09-06 ENCOUNTER — Telehealth: Payer: Self-pay | Admitting: Hematology

## 2014-09-06 DIAGNOSIS — C20 Malignant neoplasm of rectum: Secondary | ICD-10-CM | POA: Diagnosis not present

## 2014-09-06 NOTE — Telephone Encounter (Signed)
Per staff message and POF I have scheduled appts. Advised scheduler of appts. JMW  

## 2014-09-06 NOTE — Telephone Encounter (Signed)
per pof to sch pt appt-sent MW email to sch pt trmt-per reply fropm Burr Medico to skip MD 7/5 and sch w/APP 7/12 and Burr Medico 7/19-will call pt once reply from inf

## 2014-09-07 ENCOUNTER — Ambulatory Visit
Admission: RE | Admit: 2014-09-07 | Discharge: 2014-09-07 | Disposition: A | Discharge status: Home / Self Care | Payer: Medicare Other | Source: Ambulatory Visit | Attending: Radiation Oncology | Admitting: Radiation Oncology

## 2014-09-07 ENCOUNTER — Other Ambulatory Visit: Payer: Self-pay | Admitting: *Deleted

## 2014-09-07 ENCOUNTER — Telehealth: Payer: Self-pay | Admitting: *Deleted

## 2014-09-07 DIAGNOSIS — C20 Malignant neoplasm of rectum: Secondary | ICD-10-CM | POA: Diagnosis not present

## 2014-09-07 MED ORDER — CANDESARTAN CILEXETIL 32 MG PO TABS: 32.0000 mg | ORAL_TABLET | Freq: Every day | ORAL | Status: DC

## 2014-09-07 NOTE — Telephone Encounter (Signed)
Called pt to f/u after chemo 09/05/14.  She reports that she is doing well.  She does state that she hasn't had a BM today.  Instructed to drink plenty of fluids & up roughage/fiber in diet & if this isn't successful can try prune juice or stool softener. She also report mouth is burning a little.  Discussed Biotene & baking soda/salt water mouth wash.  Instructed to call if worse.  She knows that she can call for any problems or concerns.

## 2014-09-08 ENCOUNTER — Ambulatory Visit
Admission: RE | Admit: 2014-09-08 | Discharge: 2014-09-08 | Disposition: A | Discharge status: Home / Self Care | Payer: Medicare Other | Source: Ambulatory Visit | Attending: Radiation Oncology | Admitting: Radiation Oncology

## 2014-09-08 DIAGNOSIS — C20 Malignant neoplasm of rectum: Secondary | ICD-10-CM | POA: Diagnosis not present

## 2014-09-09 ENCOUNTER — Ambulatory Visit (HOSPITAL_BASED_OUTPATIENT_CLINIC_OR_DEPARTMENT_OTHER): Payer: Medicare Other

## 2014-09-09 ENCOUNTER — Other Ambulatory Visit: Payer: Self-pay | Admitting: *Deleted

## 2014-09-09 ENCOUNTER — Ambulatory Visit
Admission: RE | Admit: 2014-09-09 | Discharge: 2014-09-09 | Disposition: A | Discharge status: Home / Self Care | Payer: Medicare Other | Source: Ambulatory Visit | Attending: Radiation Oncology | Admitting: Radiation Oncology

## 2014-09-09 DIAGNOSIS — C20 Malignant neoplasm of rectum: Secondary | ICD-10-CM

## 2014-09-09 MED ORDER — SODIUM CHLORIDE 0.9 % IJ SOLN
10.0000 mL | INTRAMUSCULAR | Status: DC | PRN
  Administered 2014-09-09: 10 mL
  Filled 2014-09-09: qty 10

## 2014-09-09 NOTE — Patient Instructions (Signed)
PICC Removal, Care After   Refer to this sheet in the next few weeks. These instructions provide you with information on caring for yourself after your procedure. Your health care provider may also give you more specific instructions. Your treatment has been planned according to current medical practices, but problems sometimes occur. Call your health care provider if you have any problems or questions after your procedure.  WHAT TO EXPECT AFTER THE PROCEDURE  After your procedure, it is typical to have mild discomfort at the insertion site. This should not last for more than a day.  HOME CARE INSTRUCTIONS  You may remove the bandage after 24 hours. The PICC insertion site is very small. A small scab may develop over the insertion site.   It is okay to wash the site gently with soap and water. Be careful not to remove or pick off the scab. Gently pat the site dry after washing it. You do not need to put another bandage over the insertion site.  Do not lift anything heavy or do strenuous physical activity for 24 hours after the PICC is removed. This includes:   Weight lifting.   Strenuous yard work.   Any physical activity with repetitive arm movement.   SEEK MEDICAL CARE IF:    You have swelling or puffiness in your arm at the PICC insertion site.   You have increasing tenderness at the PICC insertion site.  SEEK IMMEDIATE MEDICAL CARE IF:    You have numbness or tingling in your fingers, hand, or arm.   Your arm looks blue and feels cold.   You have redness around the insertion site or a red streak goes up your arm.   You have any type of drainage from the PICC insertion site. This includes drainage such as:   Bleeding from the insertion site. If this happens, apply firm, direct pressure to the PICC insertion site with a clean towel.   Drainage that is yellow or tan.   You have a fever.  Document Released: 03/02/2013 Document Reviewed: 03/02/2013  ExitCare Patient Information 2015 [employer].  This information is not intended to replace advice given to you by your health care provider. Make sure you discuss any questions you have with your health care provider.

## 2014-09-09 NOTE — Progress Notes (Signed)
Pt in for pump DC.  States that her PICC line is to be removed today.  Hinda Lenis, RN to remove PICC line.

## 2014-09-10 ENCOUNTER — Other Ambulatory Visit: Payer: Self-pay | Admitting: Internal Medicine

## 2014-09-10 ENCOUNTER — Ambulatory Visit: Payer: Medicare Other

## 2014-09-13 ENCOUNTER — Telehealth: Payer: Self-pay | Admitting: *Deleted

## 2014-09-13 ENCOUNTER — Ambulatory Visit
Admission: RE | Admit: 2014-09-13 | Discharge: 2014-09-13 | Disposition: A | Discharge status: Home / Self Care | Payer: Medicare Other | Source: Ambulatory Visit | Attending: Radiation Oncology | Admitting: Radiation Oncology

## 2014-09-13 ENCOUNTER — Encounter: Payer: Self-pay | Admitting: Radiation Oncology

## 2014-09-13 ENCOUNTER — Other Ambulatory Visit: Payer: Self-pay | Admitting: *Deleted

## 2014-09-13 ENCOUNTER — Other Ambulatory Visit: Payer: Medicare Other

## 2014-09-13 ENCOUNTER — Other Ambulatory Visit (HOSPITAL_BASED_OUTPATIENT_CLINIC_OR_DEPARTMENT_OTHER): Payer: Medicare Other

## 2014-09-13 VITALS — BP 134/62 | HR 88 | Temp 98.3°F | Resp 16 | Wt 148.7 lb

## 2014-09-13 DIAGNOSIS — C210 Malignant neoplasm of anus, unspecified: Secondary | ICD-10-CM

## 2014-09-13 DIAGNOSIS — C20 Malignant neoplasm of rectum: Secondary | ICD-10-CM | POA: Diagnosis not present

## 2014-09-13 LAB — CBC WITH DIFFERENTIAL/PLATELET
BASO%: 0.1 % (ref 0.0–2.0)
Basophils Absolute: 0 10*3/uL (ref 0.0–0.1)
EOS ABS: 0.1 10*3/uL (ref 0.0–0.5)
EOS%: 1.9 % (ref 0.0–7.0)
HEMATOCRIT: 39.5 % (ref 34.8–46.6)
HEMOGLOBIN: 13.3 g/dL (ref 11.6–15.9)
LYMPH%: 10.3 % — ABNORMAL LOW (ref 14.0–49.7)
MCH: 30.6 pg (ref 25.1–34.0)
MCHC: 33.6 g/dL (ref 31.5–36.0)
MCV: 91 fL (ref 79.5–101.0)
MONO#: 0.4 10*3/uL (ref 0.1–0.9)
MONO%: 7.9 % (ref 0.0–14.0)
NEUT%: 79.8 % — ABNORMAL HIGH (ref 38.4–76.8)
NEUTROS ABS: 3.8 10*3/uL (ref 1.5–6.5)
PLATELETS: 187 10*3/uL (ref 145–400)
RBC: 4.34 10*6/uL (ref 3.70–5.45)
RDW: 12.7 % (ref 11.2–14.5)
WBC: 4.7 10*3/uL (ref 3.9–10.3)
lymph#: 0.5 10*3/uL — ABNORMAL LOW (ref 0.9–3.3)

## 2014-09-13 LAB — COMPREHENSIVE METABOLIC PANEL (CC13)
ALK PHOS: 48 U/L (ref 40–150)
ALT: 15 U/L (ref 0–55)
AST: 18 U/L (ref 5–34)
Albumin: 3.4 g/dL — ABNORMAL LOW (ref 3.5–5.0)
Anion Gap: 9 mEq/L (ref 3–11)
BUN: 12.9 mg/dL (ref 7.0–26.0)
CALCIUM: 9.5 mg/dL (ref 8.4–10.4)
CO2: 23 mEq/L (ref 22–29)
CREATININE: 0.8 mg/dL (ref 0.6–1.1)
Chloride: 102 mEq/L (ref 98–109)
EGFR: 79 mL/min/{1.73_m2} — ABNORMAL LOW (ref >90)
Glucose: 108 mg/dl (ref 70–140)
Potassium: 4.4 mEq/L (ref 3.5–5.1)
Sodium: 134 mEq/L — ABNORMAL LOW (ref 136–145)
Total Bilirubin: 0.66 mg/dL (ref 0.20–1.20)
Total Protein: 6.5 g/dL (ref 6.4–8.3)

## 2014-09-13 MED ORDER — CANDESARTAN CILEXETIL 32 MG PO TABS: 32.0000 mg | ORAL_TABLET | Freq: Every day | ORAL | Status: DC

## 2014-09-13 MED ORDER — MAGIC MOUTHWASH: 5.0000 mL | Freq: Three times a day (TID) | ORAL | Status: DC | PRN

## 2014-09-13 NOTE — Telephone Encounter (Signed)
Oncology Nurse Navigator Documentation  Oncology Nurse Navigator Flowsheets 09/13/2014  Referral date to RadOnc/MedOnc -  Navigator Encounter Type Telephone  Patient Visit Type -  Treatment Phase First Chemo Tx-5 FU/Mitomycin C 6/27 to 7/01  Barriers/Navigation Needs Coordination of care-see NP tomorrow and IV fluids  Education Reviewed management of mucositis-will pick up MMW script  Referrals -  Education Method Verbal-teach back  Support Groups/Services -  Time Spent with Patient 10  Not eating-all food hurts mouth and painful to swallow. Sips water only, and not much. Tylenol seems to help her pain more than Tramadol. She is requesting to be seen and get IV fluids tomorrow. Will discuss with collaborative nurse/NP.

## 2014-09-13 NOTE — Progress Notes (Addendum)
Weekly Management Note Current Dose:9 Gy  Projected Dose:50.4 Gy   Narrative:  The patient presents for routine under treatment assessment.  CBCT/MVCT images/Port film x-rays were reviewed.  The chart was checked. Mucocitis on tongue/mouth. Hard to swallow. Salt rinses not helping. Diarreah controlled.   Physical Findings: Mucocitis over tongue and posterior oropharynx.   Vitals:  Filed Vitals:   09/13/14 1257  BP: 134/62  Pulse: 88  Temp: 98.3 F (36.8 C)  Resp: 16   Weight:  Wt Readings from Last 3 Encounters:  09/13/14 148 lb 11.2 oz (67.45 kg)  09/02/14 150 lb 11.2 oz (68.357 kg)  08/17/14 153 lb 6.4 oz (69.582 kg)   Lab Results  Component Value Date   WBC 4.7 09/13/2014   HGB 13.3 09/13/2014   HCT 39.5 09/13/2014   MCV 91.0 09/13/2014   PLT 187 09/13/2014   Lab Results  Component Value Date   CREATININE 0.8 09/13/2014   BUN 12.9 09/13/2014   NA 134* 09/13/2014   K 4.4 09/13/2014   CL 101 08/17/2014   CO2 23 09/13/2014     Impression:  The patient is tolerating radiation.  Plan:  Continue treatment as planned. Script sent for MMW.

## 2014-09-13 NOTE — Telephone Encounter (Signed)
+  called  [facility] pharmacy at [phone], they do not make compund formulas for mmw, , so called [place] spoke with [facility], equal parts of mallox plus, benadryl liquid 12.5mg /86ml,nystatin oral suspension 100,000units/ml, to equal 160 ml each to make 471ml, can give 1 refill spoke with [facility], and then went and spoke with patient sun to pick up at [facility] -  out patint pharmacy , take 66ml by mouth 3x day as needed for mouth pain per MD 1:25 PM

## 2014-09-13 NOTE — Progress Notes (Signed)
NP agrees to see patient tomorrow and assess for IV fluid needs. POF to scheduler.

## 2014-09-13 NOTE — Progress Notes (Addendum)
Weekly rad txs, has completed 5/5 ,  Last given, 09/09/14   Anal , has had 1 bout diarrhea today takes imodium prn,  Herc/o increased pain with mucositis, on tongue,  And where she swallows difficult swallowing, it burs and unberable, started last Friday, has sinus  Problems as well,   Stuff in mouth thick saliva, using warm salt water rinses and biotene, last Friday 35fu  completed 1:02 PM  BP 134/62 mmHg  Pulse 88  Temp(Src) 98.3 F (36.8 C) (Oral)  Resp 16  Wt 148 lb 11.2 oz (67.45 kg)  Wt Readings from Last 3 Encounters:  09/13/14 148 lb 11.2 oz (67.45 kg)  09/02/14 150 lb 11.2 oz (68.357 kg)  08/17/14 153 lb 6.4 oz (69.582 kg)

## 2014-09-13 NOTE — Telephone Encounter (Signed)
Called Dr. Ernestina Penna nurse voice mail ,left message that patient seen here for Rectal radaiation, but c/o mucositis in mouth since Friday,"Burning in throat,difficult eating/swallowing", using warm salt rinses and biotene asking for something  For patient,  But called voice mail back nurse , Dr. Pablo Ledger e-scribed MMW for patient instead 1:12 PM

## 2014-09-13 NOTE — Telephone Encounter (Signed)
error 

## 2014-09-13 NOTE — Addendum Note (Signed)
Encounter addended by: Thea Silversmith, MD on: 09/13/2014  1:40 PM<BR>     Documentation filed: Notes Section

## 2014-09-14 ENCOUNTER — Telehealth: Payer: Self-pay | Admitting: *Deleted

## 2014-09-14 ENCOUNTER — Ambulatory Visit (HOSPITAL_BASED_OUTPATIENT_CLINIC_OR_DEPARTMENT_OTHER): Payer: Medicare Other | Admitting: Nurse Practitioner

## 2014-09-14 ENCOUNTER — Ambulatory Visit: Payer: Medicare Other

## 2014-09-14 ENCOUNTER — Encounter: Payer: Self-pay | Admitting: Radiation Oncology

## 2014-09-14 ENCOUNTER — Ambulatory Visit
Admission: RE | Admit: 2014-09-14 | Discharge: 2014-09-14 | Disposition: A | Discharge status: Home / Self Care | Payer: Medicare Other | Source: Ambulatory Visit | Attending: Radiation Oncology | Admitting: Radiation Oncology

## 2014-09-14 ENCOUNTER — Other Ambulatory Visit: Payer: Self-pay | Admitting: *Deleted

## 2014-09-14 ENCOUNTER — Ambulatory Visit (HOSPITAL_BASED_OUTPATIENT_CLINIC_OR_DEPARTMENT_OTHER): Payer: Medicare Other

## 2014-09-14 VITALS — BP 159/82 | HR 92 | Resp 20 | Wt 148.2 lb

## 2014-09-14 VITALS — BP 133/113 | HR 94 | Temp 98.5°F | Resp 20 | Wt 148.6 lb

## 2014-09-14 DIAGNOSIS — E860 Dehydration: Secondary | ICD-10-CM

## 2014-09-14 DIAGNOSIS — B379 Candidiasis, unspecified: Secondary | ICD-10-CM | POA: Diagnosis not present

## 2014-09-14 DIAGNOSIS — C20 Malignant neoplasm of rectum: Secondary | ICD-10-CM | POA: Diagnosis not present

## 2014-09-14 DIAGNOSIS — K1231 Oral mucositis (ulcerative) due to antineoplastic therapy: Secondary | ICD-10-CM | POA: Diagnosis not present

## 2014-09-14 LAB — CBC WITH DIFFERENTIAL/PLATELET
BASO%: 0.4 % (ref 0.0–2.0)
Basophils Absolute: 0 10*3/uL (ref 0.0–0.1)
EOS%: 1.8 % (ref 0.0–7.0)
Eosinophils Absolute: 0.1 10*3/uL (ref 0.0–0.5)
HCT: 39.4 % (ref 34.8–46.6)
HGB: 13.3 g/dL (ref 11.6–15.9)
LYMPH%: 17.6 % (ref 14.0–49.7)
MCH: 30.4 pg (ref 25.1–34.0)
MCHC: 33.8 g/dL (ref 31.5–36.0)
MCV: 89.8 fL (ref 79.5–101.0)
MONO#: 0.4 10*3/uL (ref 0.1–0.9)
MONO%: 13.1 % (ref 0.0–14.0)
NEUT#: 2.2 10*3/uL (ref 1.5–6.5)
NEUT%: 67.1 % (ref 38.4–76.8)
Platelets: 179 10*3/uL (ref 145–400)
RBC: 4.39 10*6/uL (ref 3.70–5.45)
RDW: 12.2 % (ref 11.2–14.5)
WBC: 3.2 10*3/uL — AB (ref 3.9–10.3)
lymph#: 0.6 10*3/uL — ABNORMAL LOW (ref 0.9–3.3)

## 2014-09-14 LAB — COMPREHENSIVE METABOLIC PANEL (CC13)
ALBUMIN: 3.5 g/dL (ref 3.5–5.0)
ALK PHOS: 52 U/L (ref 40–150)
ALT: 15 U/L (ref 0–55)
AST: 19 U/L (ref 5–34)
Anion Gap: 9 mEq/L (ref 3–11)
BUN: 12.1 mg/dL (ref 7.0–26.0)
CO2: 23 mEq/L (ref 22–29)
Calcium: 9.7 mg/dL (ref 8.4–10.4)
Chloride: 101 mEq/L (ref 98–109)
Creatinine: 0.7 mg/dL (ref 0.6–1.1)
EGFR: 87 mL/min/{1.73_m2} — ABNORMAL LOW (ref >90)
GLUCOSE: 100 mg/dL (ref 70–140)
POTASSIUM: 4.4 meq/L (ref 3.5–5.1)
Sodium: 133 mEq/L — ABNORMAL LOW (ref 136–145)
Total Bilirubin: 0.45 mg/dL (ref 0.20–1.20)
Total Protein: 6.8 g/dL (ref 6.4–8.3)

## 2014-09-14 NOTE — Progress Notes (Signed)
Seen yesterday by Dr. Pablo Ledger, has mucositis in mouth and throat, started her on MMW, has had 7/28  Rad txs anal, will see NP at 2pm today may need fluids, difficulty swallowing, poor appetite, ice cream and eggs able to get down, carnation instant breakfast this am, chicken broth  1:39 PM BP 133/113 mmHg  Pulse 94  Temp(Src) 98.5 F (36.9 C) (Oral)  Resp 20  Wt 148 lb 9.6 oz (67.405 kg)  Wt Readings from Last 3 Encounters:  09/14/14 148 lb 9.6 oz (67.405 kg)  09/13/14 148 lb 11.2 oz (67.45 kg)  09/02/14 150 lb 11.2 oz (68.357 kg)

## 2014-09-14 NOTE — Progress Notes (Signed)
Department of Radiation Oncology  Phone:  [phone] Fax:        [phone]  Weekly Treatment Note    Name: Glenda Stevens Date: 09/14/2014 MRN: [id number] DOB: October 11, 1945   Current dose: 12.6 Gy  Current fraction: 7   MEDICATIONS: Current Outpatient Prescriptions  Medication Sig Dispense Refill  . ALPRAZolam (XANAX) 0.25 MG tablet Take 0.125 mg by mouth daily as needed for anxiety.    . Alum & Mag Hydroxide-Simeth (MAGIC MOUTHWASH) SOLN Take 5 mLs by mouth 3 (three) times daily as needed for mouth pain. 500 mL 1  . Alum & Mag Hydroxide-Simeth (MAGIC MOUTHWASH) SOLN Take 5 mLs by mouth 3 (three) times daily as needed for mouth pain. Called [facility] out patient pharmacy    . atenolol (TENORMIN) 50 MG tablet Take 1 tablet (50 mg total) by mouth daily. (Patient taking differently: Take 50 mg by mouth daily after supper. ) 90 tablet 3  . candesartan (ATACAND) 32 MG tablet Take 1 tablet (32 mg total) by mouth daily. <PLEASE MAKE APPOINTMENT FOR REFILLS> 90 tablet 0  . cetirizine (ZYRTEC ALLERGY) 10 MG tablet Take 5 mg by mouth daily as needed for allergies.    . Multiple Vitamin (MULTIVITAMIN) tablet Take 1 tablet by mouth daily.    . ondansetron (ZOFRAN) 8 MG tablet Take 1 tablet (8 mg total) by mouth every 8 (eight) hours as needed for nausea or vomiting. (Patient not taking: Reported on 09/13/2014) 20 tablet 3  . ranitidine (ZANTAC) 150 MG tablet Take 150 mg by mouth daily as needed for heartburn.    . sertraline (ZOLOFT) 100 MG tablet Take 100 mg by mouth daily.     . traMADol (ULTRAM) 50 MG tablet Take 1 tablet (50 mg total) by mouth every 6 (six) hours as needed. (Patient not taking: Reported on 09/14/2014) 30 tablet 0   No current facility-administered medications for this encounter.     ALLERGIES: Crestor   LABORATORY DATA:  Lab Results  Component Value Date   WBC 3.2* 09/14/2014   HGB 13.3 09/14/2014   HCT 39.4 09/14/2014   MCV 89.8 09/14/2014   PLT 179 09/14/2014     Lab Results  Component Value Date   NA 133* 09/14/2014   K 4.4 09/14/2014   CL 101 08/17/2014   CO2 23 09/14/2014   Lab Results  Component Value Date   ALT 15 09/14/2014   AST 19 09/14/2014   ALKPHOS 52 09/14/2014   BILITOT 0.45 09/14/2014     NARRATIVE: Glenda Stevens was seen today for weekly treatment management. The chart was checked and the patient's films were reviewed.  Seen yesterday by Dr. Pablo Ledger, has mucositis in mouth and throat, started her on MMW, has had 7/28  Rad txs anal, will see NP at 2pm today may need fluids, difficulty swallowing, poor appetite, ice cream and eggs able to get down, carnation instant breakfast this am, chicken broth  4:27 PM BP 133/113 mmHg  Pulse 94  Temp(Src) 98.5 F (36.9 C) (Oral)  Resp 20  Wt 148 lb 9.6 oz (67.405 kg)  Wt Readings from Last 3 Encounters:  09/14/14 148 lb 3.2 oz (67.223 kg)  09/14/14 148 lb 9.6 oz (67.405 kg)  09/13/14 148 lb 11.2 oz (67.45 kg)    PHYSICAL EXAMINATION: weight is 148 lb 9.6 oz (67.405 kg). Her oral temperature is 98.5 F (36.9 C). Her blood pressure is 133/113 and her pulse is 94. Her respiration is 20.  ASSESSMENT: The patient is doing satisfactorily with treatment.  PLAN: We will continue with the patient's radiation treatment as planned.

## 2014-09-14 NOTE — Telephone Encounter (Signed)
Left VM with appointment to come to see NP at [facility] at 2 pm-register on 1st floor after her RT appointment to be seen and hydrated as necessary.

## 2014-09-15 ENCOUNTER — Ambulatory Visit (HOSPITAL_BASED_OUTPATIENT_CLINIC_OR_DEPARTMENT_OTHER): Payer: Medicare Other

## 2014-09-15 ENCOUNTER — Ambulatory Visit
Admission: RE | Admit: 2014-09-15 | Discharge: 2014-09-15 | Disposition: A | Discharge status: Home / Self Care | Payer: Medicare Other | Source: Ambulatory Visit | Attending: Radiation Oncology | Admitting: Radiation Oncology

## 2014-09-15 ENCOUNTER — Ambulatory Visit: Payer: Medicare Other

## 2014-09-15 VITALS — BP 164/87 | HR 76 | Temp 98.2°F | Resp 20

## 2014-09-15 DIAGNOSIS — C20 Malignant neoplasm of rectum: Secondary | ICD-10-CM | POA: Diagnosis not present

## 2014-09-15 MED ORDER — SODIUM CHLORIDE 0.9 % IV SOLN
INTRAVENOUS | Status: DC
  Administered 2014-09-15: 14:00:00 via INTRAVENOUS

## 2014-09-15 NOTE — Patient Instructions (Signed)
Dehydration, Adult Dehydration is when you lose more fluids from the body than you take in. Vital organs like the kidneys, brain, and heart cannot function without a proper amount of fluids and salt. Any loss of fluids from the body can cause dehydration.  CAUSES   Vomiting.  Diarrhea.  Excessive sweating.  Excessive urine output.  Fever. SYMPTOMS  Mild dehydration  Thirst.  Dry lips.  Slightly dry mouth. Moderate dehydration  Very dry mouth.  Sunken eyes.  Skin does not bounce back quickly when lightly pinched and released.  Dark urine and decreased urine production.  Decreased tear production.  Headache. Severe dehydration  Very dry mouth.  Extreme thirst.  Rapid, weak pulse (more than 100 beats per minute at rest).  Cold hands and feet.  Not able to sweat in spite of heat and temperature.  Rapid breathing.  Blue lips.  Confusion and lethargy.  Difficulty being awakened.  Minimal urine production.  No tears. DIAGNOSIS  Your caregiver will diagnose dehydration based on your symptoms and your exam. Blood and urine tests will help confirm the diagnosis. The diagnostic evaluation should also identify the cause of dehydration. TREATMENT  Treatment of mild or moderate dehydration can often be done at home by increasing the amount of fluids that you drink. It is best to drink small amounts of fluid more often. Drinking too much at one time can make vomiting worse. Refer to the home care instructions below. Severe dehydration needs to be treated at the hospital where you will probably be given intravenous (IV) fluids that contain water and electrolytes. HOME CARE INSTRUCTIONS   Ask your caregiver about specific rehydration instructions.  Drink enough fluids to keep your urine clear or pale yellow.  Drink small amounts frequently if you have nausea and vomiting.  Eat as you normally do.  Avoid:  Foods or drinks high in sugar.  Carbonated  drinks.  Juice.  Extremely hot or cold fluids.  Drinks with caffeine.  Fatty, greasy foods.  Alcohol.  Tobacco.  Overeating.  Gelatin desserts.  Wash your hands well to avoid spreading bacteria and viruses.  Only take over-the-counter or prescription medicines for pain, discomfort, or fever as directed by your caregiver.  Ask your caregiver if you should continue all prescribed and over-the-counter medicines.  Keep all follow-up appointments with your caregiver. SEEK MEDICAL CARE IF:  You have abdominal pain and it increases or stays in one area (localizes).  You have a rash, stiff neck, or severe headache.  You are irritable, sleepy, or difficult to awaken.  You are weak, dizzy, or extremely thirsty. SEEK IMMEDIATE MEDICAL CARE IF:   You are unable to keep fluids down or you get worse despite treatment.  You have frequent episodes of vomiting or diarrhea.  You have blood or green matter (bile) in your vomit.  You have blood in your stool or your stool looks black and tarry.  You have not urinated in 6 to 8 hours, or you have only urinated a small amount of very dark urine.  You have a fever.  You faint. MAKE SURE YOU:   Understand these instructions.  Will watch your condition.  Will get help right away if you are not doing well or get worse. Document Released: 02/25/2005 Document Revised: 05/20/2011 Document Reviewed: 10/15/2010 ExitCare Patient Information 2015 [employer]. This information is not intended to replace advice given to you by your health care provider. Make sure you discuss any questions you have with your health care   provider.  

## 2014-09-16 ENCOUNTER — Ambulatory Visit
Admission: RE | Admit: 2014-09-16 | Discharge: 2014-09-16 | Disposition: A | Discharge status: Home / Self Care | Payer: Medicare Other | Source: Ambulatory Visit | Attending: Radiation Oncology | Admitting: Radiation Oncology

## 2014-09-16 DIAGNOSIS — C20 Malignant neoplasm of rectum: Secondary | ICD-10-CM | POA: Diagnosis not present

## 2014-09-17 ENCOUNTER — Ambulatory Visit: Payer: Medicare Other

## 2014-09-19 ENCOUNTER — Ambulatory Visit
Admission: RE | Admit: 2014-09-19 | Discharge: 2014-09-19 | Disposition: A | Discharge status: Home / Self Care | Payer: Medicare Other | Source: Ambulatory Visit | Attending: Radiation Oncology | Admitting: Radiation Oncology

## 2014-09-19 DIAGNOSIS — E860 Dehydration: Secondary | ICD-10-CM | POA: Insufficient documentation

## 2014-09-19 DIAGNOSIS — C20 Malignant neoplasm of rectum: Secondary | ICD-10-CM | POA: Diagnosis not present

## 2014-09-19 DIAGNOSIS — K1231 Oral mucositis (ulcerative) due to antineoplastic therapy: Secondary | ICD-10-CM | POA: Insufficient documentation

## 2014-09-19 NOTE — Progress Notes (Signed)
SYMPTOM MANAGEMENT CLINIC   HPI: Glenda Stevens 69 y.o. female diagnosed with rectal cancer.  Currently undergoing 5 FU/mitomycin chemo and radiation therapy.   Pt with significant mucositis; and some thrush noted.  Pt states that the mucositis is slowly improving; but she is drinking and eating minimally due to the discomfort.     HPI  ROS  Past Medical History  Diagnosis Date  . Renal artery stenosis     left stent in 2003  . Renovascular hypertension   . Hypertension   . Hyperlipidemia   . GERD (gastroesophageal reflux disease)   . Arthritis   . History of chicken pox   . Allergy     Hay fever  . Phlebitis and thrombophlebitis   . Depression   . 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 PONV (postoperative nausea and vomiting)     Past Surgical History  Procedure Laterality Date  . Transthoracic echocardiogram  06/2012    EF 55-60%, mild MR  . Nm myocar perf wall motion  09/2009    bruce myoview - normal pattern of perfusion, EF 83%, low risk scan  . Renal doppler  09/2012    left renal artery stent - 60-99% diameter reduction  . Renal artery angioplasty  07/02/2001    6x64m Genesis on Aviator balloon stent overlapping a 6x198mGenesist on Aviator balloon stent (Dr. R.Marella Chimes . Nasal sinus surgery    . Abdominal hysterectomy    . Cataract extraction Bilateral   . Breast surgery      bilateral lumpectomy-benign  . Colon resection N/A 08/18/2014    Procedure: LAPAROSCOPIC ASSISTED  COLON POLYP RESECTION , DIAGNOSTIC LAPROSCOPY ;  Surgeon: AlLeighton RuffMD;  Location: [facility];  Service: General;  Laterality: N/A;    has Renal artery stenosis; Hyperlipidemia; Essential hypertension; Nasal congestion; Smoker; Medicare annual wellness visit, initial; Advance care planning; Seborrheic keratoses; Screening for respiratory organ cancer; Diarrhea; Rectal cancer; Dehydration; and Mucositis due to chemotherapy on her problem list.    is allergic to crestor.      Medication List       This list is accurate as of: 09/14/14 11:59 PM.  Always use your most recent med list.               ALPRAZolam 0.25 MG tablet  Commonly known as:  XANAX  Take 0.125 mg by mouth daily as needed for anxiety.     atenolol 50 MG tablet  Commonly known as:  TENORMIN  Take 1 tablet (50 mg total) by mouth daily.     candesartan 32 MG tablet  Commonly known as:  ATACAND  Take 1 tablet (32 mg total) by mouth daily. <PLEASE MAKE APPOINTMENT FOR REFILLS>     magic mouthwash Soln  Take 5 mLs by mouth 3 (three) times daily as needed for mouth pain.     magic mouthwash Soln  Take 5 mLs by mouth 3 (three) times daily as needed for mouth pain. Called [facility] out patient pharmacy     multivitamin tablet  Take 1 tablet by mouth daily.     ondansetron 8 MG tablet  Commonly known as:  ZOFRAN  Take 1 tablet (8 mg total) by mouth every 8 (eight) hours as needed for nausea or vomiting.     ranitidine 150 MG tablet  Commonly known as:  ZANTAC  Take 150 mg by mouth daily as needed for heartburn.     sertraline  100 MG tablet  Commonly known as:  ZOLOFT  Take 100 mg by mouth daily.     traMADol 50 MG tablet  Commonly known as:  ULTRAM  Take 1 tablet (50 mg total) by mouth every 6 (six) hours as needed.     ZYRTEC ALLERGY 10 MG tablet  Generic drug:  cetirizine  Take 5 mg by mouth daily as needed for allergies.         PHYSICAL EXAMINATION  Oncology Vitals 09/15/2014 09/15/2014 09/14/2014 09/14/2014 09/14/2014 09/13/2014 09/05/2014  Height - - - - - - -  Weight - - - 67.223 kg 67.405 kg 67.45 kg -  Weight (lbs) - - - 148 lbs 3 oz 148 lbs 10 oz 148 lbs 11 oz -  BMI (kg/m2) - - - - - - -  Temp 98.2 98.7 - - 98.5 98.3 98.3  Pulse 76 75 92 91 94 88 65  Resp 20 20 - _0 SpO2 100 100 - 100 - - 97  BSA (m2) - - - - - - -   BP Readings from Last 3 Encounters:  09/15/14 164/87  09/14/14 159/82  09/14/14 133/113    Physical Exam  Constitutional: She is  oriented to person, place, and time and well-developed, well-nourished, and in no distress.  HENT:  Head: Normocephalic and atraumatic.  Posterior oropharnyx with erythema, white patches, and increased tenderness to entire oral mucosa.   Eyes: Conjunctivae and EOM are normal. Pupils are equal, round, and reactive to light. Right eye exhibits no discharge. Left eye exhibits no discharge. No scleral icterus.  Neck: Normal range of motion. Neck supple.  Pulmonary/Chest: Effort normal. No respiratory distress.  Musculoskeletal: Normal range of motion.  Neurological: She is alert and oriented to person, place, and time. Gait normal.  Skin: Skin is warm and dry. No rash noted. No erythema. No pallor.  Psychiatric: Affect normal.  Nursing note and vitals reviewed.   LABORATORY DATA:. Appointment on 09/14/2014  Component Date Value Ref Range Status  . WBC 09/14/2014 3.2* 3.9 - 10.3 10e3/uL Final  . NEUT# 09/14/2014 2.2  1.5 - 6.5 10e3/uL Final  . HGB 09/14/2014 13.3  11.6 - 15.9 g/dL Final  . HCT 09/14/2014 39.4  34.8 - 46.6 % Final  . Platelets 09/14/2014 179  145 - 400 10e3/uL Final  . MCV 09/14/2014 89.8  79.5 - 101.0 fL Final  . MCH 09/14/2014 30.4  25.1 - 34.0 pg Final  . MCHC 09/14/2014 33.8  31.5 - 36.0 g/dL Final  . RBC 09/14/2014 4.39  3.70 - 5.45 10e6/uL Final  . RDW 09/14/2014 12.2  11.2 - 14.5 % Final  . lymph# 09/14/2014 0.6* 0.9 - 3.3 10e3/uL Final  . MONO# 09/14/2014 0.4  0.1 - 0.9 10e3/uL Final  . Eosinophils Absolute 09/14/2014 0.1  0.0 - 0.5 10e3/uL Final  . Basophils Absolute 09/14/2014 0.0  0.0 - 0.1 10e3/uL Final  . NEUT% 09/14/2014 67.1  38.4 - 76.8 % Final  . LYMPH% 09/14/2014 17.6  14.0 - 49.7 % Final  . MONO% 09/14/2014 13.1  0.0 - 14.0 % Final  . EOS% 09/14/2014 1.8  0.0 - 7.0 % Final  . BASO% 09/14/2014 0.4  0.0 - 2.0 % Final  . Sodium 09/14/2014 133* 136 - 145 mEq/L Final  . Potassium 09/14/2014 4.4  3.5 - 5.1 mEq/L Final  . Chloride 09/14/2014 101  98 - 109  mEq/L Final  . CO2 09/14/2014 23  22 - 29  mEq/L Final  . Glucose 09/14/2014 100  70 - 140 mg/dl Final  . BUN 09/14/2014 12.1  7.0 - 26.0 mg/dL Final  . Creatinine 09/14/2014 0.7  0.6 - 1.1 mg/dL Final  . Total Bilirubin 09/14/2014 0.45  0.20 - 1.20 mg/dL Final  . Alkaline Phosphatase 09/14/2014 52  40 - 150 U/L Final  . AST 09/14/2014 19  5 - 34 U/L Final  . ALT 09/14/2014 15  0 - 55 U/L Final  . Total Protein 09/14/2014 6.8  6.4 - 8.3 g/dL Final  . Albumin 09/14/2014 3.5  3.5 - 5.0 g/dL Final  . Calcium 09/14/2014 9.7  8.4 - 10.4 mg/dL Final  . Anion Gap 09/14/2014 9  3 - 11 mEq/L Final  . EGFR 09/14/2014 87* >90 ml/min/1.73 m2 Final   eGFR is calculated using the CKD-EPI Creatinine Equation (2009)  Appointment on 09/13/2014  Component Date Value Ref Range Status  . WBC 09/13/2014 4.7  3.9 - 10.3 10e3/uL Final  . NEUT# 09/13/2014 3.8  1.5 - 6.5 10e3/uL Final  . HGB 09/13/2014 13.3  11.6 - 15.9 g/dL Final  . HCT 09/13/2014 39.5  34.8 - 46.6 % Final  . Platelets 09/13/2014 187  145 - 400 10e3/uL Final  . MCV 09/13/2014 91.0  79.5 - 101.0 fL Final  . MCH 09/13/2014 30.6  25.1 - 34.0 pg Final  . MCHC 09/13/2014 33.6  31.5 - 36.0 g/dL Final  . RBC 09/13/2014 4.34  3.70 - 5.45 10e6/uL Final  . RDW 09/13/2014 12.7  11.2 - 14.5 % Final  . lymph# 09/13/2014 0.5* 0.9 - 3.3 10e3/uL Final  . MONO# 09/13/2014 0.4  0.1 - 0.9 10e3/uL Final  . Eosinophils Absolute 09/13/2014 0.1  0.0 - 0.5 10e3/uL Final  . Basophils Absolute 09/13/2014 0.0  0.0 - 0.1 10e3/uL Final  . NEUT% 09/13/2014 79.8* 38.4 - 76.8 % Final  . LYMPH% 09/13/2014 10.3* 14.0 - 49.7 % Final  . MONO% 09/13/2014 7.9  0.0 - 14.0 % Final  . EOS% 09/13/2014 1.9  0.0 - 7.0 % Final  . BASO% 09/13/2014 0.1  0.0 - 2.0 % Final  . Sodium 09/13/2014 134* 136 - 145 mEq/L Final  . Potassium 09/13/2014 4.4  3.5 - 5.1 mEq/L Final  . Chloride 09/13/2014 102  98 - 109 mEq/L Final  . CO2 09/13/2014 23  22 - 29 mEq/L Final  . Glucose 09/13/2014  108  70 - 140 mg/dl Final  . BUN 09/13/2014 12.9  7.0 - 26.0 mg/dL Final  . Creatinine 09/13/2014 0.8  0.6 - 1.1 mg/dL Final  . Total Bilirubin 09/13/2014 0.66  0.20 - 1.20 mg/dL Final  . Alkaline Phosphatase 09/13/2014 48  40 - 150 U/L Final  . AST 09/13/2014 18  5 - 34 U/L Final  . ALT 09/13/2014 15  0 - 55 U/L Final  . Total Protein 09/13/2014 6.5  6.4 - 8.3 g/dL Final  . Albumin 09/13/2014 3.4* 3.5 - 5.0 g/dL Final  . Calcium 09/13/2014 9.5  8.4 - 10.4 mg/dL Final  . Anion Gap 09/13/2014 9  3 - 11 mEq/L Final  . EGFR 09/13/2014 79* >90 ml/min/1.73 m2 Final   eGFR is calculated using the CKD-EPI Creatinine Equation (2009)     RADIOGRAPHIC STUDIES: No results found.  ASSESSMENT/PLAN:    Dehydration Pt with significant mucositis; and some thrush noted.  Pt states that the mucositis is slowly improving; but she is drinking and eating minimally due to the discomfort.  On exam- severe mucositis; and trace thrush to posterior orophayrnx.   Pt prescribed magic mouthwash with nystatin. Also encouraged to continue with baking soda/salt swish and spit.   Pt appears only mildly dehydrated today. Pt prefers to return to the cancer center tomorrow for IV fluid rehydration.  She states that she is tired today and wants to go home.   Pt will return tomorrow for IV fluids. She was also encouraged to push fluids at home.   Mucositis due to chemotherapy Pt with significant mucositis; and some thrush noted.  Pt states that the mucositis is slowly improving; but she is drinking and eating minimally due to the discomfort.    On exam- severe mucositis; and trace thrush to posterior orophayrnx.   Pt prescribed magic mouthwash with nystatin. Also encouraged to continue with baking soda/salt swish and spit.   Pt appears only mildly dehydrated today. Pt prefers to return to the cancer center tomorrow for IV fluid rehydration.  She states that she is tired today and wants to go home.   Pt will  return tomorrow for IV fluids. She was also encouraged to push fluids at home.   Rectal cancer Last received 5 FU/mitomycin chemotherapy on 09/05/14 thru 09/09/14.   Pt continues to receive radiation therapy; and will complete RT on 10/14/14.   Pt scheduled to return 09/20/14 for labs and visit.   Patient stated understanding of all instructions; and was in agreement with this plan of care. The patient knows to call the clinic with any problems, questions or concerns.   Review/collaboration with Dr. Burr Medico regarding all aspects of patient's visit today.   Total time spent with patient was 25 minutes;  with greater than 75 percent of that time spent in face to face counseling regarding patient's symptoms,  and coordination of care and follow up.  Disclaimer: This note was dictated with voice recognition software. Similar sounding words can inadvertently be transcribed and may not be corrected upon review.   Drue Second, NP 09/19/2014

## 2014-09-19 NOTE — Assessment & Plan Note (Signed)
Last received 5 FU/mitomycin chemotherapy on 09/05/14 thru 09/09/14.   Pt continues to receive radiation therapy; and will complete RT on 10/14/14.   Pt scheduled to return 09/20/14 for labs and visit.

## 2014-09-19 NOTE — Assessment & Plan Note (Signed)
Pt with significant mucositis; and some thrush noted.  Pt states that the mucositis is slowly improving; but she is drinking and eating minimally due to the discomfort.    On exam- severe mucositis; and trace thrush to posterior orophayrnx.   Pt prescribed magic mouthwash with nystatin. Also encouraged to continue with baking soda/salt swish and spit.   Pt appears only mildly dehydrated today. Pt prefers to return to the cancer center tomorrow for IV fluid rehydration.  She states that she is tired today and wants to go home.   Pt will return tomorrow for IV fluids. She was also encouraged to push fluids at home.

## 2014-09-20 ENCOUNTER — Telehealth: Payer: Self-pay | Admitting: Hematology

## 2014-09-20 ENCOUNTER — Ambulatory Visit
Admission: RE | Admit: 2014-09-20 | Discharge: 2014-09-20 | Disposition: A | Discharge status: Home / Self Care | Payer: Medicare Other | Source: Ambulatory Visit | Attending: Radiation Oncology | Admitting: Radiation Oncology

## 2014-09-20 ENCOUNTER — Encounter: Payer: Self-pay | Admitting: *Deleted

## 2014-09-20 ENCOUNTER — Ambulatory Visit: Payer: Medicare Other

## 2014-09-20 ENCOUNTER — Ambulatory Visit (HOSPITAL_BASED_OUTPATIENT_CLINIC_OR_DEPARTMENT_OTHER): Payer: Medicare Other | Admitting: Nurse Practitioner

## 2014-09-20 ENCOUNTER — Other Ambulatory Visit (HOSPITAL_BASED_OUTPATIENT_CLINIC_OR_DEPARTMENT_OTHER): Payer: Medicare Other

## 2014-09-20 VITALS — BP 133/60 | HR 83 | Temp 98.2°F | Resp 20 | Ht 62.0 in | Wt 148.2 lb

## 2014-09-20 DIAGNOSIS — B029 Zoster without complications: Secondary | ICD-10-CM | POA: Diagnosis not present

## 2014-09-20 DIAGNOSIS — C20 Malignant neoplasm of rectum: Secondary | ICD-10-CM | POA: Diagnosis not present

## 2014-09-20 DIAGNOSIS — K1231 Oral mucositis (ulcerative) due to antineoplastic therapy: Secondary | ICD-10-CM | POA: Diagnosis not present

## 2014-09-20 LAB — COMPREHENSIVE METABOLIC PANEL (CC13)
ALK PHOS: 48 U/L (ref 40–150)
ALT: 17 U/L (ref 0–55)
AST: 20 U/L (ref 5–34)
Albumin: 3.4 g/dL — ABNORMAL LOW (ref 3.5–5.0)
Anion Gap: 8 mEq/L (ref 3–11)
BUN: 7.7 mg/dL (ref 7.0–26.0)
CALCIUM: 9.4 mg/dL (ref 8.4–10.4)
CHLORIDE: 102 meq/L (ref 98–109)
CO2: 26 meq/L (ref 22–29)
CREATININE: 0.8 mg/dL (ref 0.6–1.1)
EGFR: 80 mL/min/{1.73_m2} — ABNORMAL LOW (ref >90)
Glucose: 103 mg/dl (ref 70–140)
Potassium: 4.1 mEq/L (ref 3.5–5.1)
Sodium: 136 mEq/L (ref 136–145)
TOTAL PROTEIN: 6.4 g/dL (ref 6.4–8.3)
Total Bilirubin: 0.2 mg/dL (ref 0.20–1.20)

## 2014-09-20 LAB — CBC WITH DIFFERENTIAL/PLATELET
BASO%: 0.3 % (ref 0.0–2.0)
Basophils Absolute: 0 10*3/uL (ref 0.0–0.1)
EOS ABS: 0.1 10*3/uL (ref 0.0–0.5)
EOS%: 2.5 % (ref 0.0–7.0)
HCT: 39.2 % (ref 34.8–46.6)
HGB: 13.3 g/dL (ref 11.6–15.9)
LYMPH%: 20.6 % (ref 14.0–49.7)
MCH: 30.1 pg (ref 25.1–34.0)
MCHC: 33.8 g/dL (ref 31.5–36.0)
MCV: 89.2 fL (ref 79.5–101.0)
MONO#: 0.5 10*3/uL (ref 0.1–0.9)
MONO%: 20.1 % — ABNORMAL HIGH (ref 0.0–14.0)
NEUT%: 56.5 % (ref 38.4–76.8)
NEUTROS ABS: 1.3 10*3/uL — AB (ref 1.5–6.5)
Platelets: 127 10*3/uL — ABNORMAL LOW (ref 145–400)
RBC: 4.4 10*6/uL (ref 3.70–5.45)
RDW: 12.6 % (ref 11.2–14.5)
WBC: 2.4 10*3/uL — ABNORMAL LOW (ref 3.9–10.3)
lymph#: 0.5 10*3/uL — ABNORMAL LOW (ref 0.9–3.3)

## 2014-09-20 MED ORDER — FLUCONAZOLE 150 MG PO TABS: 150.0000 mg | ORAL_TABLET | Freq: Once | ORAL | Status: DC

## 2014-09-20 MED ORDER — VALACYCLOVIR HCL 1 G PO TABS: 1000.0000 mg | ORAL_TABLET | Freq: Three times a day (TID) | ORAL | Status: DC

## 2014-09-20 NOTE — Telephone Encounter (Signed)
lvm for pt regarding to 7.15 appt cx and moved to 7.19

## 2014-09-20 NOTE — Progress Notes (Signed)
  [place] OFFICE PROGRESS NOTE   Diagnosis:  Rectal squamous cell carcinoma Oncology History   Presented with heme + stool, loose stools for 2 months     Rectal cancer   08/01/2014 Pathology Results Rectum, biopsy, distal mass - SQUAMOUS CELL CARCINOMA, BASALOID TYPE   08/01/2014 Procedure Colonoscopy-ulcerated, malignant appearing distal rectal mass adjacent to the internal anal sphincter. #14 polyps spread throughout the colon (#12 removed)   08/01/2014 Tumor Marker CEA=5.3   08/01/2014 Initial Diagnosis Rectal cancer   08/04/2014 Imaging CT C/A/P-No evidence of metastatic disease in chest, abdomen, or pelvis   08/18/2014 Procedure Colonoscopy and parotidectomy. Path reviewed tubular adenoma.   08/25/2014 Imaging PET IMPRESSION: 1. Hypermetabolic activity at the anus corresponding with known squamous cell carcinoma. No evidence of metastatic disease. 2. No other suspicious metabolic activity. 3. Diffuse atherosclerosis.        INTERVAL HISTORY:   Glenda Stevens returns as scheduled. She began radiation 09/05/2014. She completed cycle 1 5-FU/mitomycin C beginning 09/05/2014. She developed mouth sores last week. She was seen in the symptom management clinic. She received IV fluids. At the time she had mouth sores oral intake was difficult. The ulcers are now healing. She reports good fluid intake at present. Appetite is poor. No nausea or vomiting following the chemotherapy. She continues to have loose stools one to 2 times a day. No rectal bleeding. She feels that she has a vaginal yeast infection. She reports a history of multiple vaginal yeast infections.  Objective:  Vital signs in last 24 hours:  Blood pressure 133/60, pulse 83, temperature 98.2 F (36.8 C), temperature source Oral, resp. rate 20, height 5\' 2"  (1.575 m), weight 148 lb 3.2 oz (67.223 kg), SpO2 99 %.    HEENT: Healing ulcerations bilateral posterior buccal regions and left  posterolateral tongue. Resp: Lungs clear bilaterally. Cardio: Regular rate and rhythm. GI: Abdomen soft and nontender. No hepatomegaly. Vascular: No leg edema. Calves soft and nontender. Skin: 3 vesicular appearing skin lesions in a dermatomal distribution right thoracic region. Perianal skin is without erythema or ulceration.    Lab Results:  Lab Results  Component Value Date   WBC 2.4* 09/20/2014   HGB 13.3 09/20/2014   HCT 39.2 09/20/2014   MCV 89.2 09/20/2014   PLT 127* 09/20/2014   NEUTROABS 1.3* 09/20/2014    Imaging:  No results found.  Medications: I have reviewed the patient's current medications.  Assessment/Plan: 1. Rectal squamous cell carcinoma   Initiation of radiation 09/05/2014.  Initiation of cycle 1 5-FU/mitomycin-C 09/05/2014. 2. Mucositis following cycle 1 5-FU/mitomycin C 3. Herpes zoster right thoracic dermatome 09/20/2014.   Disposition: Glenda Stevens appears stable. She completed cycle 1 5-FU/mitomycin C beginning 09/05/2014. She continues radiation.  She developed significant mucositis following cycle 1 5-FU/mitomycin-C. The mouth ulcers are healing.  She appears to have herpes zoster in a right thoracic dermatome. She will complete a 7 day course of Valtrex.  She has symptoms of a vaginal yeast infection. She reports multiple yeast infections in the past. She will take Diflucan 150 mg 1 with dose to be repeated in 7 days if the symptoms persist.  She will return for a follow-up visit 09/27/2014. She will contact the office in the interim with any problems.  Plan reviewed with Dr. Burr Medico.  Ned Card ANP/GNP-BC   09/20/2014  1:54 PM

## 2014-09-20 NOTE — Progress Notes (Signed)
Patient came by nursing believes she has a yeast infection vaginally, bought monitstat OTC, and has helped itching stated, and will need  A refill on MMW for her mucositis, asked if she wanted to be seen by on call MD or can wait till tomorrow to see MD Dr. Lisbeth Renshaw, 'she stated"she would rather wait and see Dr. Lisbeth Renshaw tomorrow and wants him to check her to see if she indeed has a yeast infection "

## 2014-09-21 ENCOUNTER — Ambulatory Visit: Payer: Medicare Other

## 2014-09-21 ENCOUNTER — Ambulatory Visit
Admission: RE | Admit: 2014-09-21 | Discharge: 2014-09-21 | Disposition: A | Discharge status: Home / Self Care | Payer: Medicare Other | Source: Ambulatory Visit | Attending: Radiation Oncology | Admitting: Radiation Oncology

## 2014-09-21 ENCOUNTER — Encounter: Payer: Self-pay | Admitting: Radiation Oncology

## 2014-09-21 VITALS — BP 147/54 | HR 89 | Temp 97.9°F | Resp 20 | Ht 62.0 in | Wt 148.6 lb

## 2014-09-21 DIAGNOSIS — C20 Malignant neoplasm of rectum: Secondary | ICD-10-CM | POA: Diagnosis not present

## 2014-09-21 MED ORDER — MAGIC MOUTHWASH: 5.0000 mL | Freq: Three times a day (TID) | ORAL | Status: DC | PRN

## 2014-09-21 NOTE — Progress Notes (Addendum)
Weekly rad txs, anal 11/28 completed,  Still having diarrhea,taking imodium,  No skin breakdown in area treated, MMW for mucosits in mouth, mild pain there, most pain in [place] with yeast infction started Diflucan today, also looks like shingles outbreak on right mid side back  Vesicles 3-4 seen started on Valtrex today, appetite better, still tired but improving. No nausea 1:39 PM BP 147/54 mmHg  Pulse 89  Temp(Src) 97.9 F (36.6 C) (Oral)  Resp 20  Ht 5\' 2"  (1.575 m)  Wt 148 lb 9.6 oz (67.405 kg)  BMI 27.17 kg/m2  Wt Readings from Last 3 Encounters:  09/21/14 148 lb 9.6 oz (67.405 kg)  09/20/14 148 lb 3.2 oz (67.223 kg)  09/20/14 148 lb 11.2 oz (67.45 kg)

## 2014-09-21 NOTE — Progress Notes (Signed)
Department of Radiation Oncology  Phone:  [phone] Fax:        [phone]  Weekly Treatment Note    Name: Glenda Stevens Date: 09/21/2014 MRN: [id number] DOB: 24-Jan-1946   Current dose: 19.8 Gy  Current fraction: 11   MEDICATIONS: Current Outpatient Prescriptions  Medication Sig Dispense Refill  . miconazole (MICOTIN) 2 % cream Apply 1 application topically as needed.    . nystatin (MYCOSTATIN) 100000 UNIT/ML suspension Take 1,000,000 Units by mouth.    . ALPRAZolam (XANAX) 0.25 MG tablet Take 0.125 mg by mouth daily as needed for anxiety.    . Alum & Mag Hydroxide-Simeth (MAGIC MOUTHWASH) SOLN Take 5 mLs by mouth 3 (three) times daily as needed for mouth pain. Called [place] out patient pharmacy 300 mL 0  . atenolol (TENORMIN) 50 MG tablet Take 1 tablet (50 mg total) by mouth daily. (Patient taking differently: Take 50 mg by mouth daily after supper. ) 90 tablet 3  . candesartan (ATACAND) 32 MG tablet Take 1 tablet (32 mg total) by mouth daily. <PLEASE MAKE APPOINTMENT FOR REFILLS> 90 tablet 0  . cetirizine (ZYRTEC ALLERGY) 10 MG tablet Take 5 mg by mouth daily as needed for allergies.    . fluconazole (DIFLUCAN) 150 MG tablet Take 1 tablet (150 mg total) by mouth once. Repeat dose in 7 days if symptoms are still present. 2 tablet 0  . Multiple Vitamin (MULTIVITAMIN) tablet Take 1 tablet by mouth daily.    . ondansetron (ZOFRAN) 8 MG tablet Take 1 tablet (8 mg total) by mouth every 8 (eight) hours as needed for nausea or vomiting. 20 tablet 3  . ranitidine (ZANTAC) 150 MG tablet Take 150 mg by mouth daily as needed for heartburn.    . sertraline (ZOLOFT) 100 MG tablet Take 100 mg by mouth daily.     . traMADol (ULTRAM) 50 MG tablet Take 1 tablet (50 mg total) by mouth every 6 (six) hours as needed. 30 tablet 0  . valACYclovir (VALTREX) 1000 MG tablet Take 1 tablet (1,000 mg total) by mouth 3 (three) times daily. 21 tablet 0   No current facility-administered medications  for this encounter.     ALLERGIES: Crestor   LABORATORY DATA:  Lab Results  Component Value Date   WBC 2.4* 09/20/2014   HGB 13.3 09/20/2014   HCT 39.2 09/20/2014   MCV 89.2 09/20/2014   PLT 127* 09/20/2014   Lab Results  Component Value Date   NA 136 09/20/2014   K 4.1 09/20/2014   CL 101 08/17/2014   CO2 26 09/20/2014   Lab Results  Component Value Date   ALT 17 09/20/2014   AST 20 09/20/2014   ALKPHOS 48 09/20/2014   BILITOT 0.20 09/20/2014     NARRATIVE: Glenda Stevens was seen today for weekly treatment management. The chart was checked and the patient's films were reviewed.  Weekly rad txs, anal 11/28 completed,  Still having diarrhea,taking imodium,  No skin breakdown in area treated, MMW for mucosits in mouth, mild pain there, most pain in [place] with yeast infction started Diflucan today, also looks like shingles outbreak on right mid side back  Vesicles 3-4 seen started on Valtrex today, appetite better, still tired but improving. No nausea 5:44 PM BP 147/54 mmHg  Pulse 89  Temp(Src) 97.9 F (36.6 C) (Oral)  Resp 20  Ht 5\' 2"  (1.575 m)  Wt 148 lb 9.6 oz (67.405 kg)  BMI 27.17 kg/m2  Wt Readings from Last 3 Encounters:  09/21/14 148 lb 9.6 oz (67.405 kg)  09/20/14 148 lb 3.2 oz (67.223 kg)  09/20/14 148 lb 11.2 oz (67.45 kg)    PHYSICAL EXAMINATION: height is 5\' 2"  (1.575 m) and weight is 148 lb 9.6 oz (67.405 kg). Her oral temperature is 97.9 F (36.6 C). Her blood pressure is 147/54 and her pulse is 89. Her respiration is 20.        ASSESSMENT: The patient is doing satisfactorily with treatment.  PLAN: We will continue with the patient's radiation treatment as planned.

## 2014-09-22 ENCOUNTER — Ambulatory Visit: Payer: Medicare Other

## 2014-09-22 ENCOUNTER — Ambulatory Visit
Admission: RE | Admit: 2014-09-22 | Discharge: 2014-09-22 | Disposition: A | Discharge status: Home / Self Care | Payer: Medicare Other | Source: Ambulatory Visit | Attending: Radiation Oncology | Admitting: Radiation Oncology

## 2014-09-22 DIAGNOSIS — C20 Malignant neoplasm of rectum: Secondary | ICD-10-CM | POA: Diagnosis not present

## 2014-09-23 ENCOUNTER — Ambulatory Visit
Admission: RE | Admit: 2014-09-23 | Discharge: 2014-09-23 | Disposition: A | Discharge status: Home / Self Care | Payer: Medicare Other | Source: Ambulatory Visit | Attending: Radiation Oncology | Admitting: Radiation Oncology

## 2014-09-23 ENCOUNTER — Ambulatory Visit: Payer: Medicare Other | Admitting: Hematology

## 2014-09-23 ENCOUNTER — Ambulatory Visit: Payer: Medicare Other | Admitting: Radiation Oncology

## 2014-09-23 ENCOUNTER — Ambulatory Visit: Payer: Medicare Other

## 2014-09-23 DIAGNOSIS — C20 Malignant neoplasm of rectum: Secondary | ICD-10-CM | POA: Diagnosis not present

## 2014-09-24 ENCOUNTER — Ambulatory Visit: Payer: Medicare Other

## 2014-09-24 NOTE — Progress Notes (Signed)
  Radiation Oncology         [phone] ________________________________  Name: Glenda Stevens MRN: [id number]  Date: 08/12/2014  DOB: [birth year]  SIMULATION AND TREATMENT PLANNING NOTE   CONSENT VERIFIED: yes   SET UP: Patient is set-up supine   IMMOBILIZATION: The following immobilization is used: Customized VAC lock bag. This complex treatment device will be used on a daily basis during the patient's treatment.   Diagnosis: Anal cancer   NARRATIVE: The patient was brought to the [facility]. Identity was confirmed. All relevant records and images related to the planned course of therapy were reviewed. Then, the patient was positioned in a stable reproducible clinical set-up for radiation therapy using an aquaplast mask. Skin markings were placed. The CT images were loaded into the planning software where the target and avoidance structures were contoured.The radiation prescription was entered and confirmed.   The patient will receive 50.4 Gy in 28 fractions to the high-dose target region.  Daily image guidance is ordered, and this will be used on a daily basis. This is necessary to ensure accurate and precise localization of the target in addition to accurate alignment of the normal tissue structures in this region. This is particularly important given the possible motion of the high-dose target.  Treatment planning then occurred.   I have requested : Intensity Modulated Radiotherapy (IMRT) is medically necessary for this case for the following reason: Dose homogeneity; the target is in close proximity to critical normal structures, including the femoral heads, bladder, and small bowel. IMRT is thus medically to appropriately treat the patient.   Special treatment procedure  The patient will receive chemotherapy during the course of radiation treatment. The patient may experience increased or overlapping toxicity due to this combined-modality approach and the patient will  be monitored for such problems. This may include extra lab work as necessary. This therefore constitutes a special treatment procedure.     ________________________________  Jodelle Gross, MD, PhD

## 2014-09-24 NOTE — Addendum Note (Signed)
Encounter addended by: Kyung Rudd, MD on: 09/24/2014 11:48 AM<BR>     Documentation filed: Notes Section, Visit Diagnoses

## 2014-09-26 ENCOUNTER — Ambulatory Visit
Admission: RE | Admit: 2014-09-26 | Discharge: 2014-09-26 | Disposition: A | Discharge status: Home / Self Care | Payer: Medicare Other | Source: Ambulatory Visit | Attending: Radiation Oncology | Admitting: Radiation Oncology

## 2014-09-26 DIAGNOSIS — C20 Malignant neoplasm of rectum: Secondary | ICD-10-CM | POA: Diagnosis not present

## 2014-09-27 ENCOUNTER — Other Ambulatory Visit: Payer: Medicare Other

## 2014-09-27 ENCOUNTER — Telehealth: Payer: Self-pay | Admitting: *Deleted

## 2014-09-27 ENCOUNTER — Ambulatory Visit (HOSPITAL_BASED_OUTPATIENT_CLINIC_OR_DEPARTMENT_OTHER): Payer: Medicare Other | Admitting: Hematology

## 2014-09-27 ENCOUNTER — Ambulatory Visit
Admission: RE | Admit: 2014-09-27 | Discharge: 2014-09-27 | Disposition: A | Discharge status: Home / Self Care | Payer: Medicare Other | Source: Ambulatory Visit | Attending: Radiation Oncology | Admitting: Radiation Oncology

## 2014-09-27 ENCOUNTER — Other Ambulatory Visit (HOSPITAL_BASED_OUTPATIENT_CLINIC_OR_DEPARTMENT_OTHER): Payer: Medicare Other

## 2014-09-27 ENCOUNTER — Encounter: Payer: Self-pay | Admitting: *Deleted

## 2014-09-27 ENCOUNTER — Encounter: Payer: Self-pay | Admitting: Hematology

## 2014-09-27 VITALS — BP 148/76 | HR 97 | Temp 98.7°F | Resp 18 | Ht 62.0 in | Wt 149.3 lb

## 2014-09-27 DIAGNOSIS — C20 Malignant neoplasm of rectum: Secondary | ICD-10-CM

## 2014-09-27 DIAGNOSIS — Z720 Tobacco use: Secondary | ICD-10-CM | POA: Diagnosis not present

## 2014-09-27 LAB — COMPREHENSIVE METABOLIC PANEL (CC13)
ALK PHOS: 48 U/L (ref 40–150)
ALT: 20 U/L (ref 0–55)
ANION GAP: 6 meq/L (ref 3–11)
AST: 23 U/L (ref 5–34)
Albumin: 3.4 g/dL — ABNORMAL LOW (ref 3.5–5.0)
BUN: 7.3 mg/dL (ref 7.0–26.0)
CALCIUM: 9.4 mg/dL (ref 8.4–10.4)
CHLORIDE: 103 meq/L (ref 98–109)
CO2: 26 mEq/L (ref 22–29)
CREATININE: 0.7 mg/dL (ref 0.6–1.1)
EGFR: 88 mL/min/{1.73_m2} — ABNORMAL LOW (ref >90)
Glucose: 95 mg/dl (ref 70–140)
POTASSIUM: 4.2 meq/L (ref 3.5–5.1)
Sodium: 135 mEq/L — ABNORMAL LOW (ref 136–145)
Total Bilirubin: <0.2 mg/dL (ref 0.20–1.20)
Total Protein: 6.3 g/dL — ABNORMAL LOW (ref 6.4–8.3)

## 2014-09-27 LAB — CBC WITH DIFFERENTIAL/PLATELET
BASO%: 0.4 % (ref 0.0–2.0)
Basophils Absolute: 0 10*3/uL (ref 0.0–0.1)
EOS ABS: 0 10*3/uL (ref 0.0–0.5)
EOS%: 0.9 % (ref 0.0–7.0)
HCT: 40.5 % (ref 34.8–46.6)
HGB: 13.5 g/dL (ref 11.6–15.9)
LYMPH#: 0.7 10*3/uL — AB (ref 0.9–3.3)
LYMPH%: 14.6 % (ref 14.0–49.7)
MCH: 30.1 pg (ref 25.1–34.0)
MCHC: 33.4 g/dL (ref 31.5–36.0)
MCV: 90 fL (ref 79.5–101.0)
MONO#: 0.6 10*3/uL (ref 0.1–0.9)
MONO%: 11.2 % (ref 0.0–14.0)
NEUT#: 3.6 10*3/uL (ref 1.5–6.5)
NEUT%: 72.9 % (ref 38.4–76.8)
Platelets: 141 10*3/uL — ABNORMAL LOW (ref 145–400)
RBC: 4.5 10*6/uL (ref 3.70–5.45)
RDW: 13.1 % (ref 11.2–14.5)
WBC: 5 10*3/uL (ref 3.9–10.3)

## 2014-09-27 NOTE — Progress Notes (Signed)
[facility]  [phone] Fax:([phone]) [facility] Note   Patient Care Team: Tonia Ghent, MD as PCP - General (Family Medicine) 09/27/2014  CHIEF COMPLAINTS:  Follow up rectal squamous cell carcinoma  Oncology History   Presented with heme + stool, loose stools for 2 months     Rectal cancer   08/01/2014 Pathology Results Rectum, biopsy, distal mass - SQUAMOUS CELL CARCINOMA, BASALOID TYPE   08/01/2014 Procedure Colonoscopy-ulcerated, malignant appearing distal rectal mass adjacent to the internal anal sphincter. #14 polyps spread throughout the colon (#12 removed)   08/01/2014 Tumor Marker CEA=5.3   08/01/2014 Initial Diagnosis Rectal cancer   08/04/2014 Imaging CT C/A/P-No evidence of metastatic disease in chest, abdomen, or pelvis   08/18/2014 Procedure Colonoscopy and parotidectomy. Path reviewed tubular adenoma.   08/25/2014 Imaging PET IMPRESSION: 1. Hypermetabolic activity at the anus corresponding with known squamous cell carcinoma. No evidence of metastatic disease. 2. No other suspicious metabolic activity. 3. Diffuse atherosclerosis.    HISTORY OF PRESENTING ILLNESS:  Glenda Stevens 69 y.o. female is here because of recently diagnosed rectal squamous cell carcinoma.  She has been having diarrhea for the past 4 months, she has watery bowel movement 1-2 times daily, with some urgency, no blood, associated with abdominal cramps before the bowel movement. She denies any nausea or vomiting. She has good appetite, no weight loss. No fever or chills. She didn't seek medical attention until a month's ago she was in [place] by her primary care physician. Lab CBC and CMP was unremarkable, stool guaiac was positive. She was referred to gastroenterologist Dr. Ardis Hughs, and underwent her first colonoscopy on 08/01/2014. The colonoscopy showed a total of 14 polyps, 12 polyps were removed, and a distal rectal mass adjacent to the internal and no sphincter was  found, which was suspicious for malignancy. Biopsy showed squamous cell carcinoma. Staging scan CT showed no evidence of adenopathy or distant metastasis. She was referred to our cancer center to discuss chemotherapy and radiation. She is going to see radiation oncologist Dr. Lisbeth Renshaw this afternoon.  INTERIM HISTORY [name] returns for follow-up. She is on 4th week of concurrent chemoradiation. She  had a severe mucositis on week 2, which gradually improved by taking Magic mouthwash and over time. She is doing well clinically., Mild fatigue, able to tolerate routine activities.she has mild diarrhea 1-2 times a day, she takes Imodium sometimes. Mild rectal discomfort, no significant other pain or complaints. Her weight has been stable.  MEDICAL HISTORY:  Past Medical History  Diagnosis Date  . Renal artery stenosis     left stent in 2003  . Renovascular hypertension   . Hypertension   . Hyperlipidemia   . GERD (gastroesophageal reflux disease)   . Arthritis   . History of chicken pox   . Allergy     Hay fever  . Phlebitis and thrombophlebitis   . Depression   . 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 PONV (postoperative nausea and vomiting)     SURGICAL HISTORY: Past Surgical History  Procedure Laterality Date  . Transthoracic echocardiogram  06/2012    EF 55-60%, mild MR  . Nm myocar perf wall motion  09/2009    bruce myoview - normal pattern of perfusion, EF 83%, low risk scan  . Renal doppler  09/2012    left renal artery stent - 60-99% diameter reduction  . Renal artery angioplasty  07/02/2001    6x43mm Genesis on  Aviator balloon stent overlapping a 6x68mm Genesist on Aviator balloon stent (Dr. Marella Chimes)  . Nasal sinus surgery    . Abdominal hysterectomy    . Cataract extraction Bilateral   . Breast surgery      bilateral lumpectomy-benign  . Colon resection N/A 08/18/2014    Procedure: LAPAROSCOPIC ASSISTED  COLON POLYP RESECTION , DIAGNOSTIC LAPROSCOPY ;   Surgeon: Leighton Ruff, MD;  Location: [facility];  Service: General;  Laterality: N/A;    SOCIAL HISTORY: History   Social History  . Marital Status: Divorced    Spouse Name: N/A  . Number of Children: 1  . Years of Education: 16   Occupational History  . Education officer, museum    Social History Main Topics  . Smoking status: Current Every Day Smoker -- 1.00 packs/day for 30 years    Types: Cigarettes  . Smokeless tobacco: Never Used  . Alcohol Use: 4.8 - 5.4 oz/week    1-2 Glasses of wine, 7 Standard drinks or equivalent per week     Comment: daily -glass wine  . Drug Use: No  . Sexual Activity: Not on file   Other Topics Concern  . Not on file   Social History Narrative   Divorced   1 son, local who lives with her   Retired from [employer] work   Hydrologist grad   Has poodle named "Murphy"    FAMILY HISTORY: Family History  Problem Relation Age of Onset  . Hypertension Mother   . Heart disease Mother   . Dementia Mother   . Heart disease Father   . Hyperlipidemia Father   . Cancer Father     lung cancer and prostate cancer   . Colon cancer Neg Hx   . Breast cancer Cousin   . Cancer Cousin     4 paternal and 1 maternal cousins had breast cancer     ALLERGIES:  is allergic to crestor.  MEDICATIONS:  Current Outpatient Prescriptions  Medication Sig Dispense Refill  . ALPRAZolam (XANAX) 0.25 MG tablet Take 0.125 mg by mouth daily as needed for anxiety.    . Alum & Mag Hydroxide-Simeth (MAGIC MOUTHWASH) SOLN Take 5 mLs by mouth 3 (three) times daily as needed for mouth pain. Called [place] out patient pharmacy (Patient taking differently: Take 5 mLs by mouth 3 (three) times daily as needed for mouth pain (refilled 09/13/14). Called [facility] out patient pharmacy) 300 mL 0  . atenolol (TENORMIN) 50 MG tablet Take 1 tablet (50 mg total) by mouth daily. (Patient taking differently: Take 50 mg by mouth daily after supper. ) 90 tablet 3  . candesartan (ATACAND) 32 MG tablet Take  1 tablet (32 mg total) by mouth daily. <PLEASE MAKE APPOINTMENT FOR REFILLS> 90 tablet 0  . cetirizine (ZYRTEC ALLERGY) 10 MG tablet Take 5 mg by mouth daily as needed for allergies.    . fluconazole (DIFLUCAN) 150 MG tablet Take 1 tablet (150 mg total) by mouth once. Repeat dose in 7 days if symptoms are still present. 2 tablet 0  . miconazole (MICOTIN) 2 % cream Apply 1 application topically as needed.    . Multiple Vitamin (MULTIVITAMIN) tablet Take 1 tablet by mouth daily.    Marland Kitchen nystatin (MYCOSTATIN) 100000 UNIT/ML suspension Take 1,000,000 Units by mouth.    . ondansetron (ZOFRAN) 8 MG tablet Take 1 tablet (8 mg total) by mouth every 8 (eight) hours as needed for nausea or vomiting. 20 tablet 3  . ranitidine (ZANTAC)  150 MG tablet Take 150 mg by mouth daily as needed for heartburn.    . sertraline (ZOLOFT) 100 MG tablet Take 100 mg by mouth daily.     . traMADol (ULTRAM) 50 MG tablet Take 1 tablet (50 mg total) by mouth every 6 (six) hours as needed. 30 tablet 0  . valACYclovir (VALTREX) 1000 MG tablet Take 1 tablet (1,000 mg total) by mouth 3 (three) times daily. 21 tablet 0   No current facility-administered medications for this visit.    REVIEW OF SYSTEMS:   Constitutional: Denies fevers, chills or abnormal night sweats Eyes: Denies blurriness of vision, double vision or watery eyes Ears, nose, mouth, throat, and face: Denies mucositis or sore throat Respiratory: Denies cough, dyspnea or wheezes Cardiovascular: Denies palpitation, chest discomfort or lower extremity swelling Gastrointestinal:  Denies nausea, heartburn or change in bowel habits Skin: Denies abnormal skin rashes Lymphatics: Denies new lymphadenopathy or easy bruising Neurological:Denies numbness, tingling or new weaknesses Behavioral/Psych: Mood is stable, no new changes  All other systems were reviewed with the patient and are negative.  PHYSICAL EXAMINATION: ECOG PERFORMANCE STATUS: 1 - Symptomatic but completely  ambulatory  Filed Vitals:   09/27/14 1414  BP: 148/76  Pulse: 97  Temp: 98.7 F (37.1 C)  Resp: 18   Filed Weights   09/27/14 1414  Weight: 149 lb 4.8 oz (67.722 kg)    GENERAL:alert, no distress and comfortable SKIN: skin color, texture, turgor are normal, no rashes or significant lesions EYES: normal, conjunctiva are pink and non-injected, sclera clear OROPHARYNX:no exudate, no erythema and lips, buccal mucosa, and tongue normal  NECK: supple, thyroid normal size, non-tender, without nodularity LYMPH:  no palpable lymphadenopathy in the cervical, axillary or bilaterla inguinal LUNGS: clear to auscultation and percussion with normal breathing effort HEART: regular rate & rhythm and no murmurs and no lower extremity edema ABDOMEN:abdomen soft, non-tender and normal bowel sounds. Small area of mild skin erythema at perianlal area, no ulcers.  Musculoskeletal:no cyanosis of digits and no clubbing  PSYCH: alert & oriented x 3 with fluent speech NEURO: no focal motor/sensory deficits  LABORATORY DATA:  CBC Latest Ref Rng 09/27/2014 09/20/2014 09/14/2014  WBC 3.9 - 10.3 10e3/uL 5.0 2.4(L) 3.2(L)  Hemoglobin 11.6 - 15.9 g/dL 13.5 13.3 13.3  Hematocrit 34.8 - 46.6 % 40.5 39.2 39.4  Platelets 145 - 400 10e3/uL 141(L) 127(L) 179    CMP Latest Ref Rng 09/27/2014 09/20/2014 09/14/2014  Glucose 70 - 140 mg/dl 95 103 100  BUN 7.0 - 26.0 mg/dL 7.3 7.7 12.1  Creatinine 0.6 - 1.1 mg/dL 0.7 0.8 0.7  Sodium 136 - 145 mEq/L 135(L) 136 133(L)  Potassium 3.5 - 5.1 mEq/L 4.2 4.1 4.4  Chloride 101 - 111 mmol/L - - -  CO2 22 - 29 mEq/L 26 26 23   Calcium 8.4 - 10.4 mg/dL 9.4 9.4 9.7  Total Protein 6.4 - 8.3 g/dL 6.3(L) 6.4 6.8  Total Bilirubin 0.20 - 1.20 mg/dL <0.20 0.20 0.45  Alkaline Phos 40 - 150 U/L 48 48 52  AST 5 - 34 U/L 23 20 19   ALT 0 - 55 U/L 20 17 15     PATHOLOGY REPORT: Diagnosis 08/01/2014  1. Colon, biopsy, ascending and cecum, polyp (7) - TUBULAR ADENOMAS (7). NO HIGH GRADE  DYSPLASIA OR MALIGNANCY IDENTIFIED. 2. Colon, biopsy, hepatic flexure, polyp (3) - TUBULAR ADENOMAS (3). NO HIGH GRADE DYSPLASIA OR MALIGNANCY IDENTIFIED. 3. Colon, biopsy, transverse - TWO FRAGMENTS OF TUBULAR ADENOMA. NO HIGH GRADE DYSPLASIA OR MALIGNANCY  IDENTIFIED. 4. Colon, biopsy, descending, sigmoid - TUBULAR ADENOMAS (3). NO HIGH GRADE DYSPLASIA OR MALIGNANCY IDENTIFIED. 5. Rectum, biopsy, distal mass - SQUAMOUS CELL CARCINOMA, BASALOID TYPE. - SEE MICROSCOPIC DESCRIPTION.  Colon, polyp(s), distal ascending 08/18/2014 - MULTIPLE FRAGMENTS OF TUBULAR ADENOMA. - NO HIGH GRADE DYSPLASIA OR MALIGNANCY. RADIOGRAPHIC STUDIES:  Ct Chest, Abdomen Pelvis W Contrast 08/04/2014   IMPRESSION: 1. No acute process or evidence of metastatic disease in the chest, abdomen, or pelvis. 2. Mild motion degradation involving the abdomen and upper pelvis. 3. Advanced atherosclerosis, including within the coronary arteries.   Electronically Signed   By: Abigail Miyamoto M.D.   On: 08/04/2014 16:42   Colonoscopy 08/01/2014 They were adjacent to eachother. They were located on both sides of a fold, sessile, slightly firm. The largest of the two (8mm) was biopsied and then the site was labled with SPOT via submucosal injection. These biopsies were in jar #3. The other 12 polyp were all sessile, located in cecum, ascending, hepatic flexure, descending and sigmoid. Two were removed with snare cautery (ascending and sigmoid), and the rest removed with cold snare. Sent in three different jars (#1 ascending, cecum), (#2hepatic flexure), (#4 descending, sigmoid). There was also an ulcerated, malignant appearing distal rectal mass that was adjacent to the internal anal sphincter. This was non-cirumferential, 3-5cm across, friable, biopsied extensively (#5). There were 8-10 diminutive polyps spread throughout the colon that I did not remove (2-41mm each). Retroflexed views revealed no abnormalities. The time to cecum = 74min  Withdrawal time = 25min The scope was withdrawn and the procedure completed.  COMPLICATIONS: There were no immediate complications.  ENDOSCOPIC IMPRESSION: Multiple sites of significant neoplasia, see above   PET 08/25/2014 IMPRESSION: 1. Hypermetabolic activity at the anus corresponding with known squamous cell carcinoma. No evidence of metastatic disease. 2. No other suspicious metabolic activity. 3. Diffuse atherosclerosis. 4. 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  COLONOSCOPY 08/18/2014 ENDOSCOPIC IMPRESSION: In the distal ascending colon, near the hepatic flexure the two sessile polyps were again located. One was 1cm, the other about 1.7cm along a fold. Neither now appeared cancerous. Previous tatoo at the site was somewhat visible. With external pressure and manipulation of the colon by Dr. Marcello Moores using laparscopic technique I was able to resect the polyps. I used saline pressure injector to lift the polyps and then piecemeal snare/cautery technique. I felt there was a small amount of remaining polyp tissue at the site and I used snare tip cautery to cauterize it. Three other small (2-13mm) clearly adenomatous polyps were removed from the right colon (not retrieved). The examination was otherwise normal  RECOMMENDATIONS: Await final pathology, likely you will need repeat colonoscopy in 1 year.   ASSESSMENT & PLAN:  69 year old Caucasian female with past medical history of hypertension, anxiety and depression, mild arthritis, who presents with watery diarrhea for 4 months.  1. Rectal squamous cell carcinoma -I reviewed her colonoscopy findings, the rectal mass biopsy results with patient and her son in great details. -We reviewed the natural history of rectal cancer, and staging workup. CT scans reviewed no local adenopathy or distant metastasis. I think she most likely have early stage rectal cancer. -Giving the  tumor location in the low rectum, and squamous cell histology, I think concurrent chemoradiation is favored. I would recommend IV 5-FU and mitomycin chemotherapy with concurrent radiation, on day 1-5, and day 28-32, similar to anal cancer regimen. The goal of treatment is curative -She is  on week for treatment, counts are recovering well, likely will be ready for second round of chemotherapy next week, we'll arrange a PICC line insertion on next Monday before chemotherapy  2. Smoking cessation -We discussed the smoking cessation extensively today, she has tried quit smoking a few times in the past and failed. -She states she is not ready to quit smoking, when she is going through the cancer diagnosis and treatment. She'll consider after she completes all treatments.  3. Hypertension, anxiety -She'll continue follow-up with her primary care physician  Plan -PICC line placement on Monday 7/25 -second cycle concurrent chemoradiation with 5-FU and mitomycin starting next Monday 7/25 -I'll see her back once a week for the next 3 weeks   All questions were answered. The patient knows to call the clinic with any problems, questions or concerns. I spent 25 minutes counseling the patient face to face. The total time spent in the appointment was 30 minutes and more than 50% was on counseling.     Truitt Merle, MD 09/27/2014 6:16 PM

## 2014-09-27 NOTE — Telephone Encounter (Signed)
eror

## 2014-09-27 NOTE — Telephone Encounter (Signed)
Glenda Stevens, asked if I needed to clarify MMW rx, pr Monica, No Dr. Pablo Ledger wrote on 09/13/14 with refill will refill this today", thanked [facility], will let Patient know she can pick up after her rad tx today and if any questions ask for [facility] 1:22 PM

## 2014-09-28 ENCOUNTER — Ambulatory Visit
Admission: RE | Admit: 2014-09-28 | Discharge: 2014-09-28 | Disposition: A | Discharge status: Home / Self Care | Payer: Medicare Other | Source: Ambulatory Visit | Attending: Radiation Oncology | Admitting: Radiation Oncology

## 2014-09-28 DIAGNOSIS — C20 Malignant neoplasm of rectum: Secondary | ICD-10-CM | POA: Diagnosis not present

## 2014-09-29 ENCOUNTER — Ambulatory Visit
Admission: RE | Admit: 2014-09-29 | Discharge: 2014-09-29 | Disposition: A | Discharge status: Home / Self Care | Payer: Medicare Other | Source: Ambulatory Visit | Attending: Radiation Oncology | Admitting: Radiation Oncology

## 2014-09-29 ENCOUNTER — Other Ambulatory Visit: Payer: Self-pay | Admitting: Radiology

## 2014-09-29 DIAGNOSIS — C20 Malignant neoplasm of rectum: Secondary | ICD-10-CM | POA: Diagnosis not present

## 2014-09-30 ENCOUNTER — Ambulatory Visit
Admission: RE | Admit: 2014-09-30 | Discharge: 2014-09-30 | Disposition: A | Discharge status: Home / Self Care | Payer: Medicare Other | Source: Ambulatory Visit | Attending: Radiation Oncology | Admitting: Radiation Oncology

## 2014-09-30 ENCOUNTER — Other Ambulatory Visit: Payer: Self-pay | Admitting: Radiology

## 2014-09-30 DIAGNOSIS — C20 Malignant neoplasm of rectum: Secondary | ICD-10-CM

## 2014-09-30 NOTE — Progress Notes (Signed)
Weekly rad txs anal 18/28 completed,  Anal 18/28 completed,  Still has diarrhea takes imodium prn, no nausea, chemo next Monday-Friday, has mmw for her mucositis which is improved, appetite better for now, energy level good, bottom is okay,uses baby wipes, hasn't had to use sitz bath as yet, saw Dr. Burr Medico and said her bottom looks good no breakdown 3:49 PM There were no vitals taken for this visit. Wt Readings from Last 3 Encounters:  09/27/14 149 lb 4.8 oz (67.722 kg)  09/21/14 148 lb 9.6 oz (67.405 kg)  09/20/14 148 lb 3.2 oz (67.223 kg)

## 2014-09-30 NOTE — Progress Notes (Signed)
  Radiation Oncology         [phone]   Name: Glenda Stevens MRN: [id number]   Date: 09/30/2014  DOB: 09/29/1945     Weekly Radiation Therapy Management    ICD-9-CM ICD-10-CM   1. Rectal cancer 154.1 C20     Current Dose: 32.4 Gy  Planned Dose:  50.4 Gy  Narrative The patient presents for routine under treatment assessment. Weekly rad txs anal 18/28 completed, Anal 18/28 completed, Still has diarrhea takes imodium prn, no nausea, chemo next Monday-Friday, has mmw for her mucositis which is improved, appetite better for now, energy level good, bottom is okay,uses baby wipes, hasn't had to use sitz bath as yet, saw Dr. Burr Medico and said her bottom looks good no breakdown.  The patient is without complaint. Set-up films were reviewed. The chart was checked.  Physical Findings  vitals were not taken for this visit.. Weight essentially stable.  No significant changes.  Impression The patient is tolerating radiation.  Plan Continue treatment as planned.   This document serves as a record of services personally performed by Tyler Pita, MD. It was created on his behalf by Arlyce Harman, a trained medical scribe. The creation of this record is based on the scribe's personal observations and the provider's statements to them. This document has been checked and approved by the attending provider.      Sheral Apley Tammi Klippel, M.D.

## 2014-10-01 ENCOUNTER — Ambulatory Visit: Payer: Medicare Other

## 2014-10-03 ENCOUNTER — Ambulatory Visit
Admission: RE | Admit: 2014-10-03 | Discharge: 2014-10-03 | Disposition: A | Discharge status: Home / Self Care | Payer: Medicare Other | Source: Ambulatory Visit | Attending: Radiation Oncology | Admitting: Radiation Oncology

## 2014-10-03 ENCOUNTER — Ambulatory Visit (HOSPITAL_COMMUNITY)
Admission: RE | Admit: 2014-10-03 | Discharge: 2014-10-03 | Disposition: A | Discharge status: Home / Self Care | Payer: Medicare Other | Source: Ambulatory Visit | Attending: Hematology | Admitting: Hematology

## 2014-10-03 ENCOUNTER — Encounter: Payer: Self-pay | Admitting: *Deleted

## 2014-10-03 ENCOUNTER — Ambulatory Visit: Payer: Medicare Other | Admitting: Nutrition

## 2014-10-03 ENCOUNTER — Ambulatory Visit (HOSPITAL_BASED_OUTPATIENT_CLINIC_OR_DEPARTMENT_OTHER): Payer: Medicare Other

## 2014-10-03 ENCOUNTER — Other Ambulatory Visit: Payer: Self-pay | Admitting: Hematology

## 2014-10-03 VITALS — BP 108/82 | HR 81 | Temp 98.4°F | Resp 17

## 2014-10-03 DIAGNOSIS — C20 Malignant neoplasm of rectum: Secondary | ICD-10-CM

## 2014-10-03 DIAGNOSIS — Z5111 Encounter for antineoplastic chemotherapy: Secondary | ICD-10-CM | POA: Diagnosis not present

## 2014-10-03 MED ORDER — HEPARIN SOD (PORK) LOCK FLUSH 100 UNIT/ML IV SOLN
250.0000 [IU] | Freq: Once | INTRAVENOUS | Status: DC | PRN
  Filled 2014-10-03: qty 5

## 2014-10-03 MED ORDER — LIDOCAINE HCL 1 % IJ SOLN
INTRAMUSCULAR | Status: AC
  Filled 2014-10-03: qty 20

## 2014-10-03 MED ORDER — MITOMYCIN CHEMO IV INJECTION 20 MG
10.0000 mg/m2 | Freq: Once | INTRAVENOUS | Status: AC
  Administered 2014-10-03: 17.5 mg via INTRAVENOUS
  Filled 2014-10-03: qty 35

## 2014-10-03 MED ORDER — SODIUM CHLORIDE 0.9 % IV SOLN
Freq: Once | INTRAVENOUS | Status: AC
  Administered 2014-10-03: 12:00:00 via INTRAVENOUS
  Filled 2014-10-03: qty 4

## 2014-10-03 MED ORDER — SODIUM CHLORIDE 0.9 % IV SOLN
1000.0000 mg/m2/d | INTRAVENOUS | Status: DC
  Administered 2014-10-03: 7000 mg via INTRAVENOUS
  Filled 2014-10-03: qty 140

## 2014-10-03 MED ORDER — SODIUM CHLORIDE 0.9 % IJ SOLN
10.0000 mL | INTRAMUSCULAR | Status: DC | PRN
  Filled 2014-10-03: qty 10

## 2014-10-03 MED ORDER — SODIUM CHLORIDE 0.9 % IV SOLN
Freq: Once | INTRAVENOUS | Status: AC
  Administered 2014-10-03: 12:00:00 via INTRAVENOUS

## 2014-10-03 NOTE — Progress Notes (Signed)
Oncology Nurse Navigator Documentation  Oncology Nurse Navigator Flowsheets 10/03/2014  Referral date to RadOnc/MedOnc -  Navigator Encounter Type Treatment  Patient Visit Type Medonc  Treatment Phase Final Chemo TX  Barriers/Navigation Needs No barriers at this time-reviewed prevention and management of mouth sores and when to call office.  Interventions None required  Support Groups/Services -  Time Spent with Patient 10  Reports son cooks for her and drives her as needed. Says her spirits are OK-she keeps herself realistic and does not allow herself to feel sorry for herself. Offered to keep PICC line for a week after tx in case she needs IVF afterwards. She declines-I would rather get rid of the PICC and get stuck if necessary.

## 2014-10-03 NOTE — Progress Notes (Signed)
Upon initial assessment to infusion room, pt reports having mouth sores with last treatment.  We discussed the importance of mouth rinses as well as notifying us at first sign of having any soreness in the mouth.  Pt verbalizes understanding and states she has MMW at home from previous mucositis.

## 2014-10-03 NOTE — Procedures (Signed)
Successful RUE SL POWER PICC TIP SVC/RA NO COMP STABLE 36CM READY FOR USE

## 2014-10-03 NOTE — Progress Notes (Signed)
Patient was identified to be at risk for malnutrition on the MST secondary to weight loss and poor appetite.  Patient is a 69 year old female diagnosed with rectal cancer receiving chemotherapy and radiation therapy.  She is a patient of Dr. Pablo Ledger and Dr. Burr Medico.  Past medical history includes renal artery stenosis, hypertension, hyperlipidemia, GERD, arthritis, and colon resection.  Medications include Xanax, Magic mouthwash, multivitamin, Zofran, and Zoloft.  Labs include sodium of 133, July 6.  Height: 5 feet 2 inches. Weight: 149.3 pounds July 19 Usual body weight: 157 pounds January 2016. BMI: 27.3.  Patient reports her appetite and weight have improved. Patient states she had approximately 5-7 days of mouth sores and inadequate oral intake. Patient states she tolerated Carnation instant breakfast. Reports diarrhea is ongoing however, it is controlled.  Nutrition diagnosis: Unintended weight loss related to rectal cancer and associated treatments as evidenced by 8 pound weight loss from usual body weight.  Intervention: Patient educated to consume small, frequent meals and snacks utilizing high-calorie high-protein foods.  Fact sheet was provided. Educated patient goal is weight maintenance. Recommended patient continue oral nutrition supplements as needed. Educated patient on strategies for improving mouth sores and diarrhea.  Fact sheets were provided. Questions were answered and teach back method was used. My contact information was provided.  Monitoring, evaluation, goals: Patient will tolerate adequate calories and protein to promote weight maintenance.  Next visit: Patient agrees to contact me with questions or concerns.  **Disclaimer: This note was dictated with voice recognition software. Similar sounding words can inadvertently be transcribed and this note may contain transcription errors which may not have been corrected upon publication of note.**

## 2014-10-03 NOTE — Patient Instructions (Signed)
[facility] Discharge Instructions for Patients Receiving Chemotherapy  Today you received the following chemotherapy agents Mitomycin/Fluorouricil.  To help prevent nausea and vomiting after your treatment, we encourage you to take your nausea medication as directed.   If you develop nausea and vomiting that is not controlled by your nausea medication, call the clinic.   BELOW ARE SYMPTOMS THAT SHOULD BE REPORTED IMMEDIATELY:  *FEVER GREATER THAN 100.5 F  *CHILLS WITH OR WITHOUT FEVER  NAUSEA AND VOMITING THAT IS NOT CONTROLLED WITH YOUR NAUSEA MEDICATION  *UNUSUAL SHORTNESS OF BREATH  *UNUSUAL BRUISING OR BLEEDING  TENDERNESS IN MOUTH AND THROAT WITH OR WITHOUT PRESENCE OF ULCERS  *URINARY PROBLEMS  *BOWEL PROBLEMS  UNUSUAL RASH Items with * indicate a potential emergency and should be followed up as soon as possible.  Feel free to call the clinic you have any questions or concerns. The clinic phone number is [phone].  Please show the [facility] at check-in to the Emergency Department and triage nurse.

## 2014-10-04 ENCOUNTER — Other Ambulatory Visit (HOSPITAL_BASED_OUTPATIENT_CLINIC_OR_DEPARTMENT_OTHER): Payer: Medicare Other

## 2014-10-04 ENCOUNTER — Ambulatory Visit: Payer: Medicare Other

## 2014-10-04 ENCOUNTER — Ambulatory Visit
Admission: RE | Admit: 2014-10-04 | Discharge: 2014-10-04 | Disposition: A | Discharge status: Home / Self Care | Payer: Medicare Other | Source: Ambulatory Visit | Attending: Radiation Oncology | Admitting: Radiation Oncology

## 2014-10-04 DIAGNOSIS — C20 Malignant neoplasm of rectum: Secondary | ICD-10-CM | POA: Diagnosis not present

## 2014-10-04 LAB — CBC WITH DIFFERENTIAL/PLATELET
BASO%: 0.6 % (ref 0.0–2.0)
BASOS ABS: 0 10*3/uL (ref 0.0–0.1)
EOS%: 0.2 % (ref 0.0–7.0)
Eosinophils Absolute: 0 10*3/uL (ref 0.0–0.5)
HCT: 37.2 % (ref 34.8–46.6)
HGB: 12.6 g/dL (ref 11.6–15.9)
LYMPH%: 8.6 % — AB (ref 14.0–49.7)
MCH: 30.4 pg (ref 25.1–34.0)
MCHC: 33.8 g/dL (ref 31.5–36.0)
MCV: 90 fL (ref 79.5–101.0)
MONO#: 0.6 10*3/uL (ref 0.1–0.9)
MONO%: 9.3 % (ref 0.0–14.0)
NEUT#: 5.2 10*3/uL (ref 1.5–6.5)
NEUT%: 81.3 % — AB (ref 38.4–76.8)
Platelets: 139 10*3/uL — ABNORMAL LOW (ref 145–400)
RBC: 4.13 10*6/uL (ref 3.70–5.45)
RDW: 13.2 % (ref 11.2–14.5)
WBC: 6.4 10*3/uL (ref 3.9–10.3)
lymph#: 0.5 10*3/uL — ABNORMAL LOW (ref 0.9–3.3)

## 2014-10-04 LAB — COMPREHENSIVE METABOLIC PANEL (CC13)
ALT: 17 U/L (ref 0–55)
ANION GAP: 7 meq/L (ref 3–11)
AST: 19 U/L (ref 5–34)
Albumin: 3.3 g/dL — ABNORMAL LOW (ref 3.5–5.0)
Alkaline Phosphatase: 45 U/L (ref 40–150)
BUN: 10.4 mg/dL (ref 7.0–26.0)
CALCIUM: 8.9 mg/dL (ref 8.4–10.4)
CO2: 25 meq/L (ref 22–29)
Chloride: 102 mEq/L (ref 98–109)
Creatinine: 0.7 mg/dL (ref 0.6–1.1)
EGFR: 88 mL/min/{1.73_m2} — ABNORMAL LOW (ref >90)
Glucose: 92 mg/dl (ref 70–140)
POTASSIUM: 3.8 meq/L (ref 3.5–5.1)
SODIUM: 135 meq/L — AB (ref 136–145)
Total Bilirubin: 0.26 mg/dL (ref 0.20–1.20)
Total Protein: 6 g/dL — ABNORMAL LOW (ref 6.4–8.3)

## 2014-10-05 ENCOUNTER — Ambulatory Visit
Admission: RE | Admit: 2014-10-05 | Discharge: 2014-10-05 | Disposition: A | Discharge status: Home / Self Care | Payer: Medicare Other | Source: Ambulatory Visit | Attending: Radiation Oncology | Admitting: Radiation Oncology

## 2014-10-05 ENCOUNTER — Ambulatory Visit: Payer: Medicare Other

## 2014-10-05 DIAGNOSIS — C20 Malignant neoplasm of rectum: Secondary | ICD-10-CM | POA: Diagnosis not present

## 2014-10-05 MED ORDER — HEPARIN SOD (PORK) LOCK FLUSH 100 UNIT/ML IV SOLN
500.0000 [IU] | Freq: Once | INTRAVENOUS | Status: DC | PRN
  Filled 2014-10-05: qty 5

## 2014-10-05 MED ORDER — SODIUM CHLORIDE 0.9 % IJ SOLN
10.0000 mL | INTRAMUSCULAR | Status: DC | PRN
  Filled 2014-10-05: qty 10

## 2014-10-05 NOTE — Patient Instructions (Signed)
PICC Home Guide A peripherally inserted central catheter (PICC) is a long, thin, flexible tube that is inserted into a vein in the upper arm. It is a form of intravenous (IV) access. It is considered to be a "central" line because the tip of the PICC ends in a large vein in your chest. This large vein is called the superior vena cava (SVC). The PICC tip ends in the SVC because there is a lot of blood flow in the SVC. This allows medicines and IV fluids to be quickly distributed throughout the body. The PICC is inserted using a sterile technique by a specially trained nurse or physician. After the PICC is inserted, a chest X-ray exam is done to be sure it is in the correct place.  A PICC may be placed for different reasons, such as:  To give medicines and liquid nutrition that can only be given through a central line. Examples are:  Certain antibiotic treatments.  Chemotherapy.  Total parenteral nutrition (TPN).  To take frequent blood samples.  To give IV fluids and blood products.  If there is difficulty placing a peripheral intravenous (PIV) catheter. If taken care of properly, a PICC can remain in place for several months. A PICC can also allow a person to go home from the hospital early. Medicine and PICC care can be managed at home by a family member or home health care team. WHAT PROBLEMS CAN HAPPEN WHEN I HAVE A PICC? Problems with a PICC can occasionally occur. These may include the following:  A blood clot (thrombus) forming in or at the tip of the PICC. This can cause the PICC to become clogged. A clot-dissolving medicine called tissue plasminogen activator (tPA) can be given through the PICC to help break up the clot.  Inflammation of the vein (phlebitis) in which the PICC is placed. Signs of inflammation may include redness, pain at the insertion site, red streaks, or being able to feel a "cord" in the vein where the PICC is located.  Infection in the PICC or at the insertion  site. Signs of infection may include fever, chills, redness, swelling, or pus drainage from the PICC insertion site.  PICC movement (malposition). The PICC tip may move from its original position due to excessive physical activity, forceful coughing, sneezing, or vomiting.  A break or cut in the PICC. It is important to not use scissors near the PICC.  Nerve or tendon irritation or injury during PICC insertion. WHAT SHOULD I KEEP IN MIND ABOUT ACTIVITIES WHEN I HAVE A PICC?  You may bend your arm and move it freely. If your PICC is near or at the bend of your elbow, avoid activity with repeated motion at the elbow.  Rest at home for the remainder of the day following PICC line insertion.  Avoid lifting heavy objects as instructed by your health care provider.  Avoid using a crutch with the arm on the same side as your PICC. You may need to use a walker. WHAT SHOULD I KNOW ABOUT MY PICC DRESSING?  Keep your PICC bandage (dressing) clean and dry to prevent infection.  Ask your health care provider when you may shower. Ask your health care provider to teach you how to wrap the PICC when you do take a shower.  Change the PICC dressing as instructed by your health care provider.  Change your PICC dressing if it becomes loose or wet. WHAT SHOULD I KNOW ABOUT PICC CARE?  Check the PICC insertion site   daily for leakage, redness, swelling, or pain.  Do not take a bath, swim, or use hot tubs when you have a PICC. Cover PICC line with clear plastic wrap and tape to keep it dry while showering.  Flush the PICC as directed by your health care provider. Let your health care provider know right away if the PICC is difficult to flush or does not flush. Do not use force to flush the PICC.  Do not use a syringe that is less than 10 mL to flush the PICC.  Never pull or tug on the PICC.  Avoid blood pressure checks on the arm with the PICC.  Keep your PICC identification card with you at all  times.  Do not take the PICC out yourself. Only a trained clinical professional should remove the PICC. SEEK IMMEDIATE MEDICAL CARE IF:  Your PICC is accidentally pulled all the way out. If this happens, cover the insertion site with a bandage or gauze dressing. Do not throw the PICC away. Your health care provider will need to inspect it.  Your PICC was tugged or pulled and has partially come out. Do not  push the PICC back in.  There is any type of drainage, redness, or swelling where the PICC enters the skin.  You cannot flush the PICC, it is difficult to flush, or the PICC leaks around the insertion site when it is flushed.  You hear a "flushing" sound when the PICC is flushed.  You have pain, discomfort, or numbness in your arm, shoulder, or jaw on the same side as the PICC.  You feel your heart "racing" or skipping beats.  You notice a hole or tear in the PICC.  You develop chills or a fever. MAKE SURE YOU:   Understand these instructions.  Will watch your condition.  Will get help right away if you are not doing well or get worse. Document Released: 09/01/2002 Document Revised: 07/12/2013 Document Reviewed: 11/02/2012 ExitCare Patient Information 2015 [employer]. This information is not intended to replace advice given to you by your health care provider. Make sure you discuss any questions you have with your health care provider.  

## 2014-10-06 ENCOUNTER — Ambulatory Visit: Admission: RE | Admit: 2014-10-06 | Payer: Medicare Other | Source: Ambulatory Visit

## 2014-10-06 ENCOUNTER — Ambulatory Visit: Payer: Medicare Other

## 2014-10-07 ENCOUNTER — Ambulatory Visit
Admission: RE | Admit: 2014-10-07 | Discharge: 2014-10-07 | Disposition: A | Discharge status: Home / Self Care | Payer: Medicare Other | Source: Ambulatory Visit | Attending: Radiation Oncology | Admitting: Radiation Oncology

## 2014-10-07 ENCOUNTER — Ambulatory Visit: Payer: Medicare Other

## 2014-10-07 ENCOUNTER — Telehealth: Payer: Self-pay | Admitting: *Deleted

## 2014-10-07 DIAGNOSIS — C20 Malignant neoplasm of rectum: Secondary | ICD-10-CM | POA: Diagnosis not present

## 2014-10-07 NOTE — Patient Instructions (Signed)
PICC Removal °A peripherally inserted central catheter (PICC) is a long, thin, flexible tube that a health care provider can insert into a vein in your upper arm. It is a type of IV. Having a PICC in place gives health care providers quick access to your veins. It is a good way to distribute medicines and fluids quickly throughout your body. °LET YOUR HEALTH CARE PROVIDER KNOW ABOUT: °· Any allergies you have. °· All medicines you are taking, including vitamins, herbs, eye drops, creams, and over-the-counter medicines. °· Previous problems you or members of your family have had with the use of anesthetics. °· Any blood disorders you have. °· Previous surgeries you have had. °· Medical conditions you have. °RISKS AND COMPLICATIONS °Generally, this is a safe procedure. However, as with any procedure, problems can occur. Possible problems include: °· Bleeding. °· Infection. °BEFORE THE PROCEDURE °You need an order from your health care provider to have your PICC removed. Only a health care provider trained in PICC removal should take it out. You may have your PICC removed in the hospital or in an outpatient setting. °PROCEDURE °Having a PICC removed is usually painless. Removal of the tape that holds the PICC in place may be the most uncomfortable part. Do not take out the PICC yourself. Only a trained clinical professional, such as a PICC nurse, should remove it. If your health care provider thinks your PICC is infected, the tip may be sent to the lab for testing. °After taking out your PICC, your health care provider may:  °· Hold gentle pressure on the exit site. °· Apply some antibiotic ointment. °· Place a small bandage over the insertion site. °AFTER THE PROCEDURE °You should be able to remove the bandage after 24 hours. Follow all your health care provider's instructions.  °· Keep the insertion site clean by washing it gently with soap and water. °· Do not pick or remove a scab. °· Avoid strenuous physical  activity for a day or two. °· Let your health care provider know if you develop redness, soreness, bleeding, swelling, or drainage from the insertion site. °· Let your health care provider know if you develop chills or fever. °Document Released: 08/15/2009 Document Revised: 03/02/2013 Document Reviewed: 12/18/2012 °ExitCare® Patient Information ©2015 [employer]. This information is not intended to replace advice given to you by your health care provider. Make sure you discuss any questions you have with your health care provider. ° °

## 2014-10-07 NOTE — Progress Notes (Signed)
14:30: dressing to right arm CDI. No swelling or bleeding noted. Patient denies discomfort and pain. Discharge paperwork given and reviewed with patient and family member. Patient verbalized understanding.

## 2014-10-07 NOTE — Telephone Encounter (Signed)
Called patient home and left vm to have patient here Monday 1230 to see MD 1st before tx,mmissed Friday,infomred Dr. Edilia Bo had to go to infusion and didn't return to be sen afterwards 3:58 PM

## 2014-10-10 ENCOUNTER — Ambulatory Visit: Payer: Medicare Other

## 2014-10-10 ENCOUNTER — Ambulatory Visit
Admission: RE | Admit: 2014-10-10 | Discharge: 2014-10-10 | Disposition: A | Discharge status: Home / Self Care | Payer: Medicare Other | Source: Ambulatory Visit | Attending: Radiation Oncology | Admitting: Radiation Oncology

## 2014-10-10 ENCOUNTER — Telehealth: Payer: Self-pay | Admitting: *Deleted

## 2014-10-10 ENCOUNTER — Encounter: Payer: Self-pay | Admitting: Radiation Oncology

## 2014-10-10 ENCOUNTER — Other Ambulatory Visit: Payer: Self-pay | Admitting: Hematology

## 2014-10-10 VITALS — BP 129/54 | HR 82 | Temp 98.6°F | Ht 62.0 in | Wt 146.5 lb

## 2014-10-10 DIAGNOSIS — C20 Malignant neoplasm of rectum: Secondary | ICD-10-CM | POA: Diagnosis not present

## 2014-10-10 MED ORDER — DIPHENOXYLATE-ATROPINE 2.5-0.025 MG PO TABS: 1.0000 | ORAL_TABLET | Freq: Four times a day (QID) | ORAL | Status: DC | PRN

## 2014-10-10 NOTE — Progress Notes (Signed)
Department of Radiation Oncology  Phone:  [phone] Fax:        [phone]  Weekly Treatment Note    Name: Glenda Stevens Date: 10/10/2014 MRN: [id number] DOB: 02-25-1946   Current dose: 41.4 Gy  Current fraction: 24   MEDICATIONS: Current Outpatient Prescriptions  Medication Sig Dispense Refill  . ALPRAZolam (XANAX) 0.25 MG tablet Take 0.125 mg by mouth daily as needed for anxiety.    . Alum & Mag Hydroxide-Simeth (MAGIC MOUTHWASH) SOLN Take 5 mLs by mouth 3 (three) times daily as needed for mouth pain. Called [facility] out patient pharmacy (Patient taking differently: Take 5 mLs by mouth 3 (three) times daily as needed for mouth pain (refilled 09/13/14). Called [place] out patient pharmacy) 300 mL 0  . atenolol (TENORMIN) 50 MG tablet Take 1 tablet (50 mg total) by mouth daily. (Patient taking differently: Take 50 mg by mouth daily after supper. ) 90 tablet 3  . candesartan (ATACAND) 32 MG tablet Take 1 tablet (32 mg total) by mouth daily. <PLEASE MAKE APPOINTMENT FOR REFILLS> 90 tablet 0  . cetirizine (ZYRTEC ALLERGY) 10 MG tablet Take 5 mg by mouth daily as needed for allergies.    . diphenoxylate-atropine (LOMOTIL) 2.5-0.025 MG per tablet Take 1 tablet by mouth 4 (four) times daily as needed for diarrhea or loose stools. 30 tablet 3  . Multiple Vitamin (MULTIVITAMIN) tablet Take 1 tablet by mouth daily.    . ondansetron (ZOFRAN) 8 MG tablet Take 1 tablet (8 mg total) by mouth every 8 (eight) hours as needed for nausea or vomiting. 20 tablet 3  . ranitidine (ZANTAC) 150 MG tablet Take 150 mg by mouth daily as needed for heartburn.    . sertraline (ZOLOFT) 100 MG tablet Take 100 mg by mouth daily.     . traMADol (ULTRAM) 50 MG tablet Take 1 tablet (50 mg total) by mouth every 6 (six) hours as needed. 30 tablet 0  . fluconazole (DIFLUCAN) 150 MG tablet Take 1 tablet (150 mg total) by mouth once. Repeat dose in 7 days if symptoms are still present. (Patient not taking: Reported  on 10/10/2014) 2 tablet 0  . miconazole (MICOTIN) 2 % cream Apply 1 application topically as needed.    . nystatin (MYCOSTATIN) 100000 UNIT/ML suspension Take 1,000,000 Units by mouth.    . valACYclovir (VALTREX) 1000 MG tablet Take 1 tablet (1,000 mg total) by mouth 3 (three) times daily. (Patient not taking: Reported on 10/10/2014) 21 tablet 0   No current facility-administered medications for this encounter.     ALLERGIES: Crestor   LABORATORY DATA:  Lab Results  Component Value Date   WBC 6.4 10/04/2014   HGB 12.6 10/04/2014   HCT 37.2 10/04/2014   MCV 90.0 10/04/2014   PLT 139* 10/04/2014   Lab Results  Component Value Date   NA 135* 10/04/2014   K 3.8 10/04/2014   CL 101 08/17/2014   CO2 25 10/04/2014   Lab Results  Component Value Date   ALT 17 10/04/2014   AST 19 10/04/2014   ALKPHOS 45 10/04/2014   BILITOT 0.26 10/04/2014     NARRATIVE: Glenda Stevens was seen today for weekly treatment management. The chart was checked and the patient's films were reviewed.  Glenda Stevens has received 23 fractions to her anal region.  She reports fatigue as well as increasing rectal pain as a level 8/10. Reports soft to watery diarrhea ~ 4 times daily since this weekend with scant blood on tissue  when she wipes.  States that A&D ointment in the rectal area is affording only minimal, requesting alternative.  She also reports "burning a little bit when voiding. Note diffuse thrush in oral cavity with sore throat with difficulty swallowing due to thick phlegm.  PHYSICAL EXAMINATION: height is 5\' 2"  (1.575 m) and weight is 146 lb 8 oz (66.452 kg). Her temperature is 98.6 F (37 C). Her blood pressure is 129/54 and her pulse is 82. Her oxygen saturation is 100%.      Observed diffuse mucositis and or thrush in mouth.  ASSESSMENT: The patient is doing satisfactorily with treatment. Patient has diffuse mucositis perhaps thrush interspersed in mouth.    PLAN: We will continue with the patient's  radiation treatment as planned. Patient will call nursing about ingredients of their current prescription of Magic Mouthwash to determine if it will be effective in treating mucositis/thrush.   ------------------------------------------------  Jodelle Gross, MD, PhD  This document serves as a record of services personally performed by Kyung Rudd, MD. It was created on his behalf by Derek Mound, a trained medical scribe. The creation of this record is based on the scribe's personal observations and the provider's statements to them. This document has been checked and approved by the attending provider.

## 2014-10-10 NOTE — Telephone Encounter (Signed)
"  I need something ordered for my bowels.  Dr. Burr Medico said there is something that can be ordered and I've already gone three times this morning.  I've taken immodium since my second chemo last week,  Now everything goes straight through me.  I use [facility] at [place].  Return number [phone] but I'll be there 12:30 to 1:30."

## 2014-10-10 NOTE — Progress Notes (Addendum)
Glenda Stevens has received 23 fractions to her anal region.  She reports fatigue as well as increasing rectal pain as a level 8/10. Reports soft to watery diarrhea ~ 4 times daily since this weekend with scant blood on tissue when she wipes.  States that A&D ointment in the rectal area is affording only minimal relief, therefore, requesting alternative.  She also reports "burning a little bit" when voiding.   Note diffuse thrush in oral cavity with report of a sore throat with difficulty swallowing due to thick phlegm.

## 2014-10-10 NOTE — Telephone Encounter (Signed)
Thu,  Could you call her pharmacy to see if they will accept lomotil prescription by fax? Thanks  Truitt Merle  10/10/2014

## 2014-10-10 NOTE — Telephone Encounter (Addendum)
Dr. Lisbeth Renshaw will recheck Glenda Stevens's oral cavity on Friday of this week to assess effectiveness of Nystatin oral suspension.  Son called to relay this information and he stated agreement.  Will schedule a PUT visit for 10/14/14

## 2014-10-11 ENCOUNTER — Other Ambulatory Visit: Payer: Medicare Other

## 2014-10-11 ENCOUNTER — Ambulatory Visit: Payer: Medicare Other

## 2014-10-11 ENCOUNTER — Ambulatory Visit
Admission: RE | Admit: 2014-10-11 | Discharge: 2014-10-11 | Disposition: A | Discharge status: Home / Self Care | Payer: Medicare Other | Source: Ambulatory Visit | Attending: Radiation Oncology | Admitting: Radiation Oncology

## 2014-10-11 NOTE — Telephone Encounter (Signed)
[facility] confirmed receipt of order that has been picked up by patient.

## 2014-10-12 ENCOUNTER — Ambulatory Visit
Admission: RE | Admit: 2014-10-12 | Discharge: 2014-10-12 | Disposition: A | Discharge status: Home / Self Care | Payer: Medicare Other | Source: Ambulatory Visit | Attending: Radiation Oncology | Admitting: Radiation Oncology

## 2014-10-12 DIAGNOSIS — C20 Malignant neoplasm of rectum: Secondary | ICD-10-CM | POA: Diagnosis not present

## 2014-10-13 ENCOUNTER — Telehealth: Payer: Self-pay | Admitting: *Deleted

## 2014-10-13 ENCOUNTER — Ambulatory Visit: Admission: RE | Admit: 2014-10-13 | Payer: Medicare Other | Source: Ambulatory Visit

## 2014-10-13 NOTE — Telephone Encounter (Signed)
Called patient home and cell phone,left voice messages on both, patient missed 1pm rad tx today, asked for patint tocall ,hope she is well 1:45 PM

## 2014-10-14 ENCOUNTER — Ambulatory Visit: Payer: Medicare Other

## 2014-10-14 ENCOUNTER — Encounter: Payer: Self-pay | Admitting: Radiation Oncology

## 2014-10-14 ENCOUNTER — Ambulatory Visit
Admission: RE | Admit: 2014-10-14 | Discharge: 2014-10-14 | Disposition: A | Discharge status: Home / Self Care | Payer: Medicare Other | Source: Ambulatory Visit | Attending: Radiation Oncology | Admitting: Radiation Oncology

## 2014-10-14 VITALS — BP 128/64 | HR 83 | Temp 99.1°F | Resp 20 | Wt 146.1 lb

## 2014-10-14 DIAGNOSIS — C210 Malignant neoplasm of anus, unspecified: Secondary | ICD-10-CM

## 2014-10-14 DIAGNOSIS — C20 Malignant neoplasm of rectum: Secondary | ICD-10-CM

## 2014-10-14 MED ORDER — SILVER SULFADIAZINE 1 % EX CREA
TOPICAL_CREAM | Freq: Every day | CUTANEOUS | Status: DC
  Administered 2014-10-14: 14:00:00 via TOPICAL

## 2014-10-14 NOTE — Progress Notes (Signed)
Gave 2 jars silvadene cream 50gm jars, instructions given to apply after rad txs daily to affected skin areas wheree needed, must wash off before coming for radiation treatments  Daily, verbal understanding by patient teach back given, will give 2 eek follow up after last treatment next wednesday 2:18 PM

## 2014-10-14 NOTE — Progress Notes (Signed)
Department of Radiation Oncology  Phone:  [phone] Fax:        [phone]  Weekly Treatment Note    Name: Glenda Stevens Date: 10/14/2014 MRN: [id number] DOB: 1945/08/28   Current dose: 45 Gy  Current fraction:25   MEDICATIONS: Current Outpatient Prescriptions  Medication Sig Dispense Refill  . ALPRAZolam (XANAX) 0.25 MG tablet Take 0.125 mg by mouth daily as needed for anxiety.    . Alum & Mag Hydroxide-Simeth (MAGIC MOUTHWASH) SOLN Take 5 mLs by mouth 3 (three) times daily as needed for mouth pain. Called [place] out patient pharmacy (Patient taking differently: Take 5 mLs by mouth 3 (three) times daily as needed for mouth pain (refilled 09/13/14). Called [facility] out patient pharmacy) 300 mL 0  . atenolol (TENORMIN) 50 MG tablet Take 1 tablet (50 mg total) by mouth daily. (Patient taking differently: Take 50 mg by mouth daily after supper. ) 90 tablet 3  . candesartan (ATACAND) 32 MG tablet Take 1 tablet (32 mg total) by mouth daily. <PLEASE MAKE APPOINTMENT FOR REFILLS> 90 tablet 0  . cetirizine (ZYRTEC ALLERGY) 10 MG tablet Take 5 mg by mouth daily as needed for allergies.    . diphenoxylate-atropine (LOMOTIL) 2.5-0.025 MG per tablet Take 1 tablet by mouth 4 (four) times daily as needed for diarrhea or loose stools. 30 tablet 3  . fluconazole (DIFLUCAN) 150 MG tablet Take 1 tablet (150 mg total) by mouth once. Repeat dose in 7 days if symptoms are still present. 2 tablet 0  . miconazole (MICOTIN) 2 % cream Apply 1 application topically as needed.    . Multiple Vitamin (MULTIVITAMIN) tablet Take 1 tablet by mouth daily.    Marland Kitchen nystatin (MYCOSTATIN) 100000 UNIT/ML suspension Take 1,000,000 Units by mouth.    . ondansetron (ZOFRAN) 8 MG tablet Take 1 tablet (8 mg total) by mouth every 8 (eight) hours as needed for nausea or vomiting. 20 tablet 3  . ranitidine (ZANTAC) 150 MG tablet Take 150 mg by mouth daily as needed for heartburn.    . sertraline (ZOLOFT) 100 MG tablet Take  100 mg by mouth daily.     . traMADol (ULTRAM) 50 MG tablet Take 1 tablet (50 mg total) by mouth every 6 (six) hours as needed. 30 tablet 0  . valACYclovir (VALTREX) 1000 MG tablet Take 1 tablet (1,000 mg total) by mouth 3 (three) times daily. 21 tablet 0   No current facility-administered medications for this encounter.     ALLERGIES: Crestor   LABORATORY DATA:  Lab Results  Component Value Date   WBC 6.4 10/04/2014   HGB 12.6 10/04/2014   HCT 37.2 10/04/2014   MCV 90.0 10/04/2014   PLT 139* 10/04/2014   Lab Results  Component Value Date   NA 135* 10/04/2014   K 3.8 10/04/2014   CL 101 08/17/2014   CO2 25 10/04/2014   Lab Results  Component Value Date   ALT 17 10/04/2014   AST 19 10/04/2014   ALKPHOS 45 10/04/2014   BILITOT 0.26 10/04/2014     NARRATIVE: Glenda Stevens was seen today for weekly treatment management. The chart was checked and the patient's films were reviewed.  Weekly rad txs, 25/28 anal completed, diarrhea On lomotil now, took 5 yesterday, ulcers on tongue side,Poor appetite, bottom raw and peeling stated, burning, drinks broth, water tea, and eats soft eggs at time, Pain 7/10 scale but did take her pain medication. Tramadol, at 1100 this am has eased off a little, chemo  last week, fatigued.   PHYSICAL EXAMINATION: weight is 146 lb 1.6 oz (66.271 kg). Her oral temperature is 99.1 F (37.3 C). Her blood pressure is 128/64 and her pulse is 83. Her respiration is 20.        Skin: Moist desquamation in the inguinal region and posteriorly in perineal region.  ASSESSMENT: The patient is doing satisfactorily with treatment.  PLAN: We will continue with the patient's radiation treatment as planned. Gave patient Silvadene.   ------------------------------------------------  Jodelle Gross, MD, PhD  This document serves as a record of services personally performed by Kyung Rudd, MD. It was created on his behalf by Derek Mound, a trained medical scribe.  The creation of this record is based on the scribe's personal observations and the provider's statements to them. This document has been checked and approved by the attending provider.

## 2014-10-14 NOTE — Progress Notes (Signed)
Weekly rad txs,  25/28 anal completed, diarrhea  On lomotil now, took 5 yesterday,   ulcers on tongue side,   Poor appetite, bottom raw and peeling stated,burning, drinks broth, water tea, and eats soft eggs at time,  Pain 7/10 scale but did take her pain medication   Tramadol, at  1100 this am has eased off a little, chemo last week, fatigued 1:44 PM Wt Readings from Last 3 Encounters:  10/14/14 146 lb 1.6 oz (66.271 kg)  10/10/14 146 lb 8 oz (66.452 kg)  09/27/14 149 lb 4.8 oz (67.722 kg)   BP 128/64 mmHg  Pulse 83  Temp(Src) 99.1 F (37.3 C) (Oral)  Resp 20  Wt 146 lb 1.6 oz (66.271 kg)

## 2014-10-17 ENCOUNTER — Other Ambulatory Visit: Payer: Self-pay | Admitting: Hematology

## 2014-10-17 ENCOUNTER — Ambulatory Visit: Payer: Medicare Other

## 2014-10-17 ENCOUNTER — Ambulatory Visit
Admission: RE | Admit: 2014-10-17 | Discharge: 2014-10-17 | Disposition: A | Discharge status: Home / Self Care | Payer: Medicare Other | Source: Ambulatory Visit | Attending: Radiation Oncology | Admitting: Radiation Oncology

## 2014-10-17 DIAGNOSIS — C210 Malignant neoplasm of anus, unspecified: Secondary | ICD-10-CM

## 2014-10-17 DIAGNOSIS — C20 Malignant neoplasm of rectum: Secondary | ICD-10-CM | POA: Diagnosis not present

## 2014-10-17 MED ORDER — SILVER SULFADIAZINE 1 % EX CREA
TOPICAL_CREAM | Freq: Every day | CUTANEOUS | Status: DC
  Administered 2014-10-17: 14:00:00 via TOPICAL

## 2014-10-18 ENCOUNTER — Ambulatory Visit: Payer: Medicare Other

## 2014-10-18 ENCOUNTER — Other Ambulatory Visit: Payer: Self-pay | Admitting: Hematology

## 2014-10-18 DIAGNOSIS — C20 Malignant neoplasm of rectum: Secondary | ICD-10-CM | POA: Diagnosis not present

## 2014-10-19 ENCOUNTER — Telehealth: Payer: Self-pay | Admitting: *Deleted

## 2014-10-19 ENCOUNTER — Ambulatory Visit
Admission: RE | Admit: 2014-10-19 | Discharge: 2014-10-19 | Disposition: A | Discharge status: Home / Self Care | Payer: Medicare Other | Source: Ambulatory Visit | Attending: Radiation Oncology | Admitting: Radiation Oncology

## 2014-10-19 ENCOUNTER — Encounter: Payer: Self-pay | Admitting: Radiation Oncology

## 2014-10-19 ENCOUNTER — Other Ambulatory Visit: Payer: Self-pay | Admitting: *Deleted

## 2014-10-19 VITALS — BP 106/59 | HR 59 | Temp 98.5°F | Resp 20 | Wt 145.3 lb

## 2014-10-19 DIAGNOSIS — C210 Malignant neoplasm of anus, unspecified: Secondary | ICD-10-CM

## 2014-10-19 DIAGNOSIS — C20 Malignant neoplasm of rectum: Secondary | ICD-10-CM

## 2014-10-19 MED ORDER — DIPHENOXYLATE-ATROPINE 2.5-0.025 MG PO TABS: 1.0000 | ORAL_TABLET | Freq: Four times a day (QID) | ORAL | Status: DC | PRN

## 2014-10-19 MED ORDER — TRAMADOL HCL 50 MG PO TABS: 50.0000 mg | ORAL_TABLET | Freq: Four times a day (QID) | ORAL | Status: DC | PRN

## 2014-10-19 NOTE — Telephone Encounter (Signed)
ERROR

## 2014-10-19 NOTE — Progress Notes (Signed)
Weekly Management Note Current Dose: 50.4 Gy  Projected Dose: 50.4 Gy   Narrative:  The patient presents for routine under treatment assessment.  CBCT/MVCT images/Port film x-rays were reviewed.  The chart was checked. Stable, taking tramadol once a day. Needs refill on the tramadol and her lomotil. This is filled by Dr. Burr Medico. She is using silvadine on her perineum and feels her appetite is improving.  Physical Findings: Moist desquamation in the perineum, vulva, and anal area.  Vitals:  Filed Vitals:   10/19/14 1338  BP: 106/59  Pulse: 59  Temp: 98.5 F (36.9 C)  Resp: 20   Weight:  Wt Readings from Last 3 Encounters:  10/19/14 145 lb 4.8 oz (65.908 kg)  10/14/14 146 lb 1.6 oz (66.271 kg)  10/10/14 146 lb 8 oz (66.452 kg)   Lab Results  Component Value Date   WBC 6.4 10/04/2014   HGB 12.6 10/04/2014   HCT 37.2 10/04/2014   MCV 90.0 10/04/2014   PLT 139* 10/04/2014   Lab Results  Component Value Date   CREATININE 0.7 10/04/2014   BUN 10.4 10/04/2014   NA 135* 10/04/2014   K 3.8 10/04/2014   CL 101 08/17/2014   CO2 25 10/04/2014    Impression:  The patient completes RT today.   Plan: We contacted Dr. Ernestina Penna office for prescription refills. The patient has completed radiation treatment. The patient will return to radiation oncology clinic for routine followup in one month. I advised her to call or return sooner if she has any questions or concerns related to her recovery or treatment.  This document serves as a record of services personally performed by Thea Silversmith, MD. It was created on her behalf by Darcus Austin, a trained medical scribe. The creation of this record is based on the scribe's personal observations and the provider's statements to them. This document has been checked and approved by the attending provider.

## 2014-10-19 NOTE — Progress Notes (Signed)
Weekly rad txs anal, eot 28/28 completed, bowels are getting better,firmer, still taking imodium has called for lomotil rx at her pharmacy, no nausea, mucositis better with MMW, chemotherapy completed as well, mild fatigue, appetite fair getting some better, soft food and carnation instant breakfast 2-3 day 1:42 PM BP 106/59 mmHg  Pulse 59  Temp(Src) 98.5 F (36.9 C) (Oral)  Resp 20  Wt 145 lb 4.8 oz (65.908 kg)  Wt Readings from Last 3 Encounters:  10/19/14 145 lb 4.8 oz (65.908 kg)  10/14/14 146 lb 1.6 oz (66.271 kg)  10/10/14 146 lb 8 oz (66.452 kg)

## 2014-10-19 NOTE — Telephone Encounter (Signed)
Called Dr. Ernestina Penna nurse office phone and spoke with Darcey Nora, per Dr. Pablo Ledger  To have Dr. Burr Medico call in Tramadol and lomotil  rx for patient 2:10 PM

## 2014-10-20 ENCOUNTER — Telehealth: Payer: Self-pay | Admitting: Hematology

## 2014-10-20 NOTE — Telephone Encounter (Signed)
Called and left a message with 8/15 appointment

## 2014-10-24 ENCOUNTER — Ambulatory Visit (HOSPITAL_BASED_OUTPATIENT_CLINIC_OR_DEPARTMENT_OTHER): Payer: Medicare Other | Admitting: Hematology

## 2014-10-24 ENCOUNTER — Encounter: Payer: Self-pay | Admitting: Hematology

## 2014-10-24 ENCOUNTER — Telehealth: Payer: Self-pay | Admitting: Hematology

## 2014-10-24 ENCOUNTER — Other Ambulatory Visit (HOSPITAL_BASED_OUTPATIENT_CLINIC_OR_DEPARTMENT_OTHER): Payer: Medicare Other

## 2014-10-24 VITALS — BP 133/56 | HR 93 | Temp 97.9°F | Resp 18 | Ht 62.0 in | Wt 145.0 lb

## 2014-10-24 DIAGNOSIS — C20 Malignant neoplasm of rectum: Secondary | ICD-10-CM | POA: Diagnosis not present

## 2014-10-24 DIAGNOSIS — Z720 Tobacco use: Secondary | ICD-10-CM | POA: Diagnosis not present

## 2014-10-24 DIAGNOSIS — C210 Malignant neoplasm of anus, unspecified: Secondary | ICD-10-CM

## 2014-10-24 LAB — COMPREHENSIVE METABOLIC PANEL (CC13)
ALBUMIN: 3.1 g/dL — AB (ref 3.5–5.0)
ALK PHOS: 46 U/L (ref 40–150)
ALT: 18 U/L (ref 0–55)
AST: 22 U/L (ref 5–34)
Anion Gap: 8 mEq/L (ref 3–11)
BILIRUBIN TOTAL: 0.22 mg/dL (ref 0.20–1.20)
BUN: 7.3 mg/dL (ref 7.0–26.0)
CO2: 27 meq/L (ref 22–29)
CREATININE: 0.7 mg/dL (ref 0.6–1.1)
Calcium: 9.1 mg/dL (ref 8.4–10.4)
Chloride: 103 mEq/L (ref 98–109)
EGFR: 88 mL/min/{1.73_m2} — ABNORMAL LOW (ref >90)
Glucose: 111 mg/dl (ref 70–140)
Potassium: 4 mEq/L (ref 3.5–5.1)
SODIUM: 137 meq/L (ref 136–145)
TOTAL PROTEIN: 6 g/dL — AB (ref 6.4–8.3)

## 2014-10-24 LAB — CBC WITH DIFFERENTIAL/PLATELET
BASO%: 0.4 % (ref 0.0–2.0)
Basophils Absolute: 0 10*3/uL (ref 0.0–0.1)
EOS%: 1.8 % (ref 0.0–7.0)
Eosinophils Absolute: 0.1 10*3/uL (ref 0.0–0.5)
HCT: 33.9 % — ABNORMAL LOW (ref 34.8–46.6)
HGB: 11.7 g/dL (ref 11.6–15.9)
LYMPH%: 18.2 % (ref 14.0–49.7)
MCH: 30.9 pg (ref 25.1–34.0)
MCHC: 34.5 g/dL (ref 31.5–36.0)
MCV: 89.4 fL (ref 79.5–101.0)
MONO#: 0.4 10*3/uL (ref 0.1–0.9)
MONO%: 14 % (ref 0.0–14.0)
NEUT%: 65.6 % (ref 38.4–76.8)
NEUTROS ABS: 1.9 10*3/uL (ref 1.5–6.5)
NRBC: 0 % (ref 0–0)
Platelets: 72 10*3/uL — ABNORMAL LOW (ref 145–400)
RBC: 3.79 10*6/uL (ref 3.70–5.45)
RDW: 15.1 % — AB (ref 11.2–14.5)
WBC: 2.9 10*3/uL — AB (ref 3.9–10.3)
lymph#: 0.5 10*3/uL — ABNORMAL LOW (ref 0.9–3.3)

## 2014-10-24 NOTE — Progress Notes (Signed)
[facility]  [phone] Fax:(336) [facility] Note   Patient Care Team: Tonia Ghent, MD as PCP - General (Family Medicine) 10/24/2014  CHIEF COMPLAINTS:  Follow up rectal squamous cell carcinoma  Oncology History   Presented with heme + stool, loose stools for 2 months     Anal cancer   08/01/2014 Pathology Results Rectum, biopsy, distal mass - SQUAMOUS CELL CARCINOMA, BASALOID TYPE   08/01/2014 Procedure Colonoscopy-ulcerated, malignant appearing distal rectal mass adjacent to the internal anal sphincter. #14 polyps spread throughout the colon (#12 removed)   08/01/2014 Tumor Marker CEA=5.3   08/01/2014 Initial Diagnosis Rectal cancer   08/04/2014 Imaging CT C/A/P-No evidence of metastatic disease in chest, abdomen, or pelvis   08/18/2014 Procedure Colonoscopy and parotidectomy. Path reviewed tubular adenoma.   08/25/2014 Imaging PET IMPRESSION: 1. Hypermetabolic activity at the anus corresponding with known squamous cell carcinoma. No evidence of metastatic disease. 2. No other suspicious metabolic activity. 3. Diffuse atherosclerosis.    HISTORY OF PRESENTING ILLNESS:  Glenda Stevens 69 y.o. female is here because of recently diagnosed rectal squamous cell carcinoma.  She has been having diarrhea for the past 4 months, she has watery bowel movement 1-2 times daily, with some urgency, no blood, associated with abdominal cramps before the bowel movement. She denies any nausea or vomiting. She has good appetite, no weight loss. No fever or chills. She didn't seek medical attention until a month's ago she was in [place] by her primary care physician. Lab CBC and CMP was unremarkable, stool guaiac was positive. She was referred to gastroenterologist Dr. Ardis Hughs, and underwent her first colonoscopy on 08/01/2014. The colonoscopy showed a total of 14 polyps, 12 polyps were removed, and a distal rectal mass adjacent to the internal and no sphincter was  found, which was suspicious for malignancy. Biopsy showed squamous cell carcinoma. Staging scan CT showed no evidence of adenopathy or distant metastasis. She was referred to our cancer center to discuss chemotherapy and radiation. She is going to see radiation oncologist Dr. Lisbeth Renshaw this afternoon.  INTERIM HISTORY Glenda Stevens returns for follow-up. She finished her radiation on 8/10. She had worsening diarrhea, and perianal pain in the past two weeks, but she is getting better this week. She takes tramadol 1-2 times a day for the pain, Imodium and Lomotil for diarrhea. Her diarrhea is well controlled. No nausea, abdominal pain, or other symptoms. Appetite is coming back, she is eating better, although not back to normal yet. No episodes of fever, chill, or bleeding.    MEDICAL HISTORY:  Past Medical History  Diagnosis Date  . Renal artery stenosis     left stent in 2003  . Renovascular hypertension   . Hypertension   . Hyperlipidemia   . GERD (gastroesophageal reflux disease)   . Arthritis   . History of chicken pox   . Allergy     Hay fever  . Phlebitis and thrombophlebitis   . Depression   . 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 PONV (postoperative nausea and vomiting)     SURGICAL HISTORY: Past Surgical History  Procedure Laterality Date  . Transthoracic echocardiogram  06/2012    EF 55-60%, mild MR  . Nm myocar perf wall motion  09/2009    bruce myoview - normal pattern of perfusion, EF 83%, low risk scan  . Renal doppler  09/2012    left renal artery stent - 60-99% diameter reduction  .  Renal artery angioplasty  07/02/2001    6x109mm Genesis on Aviator balloon stent overlapping a 6x85mm Genesist on Aviator balloon stent (Dr. Marella Chimes)  . Nasal sinus surgery    . Abdominal hysterectomy    . Cataract extraction Bilateral   . Breast surgery      bilateral lumpectomy-benign  . Colon resection N/A 08/18/2014    Procedure: LAPAROSCOPIC ASSISTED  COLON POLYP RESECTION ,  DIAGNOSTIC LAPROSCOPY ;  Surgeon: Leighton Ruff, MD;  Location: [facility];  Service: General;  Laterality: N/A;    SOCIAL HISTORY: Social History   Social History  . Marital Status: Divorced    Spouse Name: N/A  . Number of Children: 1  . Years of Education: 16   Occupational History  . Education officer, museum    Social History Main Topics  . Smoking status: Current Every Day Smoker -- 1.00 packs/day for 30 years    Types: Cigarettes  . Smokeless tobacco: Never Used  . Alcohol Use: 4.8 - 5.4 oz/week    1-2 Glasses of wine, 7 Standard drinks or equivalent per week     Comment: daily -glass wine  . Drug Use: No  . Sexual Activity: Not on file   Other Topics Concern  . Not on file   Social History Narrative   Divorced   1 son, local who lives with her   Retired from [employer] work   Hydrologist grad   Has poodle named "Murphy"    FAMILY HISTORY: Family History  Problem Relation Age of Onset  . Hypertension Mother   . Heart disease Mother   . Dementia Mother   . Heart disease Father   . Hyperlipidemia Father   . Cancer Father     lung cancer and prostate cancer   . Colon cancer Neg Hx   . Breast cancer Cousin   . Cancer Cousin     4 paternal and 1 maternal cousins had breast cancer     ALLERGIES:  is allergic to crestor.  MEDICATIONS:  Current Outpatient Prescriptions  Medication Sig Dispense Refill  . ALPRAZolam (XANAX) 0.25 MG tablet Take 0.125 mg by mouth daily as needed for anxiety.    . Alum & Mag Hydroxide-Simeth (MAGIC MOUTHWASH) SOLN Take 5 mLs by mouth 3 (three) times daily as needed for mouth pain. Called [facility] out patient pharmacy (Patient taking differently: Take 5 mLs by mouth 3 (three) times daily as needed for mouth pain (refilled 09/13/14). Called [facility] out patient pharmacy) 300 mL 0  . atenolol (TENORMIN) 50 MG tablet Take 1 tablet (50 mg total) by mouth daily. (Patient taking differently: Take 50 mg by mouth daily after supper. ) 90 tablet 3  .  candesartan (ATACAND) 32 MG tablet Take 1 tablet (32 mg total) by mouth daily. <PLEASE MAKE APPOINTMENT FOR REFILLS> 90 tablet 0  . cetirizine (ZYRTEC ALLERGY) 10 MG tablet Take 5 mg by mouth daily as needed for allergies.    . diphenoxylate-atropine (LOMOTIL) 2.5-0.025 MG per tablet Take 1 tablet by mouth 4 (four) times daily as needed for diarrhea or loose stools. 30 tablet 0  . fluconazole (DIFLUCAN) 150 MG tablet Take 1 tablet (150 mg total) by mouth once. Repeat dose in 7 days if symptoms are still present. 2 tablet 0  . miconazole (MICOTIN) 2 % cream Apply 1 application topically as needed.    . Multiple Vitamin (MULTIVITAMIN) tablet Take 1 tablet by mouth daily.    Marland Kitchen nystatin (MYCOSTATIN) 100000 UNIT/ML suspension  Take 1,000,000 Units by mouth.    . ondansetron (ZOFRAN) 8 MG tablet Take 1 tablet (8 mg total) by mouth every 8 (eight) hours as needed for nausea or vomiting. 20 tablet 3  . ranitidine (ZANTAC) 150 MG tablet Take 150 mg by mouth daily as needed for heartburn.    . sertraline (ZOLOFT) 100 MG tablet Take 100 mg by mouth daily.     . silver sulfADIAZINE (SILVADENE) 1 % cream Apply 1 application topically daily. Apply daily after rad txs to affected skin areas    . traMADol (ULTRAM) 50 MG tablet Take 1 tablet (50 mg total) by mouth every 6 (six) hours as needed. 30 tablet 0  . valACYclovir (VALTREX) 1000 MG tablet Take 1 tablet (1,000 mg total) by mouth 3 (three) times daily. 21 tablet 0   No current facility-administered medications for this visit.    REVIEW OF SYSTEMS:   Constitutional: Denies fevers, chills or abnormal night sweats Eyes: Denies blurriness of vision, double vision or watery eyes Ears, nose, mouth, throat, and face: Denies mucositis or sore throat Respiratory: Denies cough, dyspnea or wheezes Cardiovascular: Denies palpitation, chest discomfort or lower extremity swelling Gastrointestinal:  Denies nausea, heartburn or change in bowel habits Skin: Denies  abnormal skin rashes Lymphatics: Denies new lymphadenopathy or easy bruising Neurological:Denies numbness, tingling or new weaknesses Behavioral/Psych: Mood is stable, no new changes  All other systems were reviewed with the patient and are negative.  PHYSICAL EXAMINATION: ECOG PERFORMANCE STATUS: 1 - Symptomatic but completely ambulatory  Filed Vitals:   10/24/14 0952  BP: 133/56  Pulse: 93  Temp: 97.9 F (36.6 C)  Resp: 18   Filed Weights   10/24/14 0952  Weight: 145 lb (65.772 kg)    GENERAL:alert, no distress and comfortable SKIN: skin color, texture, turgor are normal, no rashes or significant lesions EYES: normal, conjunctiva are pink and non-injected, sclera clear OROPHARYNX:no exudate, no erythema and lips, buccal mucosa, and tongue normal  NECK: supple, thyroid normal size, non-tender, without nodularity LYMPH:  no palpable lymphadenopathy in the cervical, axillary or bilaterla inguinal LUNGS: clear to auscultation and percussion with normal breathing effort HEART: regular rate & rhythm and no murmurs and no lower extremity edema ABDOMEN:abdomen soft, non-tender and normal bowel sounds. (+) diffuse skin erythema at perianlal are and groin areas, no ulcers or discharge.   Musculoskeletal:no cyanosis of digits and no clubbing  PSYCH: alert & oriented x 3 with fluent speech NEURO: no focal motor/sensory deficits  LABORATORY DATA:  CBC Latest Ref Rng 10/24/2014 10/04/2014 09/27/2014  WBC 3.9 - 10.3 10e3/uL 2.9(L) 6.4 5.0  Hemoglobin 11.6 - 15.9 g/dL 11.7 12.6 13.5  Hematocrit 34.8 - 46.6 % 33.9(L) 37.2 40.5  Platelets 145 - 400 10e3/uL 72(L) 139(L) 141(L)    CMP Latest Ref Rng 10/24/2014 10/04/2014 09/27/2014  Glucose 70 - 140 mg/dl 111 92 95  BUN 7.0 - 26.0 mg/dL 7.3 10.4 7.3  Creatinine 0.6 - 1.1 mg/dL 0.7 0.7 0.7  Sodium 136 - 145 mEq/L 137 135(L) 135(L)  Potassium 3.5 - 5.1 mEq/L 4.0 3.8 4.2  Chloride 101 - 111 mmol/L - - -  CO2 22 - 29 mEq/L 27 25 26   Calcium  8.4 - 10.4 mg/dL 9.1 8.9 9.4  Total Protein 6.4 - 8.3 g/dL 6.0(L) 6.0(L) 6.3(L)  Total Bilirubin 0.20 - 1.20 mg/dL 0.22 0.26 <0.20  Alkaline Phos 40 - 150 U/L 46 45 48  AST 5 - 34 U/L 22 19 23   ALT 0 - 55  U/L 18 17 20     PATHOLOGY REPORT: Diagnosis 08/01/2014  1. Colon, biopsy, ascending and cecum, polyp (7) - TUBULAR ADENOMAS (7). NO HIGH GRADE DYSPLASIA OR MALIGNANCY IDENTIFIED. 2. Colon, biopsy, hepatic flexure, polyp (3) - TUBULAR ADENOMAS (3). NO HIGH GRADE DYSPLASIA OR MALIGNANCY IDENTIFIED. 3. Colon, biopsy, transverse - TWO FRAGMENTS OF TUBULAR ADENOMA. NO HIGH GRADE DYSPLASIA OR MALIGNANCY IDENTIFIED. 4. Colon, biopsy, descending, sigmoid - TUBULAR ADENOMAS (3). NO HIGH GRADE DYSPLASIA OR MALIGNANCY IDENTIFIED. 5. Rectum, biopsy, distal mass - SQUAMOUS CELL CARCINOMA, BASALOID TYPE. - SEE MICROSCOPIC DESCRIPTION.  Colon, polyp(s), distal ascending 08/18/2014 - MULTIPLE FRAGMENTS OF TUBULAR ADENOMA. - NO HIGH GRADE DYSPLASIA OR MALIGNANCY. RADIOGRAPHIC STUDIES:  Ct Chest, Abdomen Pelvis W Contrast 08/04/2014   IMPRESSION: 1. No acute process or evidence of metastatic disease in the chest, abdomen, or pelvis. 2. Mild motion degradation involving the abdomen and upper pelvis. 3. Advanced atherosclerosis, including within the coronary arteries.   Electronically Signed   By: Abigail Miyamoto M.D.   On: 08/04/2014 16:42   Colonoscopy 08/01/2014 They were adjacent to eachother. They were located on both sides of a fold, sessile, slightly firm. The largest of the two (56mm) was biopsied and then the site was labled with SPOT via submucosal injection. These biopsies were in jar #3. The other 12 polyp were all sessile, located in cecum, ascending, hepatic flexure, descending and sigmoid. Two were removed with snare cautery (ascending and sigmoid), and the rest removed with cold snare. Sent in three different jars (#1 ascending, cecum), (#2hepatic flexure), (#4 descending, sigmoid). There was  also an ulcerated, malignant appearing distal rectal mass that was adjacent to the internal anal sphincter. This was non-cirumferential, 3-5cm across, friable, biopsied extensively (#5). There were 8-10 diminutive polyps spread throughout the colon that I did not remove (2-74mm each). Retroflexed views revealed no abnormalities. The time to cecum = 45min Withdrawal time = 16min The scope was withdrawn and the procedure completed.  COMPLICATIONS: There were no immediate complications.  ENDOSCOPIC IMPRESSION: Multiple sites of significant neoplasia, see above   PET 08/25/2014 IMPRESSION: 1. Hypermetabolic activity at the anus corresponding with known squamous cell carcinoma. No evidence of metastatic disease. 2. No other suspicious metabolic activity. 3. Diffuse atherosclerosis. 4. 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  COLONOSCOPY 08/18/2014 ENDOSCOPIC IMPRESSION: In the distal ascending colon, near the hepatic flexure the two sessile polyps were again located. One was 1cm, the other about 1.7cm along a fold. Neither now appeared cancerous. Previous tatoo at the site was somewhat visible. With external pressure and manipulation of the colon by Dr. Marcello Moores using laparscopic technique I was able to resect the polyps. I used saline pressure injector to lift the polyps and then piecemeal snare/cautery technique. I felt there was a small amount of remaining polyp tissue at the site and I used snare tip cautery to cauterize it. Three other small (2-60mm) clearly adenomatous polyps were removed from the right colon (not retrieved). The examination was otherwise normal  RECOMMENDATIONS: Await final pathology, likely you will need repeat colonoscopy in 1 year.   ASSESSMENT & PLAN:  69 year old Caucasian female with past medical history of hypertension, anxiety and depression, mild arthritis, who presents with watery diarrhea for 4  months.  1. Rectal squamous cell carcinoma, [id number], stage I  -I reviewed her colonoscopy findings, the rectal mass biopsy results with patient and her son in great details. -We reviewed the natural history of rectal cancer, and  staging workup. CT scans reviewed no local adenopathy or distant metastasis. she early stage rectal cancer. -She now has completed his concurrent chemoradiation, with 2 cycles of full doses of FU and mitomycin.  The goal of treatment is curative -We reviewed the surveillance plan, we'll obtain a repeated PET scan in 3 months. She will follow-up with [place] every 3-4 months with lab and exam for the first 2 years, then every 6 months for additional 3 years. -She will follow-up with Dr. Marcello Moores in a few months, and Dr. Lisbeth Renshaw in September.  2. Smoking cessation -We discussed the smoking cessation extensively today, she has tried quit smoking a few times in the past and failed. -She states she is not ready to quit smoking, when she is going through the cancer diagnosis and treatment. She'll consider after she recovers  3. Hypertension, anxiety -She'll continue follow-up with her primary care physician  4. Cytopenia -She still has moderate thrombocytopenia, and mild leukopenia from chemotherapy, ANC normal, she knows to call us if she notices any bleeding.    Plan -RTC in one month   All questions were answered. The patient knows to call the clinic with any problems, questions or concerns. I spent 25 minutes counseling the patient face to face. The total time spent in the appointment was 30 minutes and more than 50% was on counseling.     Truitt Merle, MD 10/24/2014 10:20 AM

## 2014-10-24 NOTE — Telephone Encounter (Signed)
per pof to sch pt appt-cld & spoke to pt and gave pt appt time & date °

## 2014-10-26 NOTE — Progress Notes (Signed)
  Radiation Oncology         [phone] ________________________________  Name: Glenda Stevens MRN: [id number]  Date: 10/19/2014  DOB: [birth year]  End of Treatment Note  Diagnosis: anal cancer     Indication for treatment:  curative       Radiation treatment dates:   09/05/2014 through 10/19/2014  Site/dose:   The patient was treated with a course of IMRT using a simultaneous integrated boost technique. Daily image guidance was using during the treatment. The high dose region received a total of 50.4 Gy.  Narrative: The patient tolerated radiation treatment relatively well.   The patient experienced skin irritation as expected by the end of treatment.   Plan: The patient has completed radiation treatment. The patient will return to radiation oncology clinic for routine followup in one month. I advised the patient to call or return sooner if they have any questions or concerns related to their recovery or treatment. ________________________________  Jodelle Gross, M.D., Ph.D.

## 2014-11-24 ENCOUNTER — Other Ambulatory Visit (HOSPITAL_BASED_OUTPATIENT_CLINIC_OR_DEPARTMENT_OTHER): Payer: Medicare Other

## 2014-11-24 ENCOUNTER — Encounter: Payer: Self-pay | Admitting: Hematology

## 2014-11-24 ENCOUNTER — Ambulatory Visit (HOSPITAL_BASED_OUTPATIENT_CLINIC_OR_DEPARTMENT_OTHER): Payer: Medicare Other | Admitting: Hematology

## 2014-11-24 VITALS — BP 139/70 | HR 87 | Temp 99.0°F | Resp 18 | Ht 62.0 in | Wt 147.1 lb

## 2014-11-24 DIAGNOSIS — C210 Malignant neoplasm of anus, unspecified: Secondary | ICD-10-CM

## 2014-11-24 DIAGNOSIS — C20 Malignant neoplasm of rectum: Secondary | ICD-10-CM

## 2014-11-24 LAB — COMPREHENSIVE METABOLIC PANEL (CC13)
ALBUMIN: 3 g/dL — AB (ref 3.5–5.0)
ALK PHOS: 47 U/L (ref 40–150)
ALT: 10 U/L (ref 0–55)
AST: 18 U/L (ref 5–34)
Anion Gap: 6 mEq/L (ref 3–11)
BUN: 6.2 mg/dL — ABNORMAL LOW (ref 7.0–26.0)
CALCIUM: 8.8 mg/dL (ref 8.4–10.4)
CO2: 25 mEq/L (ref 22–29)
CREATININE: 0.6 mg/dL (ref 0.6–1.1)
Chloride: 105 mEq/L (ref 98–109)
EGFR: >90 mL/min/{1.73_m2} (ref >90)
Glucose: 102 mg/dl (ref 70–140)
POTASSIUM: 4 meq/L (ref 3.5–5.1)
SODIUM: 136 meq/L (ref 136–145)
Total Bilirubin: 0.42 mg/dL (ref 0.20–1.20)
Total Protein: 5.7 g/dL — ABNORMAL LOW (ref 6.4–8.3)

## 2014-11-24 LAB — CBC WITH DIFFERENTIAL/PLATELET
BASO%: 0.4 % (ref 0.0–2.0)
Basophils Absolute: 0 10*3/uL (ref 0.0–0.1)
EOS%: 9.7 % — AB (ref 0.0–7.0)
Eosinophils Absolute: 0.5 10*3/uL (ref 0.0–0.5)
HEMATOCRIT: 36 % (ref 34.8–46.6)
HEMOGLOBIN: 12.2 g/dL (ref 11.6–15.9)
LYMPH#: 1 10*3/uL (ref 0.9–3.3)
LYMPH%: 22.3 % (ref 14.0–49.7)
MCH: 32.6 pg (ref 25.1–34.0)
MCHC: 33.9 g/dL (ref 31.5–36.0)
MCV: 96.3 fL (ref 79.5–101.0)
MONO#: 0.5 10*3/uL (ref 0.1–0.9)
MONO%: 11.5 % (ref 0.0–14.0)
NEUT%: 56.1 % (ref 38.4–76.8)
NEUTROS ABS: 2.6 10*3/uL (ref 1.5–6.5)
Platelets: 140 10*3/uL — ABNORMAL LOW (ref 145–400)
RBC: 3.74 10*6/uL (ref 3.70–5.45)
RDW: 18.3 % — AB (ref 11.2–14.5)
WBC: 4.6 10*3/uL (ref 3.9–10.3)

## 2014-11-24 NOTE — Progress Notes (Signed)
[facility]  [phone] [phone]  Clinic follow up Note   Patient Care Team: Tonia Ghent, MD as PCP - General (Family Medicine) Leighton Ruff, MD as Consulting Physician (General Surgery) Kyung Rudd, MD as Consulting Physician (Radiation Oncology) 11/24/2014  CHIEF COMPLAINTS:  Follow up rectal squamous cell carcinoma  Oncology History   Presented with heme + stool, loose stools for 2 months     Anal cancer   08/01/2014 Pathology Results Rectum, biopsy, distal mass - SQUAMOUS CELL CARCINOMA, BASALOID TYPE   08/01/2014 Procedure Colonoscopy-ulcerated, malignant appearing distal rectal mass adjacent to the internal anal sphincter. #14 polyps spread throughout the colon (#12 removed)   08/01/2014 Tumor Marker CEA=5.3   08/01/2014 Initial Diagnosis Rectal cancer   08/04/2014 Imaging CT C/A/P-No evidence of metastatic disease in chest, abdomen, or pelvis   08/18/2014 Procedure Colonoscopy and parotidectomy. Path reviewed tubular adenoma.   08/25/2014 Imaging PET IMPRESSION: 1. Hypermetabolic activity at the anus corresponding with known squamous cell carcinoma. No evidence of metastatic disease. 2. No other suspicious metabolic activity. 3. Diffuse atherosclerosis.    HISTORY OF PRESENTING ILLNESS:  Glenda Stevens 69 y.o. female is here because of recently diagnosed rectal squamous cell carcinoma.  She has been having diarrhea for the past 4 months, she has watery bowel movement 1-2 times daily, with some urgency, no blood, associated with abdominal cramps before the bowel movement. She denies any nausea or vomiting. She has good appetite, no weight loss. No fever or chills. She didn't seek medical attention until a month's ago she was in [place] by her primary care physician. Lab CBC and CMP was unremarkable, stool guaiac was positive. She was referred to gastroenterologist Dr. Ardis Hughs, and underwent her first colonoscopy on 08/01/2014. The colonoscopy showed  a total of 14 polyps, 12 polyps were removed, and a distal rectal mass adjacent to the internal and no sphincter was found, which was suspicious for malignancy. Biopsy showed squamous cell carcinoma. Staging scan CT showed no evidence of adenopathy or distant metastasis. She was referred to our cancer center to discuss chemotherapy and radiation. She is going to see radiation oncologist Dr. Lisbeth Renshaw this afternoon.  INTERIM HISTORY Glenda Stevens returns for follow-up. She has recovered well from her treatment, rectal pain resolved, diarrhea better, has one lose bowel movement a day, her appetite and eating is better, but not back to normal yet. She gained a few lbs. No fever or chills.    MEDICAL HISTORY:  Past Medical History  Diagnosis Date  . Renal artery stenosis     left stent in 2003  . Renovascular hypertension   . Hypertension   . Hyperlipidemia   . GERD (gastroesophageal reflux disease)   . Arthritis   . History of chicken pox   . Allergy     Hay fever  . Phlebitis and thrombophlebitis   . Depression   . 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 PONV (postoperative nausea and vomiting)     SURGICAL HISTORY: Past Surgical History  Procedure Laterality Date  . Transthoracic echocardiogram  06/2012    EF 55-60%, mild MR  . Nm myocar perf wall motion  09/2009    bruce myoview - normal pattern of perfusion, EF 83%, low risk scan  . Renal doppler  09/2012    left renal artery stent - 60-99% diameter reduction  . Renal artery angioplasty  07/02/2001    6x19mm Genesis on Aviator balloon stent overlapping a 6x35mm  Genesist on Aviator balloon stent (Dr. Marella Chimes)  . Nasal sinus surgery    . Abdominal hysterectomy    . Cataract extraction Bilateral   . Breast surgery      bilateral lumpectomy-benign  . Colon resection N/A 08/18/2014    Procedure: LAPAROSCOPIC ASSISTED  COLON POLYP RESECTION , DIAGNOSTIC LAPROSCOPY ;  Surgeon: Leighton Ruff, MD;  Location: [facility];  Service:  General;  Laterality: N/A;    SOCIAL HISTORY: Social History   Social History  . Marital Status: Divorced    Spouse Name: N/A  . Number of Children: 1  . Years of Education: 16   Occupational History  . Education officer, museum    Social History Main Topics  . Smoking status: Current Every Day Smoker -- 1.00 packs/day for 30 years    Types: Cigarettes  . Smokeless tobacco: Never Used  . Alcohol Use: 4.8 - 5.4 oz/week    1-2 Glasses of wine, 7 Standard drinks or equivalent per week     Comment: daily -glass wine  . Drug Use: No  . Sexual Activity: Not on file   Other Topics Concern  . Not on file   Social History Narrative   Divorced   1 son, local who lives with her   Retired from [employer] work   Hydrologist grad   Has poodle named "Murphy"    FAMILY HISTORY: Family History  Problem Relation Age of Onset  . Hypertension Mother   . Heart disease Mother   . Dementia Mother   . Heart disease Father   . Hyperlipidemia Father   . Cancer Father     lung cancer and prostate cancer   . Colon cancer Neg Hx   . Breast cancer Cousin   . Cancer Cousin     4 paternal and 1 maternal cousins had breast cancer     ALLERGIES:  is allergic to crestor.  MEDICATIONS:  Current Outpatient Prescriptions  Medication Sig Dispense Refill  . ALPRAZolam (XANAX) 0.25 MG tablet Take 0.125 mg by mouth daily as needed for anxiety.    Marland Kitchen atenolol (TENORMIN) 50 MG tablet Take 1 tablet (50 mg total) by mouth daily. (Patient taking differently: Take 50 mg by mouth daily after supper. ) 90 tablet 3  . candesartan (ATACAND) 32 MG tablet Take 1 tablet (32 mg total) by mouth daily. <PLEASE MAKE APPOINTMENT FOR REFILLS> 90 tablet 0  . cetirizine (ZYRTEC ALLERGY) 10 MG tablet Take 5 mg by mouth daily as needed for allergies.    . diphenoxylate-atropine (LOMOTIL) 2.5-0.025 MG per tablet Take 1 tablet by mouth 4 (four) times daily as needed for diarrhea or loose stools. 30 tablet 0  . Multiple Vitamin  (MULTIVITAMIN) tablet Take 1 tablet by mouth daily.    . ranitidine (ZANTAC) 150 MG tablet Take 150 mg by mouth daily as needed for heartburn.    . sertraline (ZOLOFT) 100 MG tablet Take 100 mg by mouth daily.     . Alum & Mag Hydroxide-Simeth (MAGIC MOUTHWASH) SOLN Take 5 mLs by mouth 3 (three) times daily as needed for mouth pain. Called  out patient pharmacy (Patient not taking: Reported on 11/24/2014) 300 mL 0  . miconazole (MICOTIN) 2 % cream Apply 1 application topically as needed.    . nystatin (MYCOSTATIN) 100000 UNIT/ML suspension Take 1,000,000 Units by mouth.    . ondansetron (ZOFRAN) 8 MG tablet Take 1 tablet (8 mg total) by mouth every 8 (eight) hours as needed for nausea  or vomiting. (Patient not taking: Reported on 11/24/2014) 20 tablet 3  . silver sulfADIAZINE (SILVADENE) 1 % cream Apply 1 application topically daily. Apply daily after rad txs to affected skin areas    . traMADol (ULTRAM) 50 MG tablet Take 1 tablet (50 mg total) by mouth every 6 (six) hours as needed. (Patient not taking: Reported on 11/24/2014) 30 tablet 0  . valACYclovir (VALTREX) 1000 MG tablet Take 1 tablet (1,000 mg total) by mouth 3 (three) times daily. (Patient not taking: Reported on 11/24/2014) 21 tablet 0   No current facility-administered medications for this visit.    REVIEW OF SYSTEMS:   Constitutional: Denies fevers, chills or abnormal night sweats Eyes: Denies blurriness of vision, double vision or watery eyes Ears, nose, mouth, throat, and face: Denies mucositis or sore throat Respiratory: Denies cough, dyspnea or wheezes Cardiovascular: Denies palpitation, chest discomfort or lower extremity swelling Gastrointestinal:  Denies nausea, heartburn or change in bowel habits Skin: Denies abnormal skin rashes Lymphatics: Denies new lymphadenopathy or easy bruising Neurological:Denies numbness, tingling or new weaknesses Behavioral/Psych: Mood is stable, no new changes  All other systems were  reviewed with the patient and are negative.  PHYSICAL EXAMINATION: ECOG PERFORMANCE STATUS: 1 - Symptomatic but completely ambulatory  Filed Vitals:   11/24/14 0955  BP: 139/70  Pulse: 87  Temp: 99 F (37.2 C)  Resp: 18   Filed Weights   11/24/14 0955  Weight: 147 lb 1.6 oz (66.724 kg)    GENERAL:alert, no distress and comfortable SKIN: skin color, texture, turgor are normal, no rashes or significant lesions EYES: normal, conjunctiva are pink and non-injected, sclera clear OROPHARYNX:no exudate, no erythema and lips, buccal mucosa, and tongue normal  NECK: supple, thyroid normal size, non-tender, without nodularity LYMPH:  no palpable lymphadenopathy in the cervical, axillary or bilaterla inguinal LUNGS: clear to auscultation and percussion with normal breathing effort HEART: regular rate & rhythm and no murmurs and no lower extremity edema ABDOMEN:abdomen soft, non-tender and normal bowel sounds. No significant skin erythema or ulcers at perianlal are and groin areas. Rectal exam revealed a small mass in rectal above the sphincter Musculoskeletal:no cyanosis of digits and no clubbing  PSYCH: alert & oriented x 3 with fluent speech NEURO: no focal motor/sensory deficits  LABORATORY DATA:  CBC Latest Ref Rng 11/24/2014 10/24/2014 10/04/2014  WBC 3.9 - 10.3 10e3/uL 4.6 2.9(L) 6.4  Hemoglobin 11.6 - 15.9 g/dL 12.2 11.7 12.6  Hematocrit 34.8 - 46.6 % 36.0 33.9(L) 37.2  Platelets 145 - 400 10e3/uL 140(L) 72(L) 139(L)    CMP Latest Ref Rng 11/24/2014 10/24/2014 10/04/2014  Glucose 70 - 140 mg/dl 102 111 92  BUN 7.0 - 26.0 mg/dL 6.2(L) 7.3 10.4  Creatinine 0.6 - 1.1 mg/dL 0.6 0.7 0.7  Sodium 136 - 145 mEq/L 136 137 135(L)  Potassium 3.5 - 5.1 mEq/L 4.0 4.0 3.8  Chloride 101 - 111 mmol/L - - -  CO2 22 - 29 mEq/L 25 27 25   Calcium 8.4 - 10.4 mg/dL 8.8 9.1 8.9  Total Protein 6.4 - 8.3 g/dL 5.7(L) 6.0(L) 6.0(L)  Total Bilirubin 0.20 - 1.20 mg/dL 0.42 0.22 0.26  Alkaline Phos 40 - 150  U/L 47 46 45  AST 5 - 34 U/L 18 22 19   ALT 0 - 55 U/L 10 18 17     PATHOLOGY REPORT: Diagnosis 08/01/2014  1. Colon, biopsy, ascending and cecum, polyp (7) - TUBULAR ADENOMAS (7). NO HIGH GRADE DYSPLASIA OR MALIGNANCY IDENTIFIED. 2. Colon, biopsy, hepatic flexure, polyp (3) -  TUBULAR ADENOMAS (3). NO HIGH GRADE DYSPLASIA OR MALIGNANCY IDENTIFIED. 3. Colon, biopsy, transverse - TWO FRAGMENTS OF TUBULAR ADENOMA. NO HIGH GRADE DYSPLASIA OR MALIGNANCY IDENTIFIED. 4. Colon, biopsy, descending, sigmoid - TUBULAR ADENOMAS (3). NO HIGH GRADE DYSPLASIA OR MALIGNANCY IDENTIFIED. 5. Rectum, biopsy, distal mass - SQUAMOUS CELL CARCINOMA, BASALOID TYPE. - SEE MICROSCOPIC DESCRIPTION.  Colon, polyp(s), distal ascending 08/18/2014 - MULTIPLE FRAGMENTS OF TUBULAR ADENOMA. - NO HIGH GRADE DYSPLASIA OR MALIGNANCY. RADIOGRAPHIC STUDIES:  Ct Chest, Abdomen Pelvis W Contrast 08/04/2014   IMPRESSION: 1. No acute process or evidence of metastatic disease in the chest, abdomen, or pelvis. 2. Mild motion degradation involving the abdomen and upper pelvis. 3. Advanced atherosclerosis, including within the coronary arteries.   Electronically Signed   By: Abigail Miyamoto M.D.   On: 08/04/2014 16:42   Colonoscopy 08/01/2014 They were adjacent to eachother. They were located on both sides of a fold, sessile, slightly firm. The largest of the two (62mm) was biopsied and then the site was labled with SPOT via submucosal injection. These biopsies were in jar #3. The other 12 polyp were all sessile, located in cecum, ascending, hepatic flexure, descending and sigmoid. Two were removed with snare cautery (ascending and sigmoid), and the rest removed with cold snare. Sent in three different jars (#1 ascending, cecum), (#2hepatic flexure), (#4 descending, sigmoid). There was also an ulcerated, malignant appearing distal rectal mass that was adjacent to the internal anal sphincter. This was non-cirumferential, 3-5cm across, friable,  biopsied extensively (#5). There were 8-10 diminutive polyps spread throughout the colon that I did not remove (2-70mm each). Retroflexed views revealed no abnormalities. The time to cecum = 97min Withdrawal time = 24min The scope was withdrawn and the procedure completed.  COMPLICATIONS: There were no immediate complications.  ENDOSCOPIC IMPRESSION: Multiple sites of significant neoplasia, see above   PET 08/25/2014 IMPRESSION: 1. Hypermetabolic activity at the anus corresponding with known squamous cell carcinoma. No evidence of metastatic disease. 2. No other suspicious metabolic activity. 3. Diffuse atherosclerosis. 4. 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  COLONOSCOPY 08/18/2014 ENDOSCOPIC IMPRESSION: In the distal ascending colon, near the hepatic flexure the two sessile polyps were again located. One was 1cm, the other about 1.7cm along a fold. Neither now appeared cancerous. Previous tatoo at the site was somewhat visible. With external pressure and manipulation of the colon by Dr. Marcello Moores using laparscopic technique I was able to resect the polyps. I used saline pressure injector to lift the polyps and then piecemeal snare/cautery technique. I felt there was a small amount of remaining polyp tissue at the site and I used snare tip cautery to cauterize it. Three other small (2-62mm) clearly adenomatous polyps were removed from the right colon (not retrieved). The examination was otherwise normal  RECOMMENDATIONS: Await final pathology, likely you will need repeat colonoscopy in 1 year.   ASSESSMENT & PLAN:  69 year old Caucasian female with past medical history of hypertension, anxiety and depression, mild arthritis, who presents with watery diarrhea for 4 months.  1. Rectal squamous cell carcinoma, [id number], stage I  -I reviewed her colonoscopy findings, the rectal mass biopsy results with patient and her son in  great details. -We reviewed the natural history of rectal cancer, and staging workup. CT scans reviewed no local adenopathy or distant metastasis. she early stage rectal cancer. -She now has completed his concurrent chemoradiation, with 2 cycles of full doses of FU and mitomycin.  The goal of treatment is  curative -Her rectal exam showed a small residual mass in the rectum, no inguinal lymph nodes or other positive findings. She is clinically doing very well -We discussed that it is a little too early to tell if she still has residual disease, radiation effect would last a few months.  -We'll obtain a repeated PET scan in 3 month -We reviewed the surveillance plan. She will follow-up with [place] every 3-4 months with lab and exam for the first 2 years, then every 6 months for additional 3 years, she will also have annual anoscopy and CT CAP for the first 3 years.  -She will follow-up with Dr. Marcello Moores, and Dr. Lisbeth Renshaw also.   2. Smoking cessation -We discussed the smoking cessation extensively today, she has tried quit smoking a few times in the past and failed. -She states she is not ready to quit smoking, She'll consider after she recovers  3. Hypertension, anxiety -She'll continue follow-up with her primary care physician  4. Cytopenia -Her platelet count has recovered to 140K today, and,.    Plan -RTC in 3 months with a restaging PET scan.  All questions were answered. The patient knows to call the clinic with any problems, questions or concerns. I spent 25 minutes counseling the patient face to face. The total time spent in the appointment was 30 minutes and more than 50% was on counseling.     Truitt Merle, MD 11/24/2014 10:21 AM

## 2014-11-25 ENCOUNTER — Telehealth: Payer: Self-pay | Admitting: Hematology

## 2014-11-25 NOTE — Telephone Encounter (Signed)
cld pt per pof to give appt times & dates for Dec

## 2014-11-30 ENCOUNTER — Encounter: Payer: Self-pay | Admitting: Radiation Oncology

## 2014-11-30 ENCOUNTER — Other Ambulatory Visit: Payer: Self-pay

## 2014-11-30 NOTE — Telephone Encounter (Signed)
Pt left v/m; annual exam on 03/17/2014; Dr Damita Dunnings has not prescribed Atacand before but pt discussed Dr Damita Dunnings filling med for her. Pt [facility].Please advise.

## 2014-12-01 ENCOUNTER — Ambulatory Visit
Admission: RE | Admit: 2014-12-01 | Discharge: 2014-12-01 | Disposition: A | Discharge status: Home / Self Care | Payer: Medicare Other | Source: Ambulatory Visit | Attending: Radiation Oncology | Admitting: Radiation Oncology

## 2014-12-01 ENCOUNTER — Encounter: Payer: Self-pay | Admitting: Radiation Oncology

## 2014-12-01 VITALS — BP 148/65 | HR 74 | Temp 98.4°F | Resp 20 | Ht 62.0 in | Wt 146.9 lb

## 2014-12-01 DIAGNOSIS — C210 Malignant neoplasm of anus, unspecified: Secondary | ICD-10-CM

## 2014-12-01 HISTORY — DX: Personal history of irradiation: Z92.3

## 2014-12-01 MED ORDER — CANDESARTAN CILEXETIL 32 MG PO TABS: 32.0000 mg | ORAL_TABLET | Freq: Every day | ORAL | Status: DC

## 2014-12-01 NOTE — Telephone Encounter (Signed)
Sent.  Schedule CPE for early 2017 when possible.  Thanks.

## 2014-12-01 NOTE — Progress Notes (Addendum)
Follow up rectal rad txs 09/05/14-10/19/14 Patient says she still  Has loose stools at times and still has urgency to go but it is getting better, appetite better, energy level much better ,  Says she has no pain, and rectum healed outside, last Seen Dr. Burr Medico 11/24/14, Pet scan scheduled 02/23/15 and then sees Dr. Burr Medico 03/02/15 BP 148/65 mmHg  Pulse 74  Temp(Src) 98.4 F (36.9 C) (Oral)  Resp 20  Ht 5\' 2"  (1.575 m)  Wt 146 lb 14.4 oz (66.633 kg)  BMI 26.86 kg/m2  SpO2 99%  Wt Readings from Last 3 Encounters:  12/01/14 146 lb 14.4 oz (66.633 kg)  11/24/14 147 lb 1.6 oz (66.724 kg)  10/24/14 145 lb (65.772 kg)

## 2014-12-01 NOTE — Telephone Encounter (Signed)
Left message for patient to return my call about scheduling CPE.

## 2014-12-02 ENCOUNTER — Encounter: Payer: Self-pay | Admitting: Radiation Oncology

## 2014-12-02 NOTE — Progress Notes (Signed)
Radiation Oncology         [phone] ________________________________  Name: Glenda Stevens MRN: [id number]  Date: 12/01/2014  DOB: 1945/10/16  Follow-Up Visit Note  CC: Elsie Stain, MD  Tonia Ghent, MD  Diagnosis:    Anal cancer  Interval Since Last Radiation:   The patient completed radiation treatment on 10/19/2014   Narrative:  The patient returns today for routine follow-up.    Follow up rectal rad txs 09/05/14-10/19/14 Patient says she still  Has loose stools at times and still has urgency to go but it is getting better, appetite better, energy level much better ,  Says she has no pain, and rectum healed outside, last Seen Dr. Burr Medico 11/24/14, Pet scan scheduled 02/23/15 and then sees Dr. Burr Medico 03/02/15 BP 148/65 mmHg  Pulse 74  Temp(Src) 98.4 F (36.9 C) (Oral)  Resp 20  Ht 5\' 2"  (1.575 m)  Wt 146 lb 14.4 oz (66.633 kg)  BMI 26.86 kg/m2  SpO2 99%  Wt Readings from Last 3 Encounters:  12/01/14 146 lb 14.4 oz (66.633 kg)  11/24/14 147 lb 1.6 oz (66.724 kg)  10/24/14 145 lb (65.772 kg)                                ALLERGIES:  is allergic to crestor.  Meds: Current Outpatient Prescriptions  Medication Sig Dispense Refill  . ALPRAZolam (XANAX) 0.25 MG tablet Take 0.125 mg by mouth daily as needed for anxiety.    Marland Kitchen atenolol (TENORMIN) 50 MG tablet Take 1 tablet (50 mg total) by mouth daily. (Patient taking differently: Take 50 mg by mouth daily after supper. ) 90 tablet 3  . candesartan (ATACAND) 32 MG tablet Take 1 tablet (32 mg total) by mouth daily. 90 tablet 1  . cetirizine (ZYRTEC ALLERGY) 10 MG tablet Take 5 mg by mouth daily as needed for allergies.    . diphenoxylate-atropine (LOMOTIL) 2.5-0.025 MG per tablet Take 1 tablet by mouth 4 (four) times daily as needed for diarrhea or loose stools. 30 tablet 0  . miconazole (MICOTIN) 2 % cream Apply 1 application topically as needed.    . Multiple Vitamin (MULTIVITAMIN) tablet Take 1 tablet by mouth daily.    .  ranitidine (ZANTAC) 150 MG tablet Take 150 mg by mouth daily as needed for heartburn.    . sertraline (ZOLOFT) 100 MG tablet Take 100 mg by mouth daily.     Marland Kitchen nystatin (MYCOSTATIN) 100000 UNIT/ML suspension Take 1,000,000 Units by mouth.    . ondansetron (ZOFRAN) 8 MG tablet Take 1 tablet (8 mg total) by mouth every 8 (eight) hours as needed for nausea or vomiting. (Patient not taking: Reported on 11/24/2014) 20 tablet 3  . traMADol (ULTRAM) 50 MG tablet Take 1 tablet (50 mg total) by mouth every 6 (six) hours as needed. (Patient not taking: Reported on 11/24/2014) 30 tablet 0  . valACYclovir (VALTREX) 1000 MG tablet Take 1 tablet (1,000 mg total) by mouth 3 (three) times daily. (Patient not taking: Reported on 11/24/2014) 21 tablet 0   No current facility-administered medications for this encounter.    Physical Findings: The patient is in no acute distress. Patient is alert and oriented.  height is 5\' 2"  (1.575 m) and weight is 146 lb 14.4 oz (66.633 kg). Her oral temperature is 98.4 F (36.9 C). Her blood pressure is 148/65 and her pulse is 74. Her respiration is 20 and oxygen saturation  is 99%. .   The patient's skin has healed significantly. Some radiation effect without any ongoing desquamation. On digital rectal exam, it appeared that some scarring was present without abstain show nodularity. This did not feel worrisome on exam but we will continue to follow this area.  Lab Findings: Lab Results  Component Value Date   WBC 4.6 11/24/2014   HGB 12.2 11/24/2014   HCT 36.0 11/24/2014   MCV 96.3 11/24/2014   PLT 140* 11/24/2014     Radiographic Findings: No results found.  Impression:    The patient has recovered well from radiation treatment.   Plan:  She will return to clinic in 2 months.  She is scheduled for a PET scan and follow-up with medical oncology in 3 months.   Jodelle Gross, M.D., Ph.D.

## 2014-12-06 ENCOUNTER — Telehealth: Payer: Self-pay | Admitting: *Deleted

## 2014-12-06 NOTE — Telephone Encounter (Signed)
Patient called "requesting to reschedule December F/U with Dr. Burr Medico to something after the first of the year."  Call transferred to scheduler ext [phone].   Lab is 02-23-2015, F/U on 03-02-2015.

## 2014-12-06 NOTE — Telephone Encounter (Signed)
Please schedule appt for CPE as instructed.

## 2014-12-08 ENCOUNTER — Telehealth: Payer: Self-pay | Admitting: Hematology

## 2014-12-08 NOTE — Telephone Encounter (Signed)
pt cld left voicemail wanting to r/s appt in Dec-cld pt back and left voicemail to call us back to r/s appt in Dec

## 2015-01-13 ENCOUNTER — Ambulatory Visit (INDEPENDENT_AMBULATORY_CARE_PROVIDER_SITE_OTHER): Payer: Medicare Other

## 2015-01-13 DIAGNOSIS — Z23 Encounter for immunization: Secondary | ICD-10-CM

## 2015-01-25 ENCOUNTER — Telehealth: Payer: Self-pay | Admitting: *Deleted

## 2015-01-25 NOTE — Telephone Encounter (Signed)
Called patient cell phone and left message for her to call Earleen Newport to reschedule her appt tomorrow to 5pm or change to this Friday , waiting on patient to call back .10:15 AM

## 2015-01-26 ENCOUNTER — Ambulatory Visit: Admission: RE | Admit: 2015-01-26 | Payer: Medicare Other | Source: Ambulatory Visit | Admitting: Radiation Oncology

## 2015-01-27 ENCOUNTER — Ambulatory Visit
Admission: RE | Admit: 2015-01-27 | Discharge: 2015-01-27 | Disposition: A | Discharge status: Home / Self Care | Payer: Medicare Other | Source: Ambulatory Visit | Attending: Radiation Oncology | Admitting: Radiation Oncology

## 2015-01-27 ENCOUNTER — Encounter: Payer: Self-pay | Admitting: Radiation Oncology

## 2015-01-27 VITALS — BP 160/68 | HR 75 | Temp 98.1°F | Resp 20 | Wt 142.8 lb

## 2015-01-27 DIAGNOSIS — C210 Malignant neoplasm of anus, unspecified: Secondary | ICD-10-CM

## 2015-01-27 NOTE — Progress Notes (Signed)
Follow up s/p radiation to anal cancer,  Is having regular bowel movements only taking 1 imodium a day , no nausea, appetite better even walking the dog daily, no pain 1:29 PM Wt Readings from Last 3 Encounters:  12/01/14 146 lb 14.4 oz (66.633 kg)  11/24/14 147 lb 1.6 oz (66.724 kg)  10/24/14 145 lb (65.772 kg)

## 2015-01-27 NOTE — Progress Notes (Signed)
Radiation Oncology         [phone] ________________________________  Name: Glenda Stevens MRN: [id number]  Date: 01/27/2015  DOB: May 28, 1945  Follow-Up Visit Note  CC: Elsie Stain, MD  Tonia Ghent, MD  Diagnosis:    Anal cancer  Interval Since Last Radiation:   The patient completed radiation treatment on 10/19/2014   Narrative:  The patient returns today for routine follow-up. She is having regular bowel movements starting this week and takes 1 imodium a day. She denies nausea. Her appetite is better and she is walking the dog daily. She denies pain and bleeding. She mentions that she has some pain when she is going to the bathroom, but it goes away. She saw her surgeon about six weeks ago and had a scan with a good report.  Follow up rectal rad txs 09/05/14-10/19/14  BP 160/68 mmHg  Pulse 75  Temp(Src) 98.1 F (36.7 C) (Oral)  Resp 20  Wt 142 lb 12.8 oz (64.774 kg)  Wt Readings from Last 3 Encounters:  01/27/15 142 lb 12.8 oz (64.774 kg)  12/01/14 146 lb 14.4 oz (66.633 kg)  11/24/14 147 lb 1.6 oz (66.724 kg)                                ALLERGIES:  is allergic to crestor.  Meds: Current Outpatient Prescriptions  Medication Sig Dispense Refill  . ALPRAZolam (XANAX) 0.25 MG tablet Take 0.125 mg by mouth daily as needed for anxiety.    Marland Kitchen atenolol (TENORMIN) 50 MG tablet Take 1 tablet (50 mg total) by mouth daily. (Patient taking differently: Take 50 mg by mouth daily after supper. ) 90 tablet 3  . candesartan (ATACAND) 32 MG tablet Take 1 tablet (32 mg total) by mouth daily. 90 tablet 1  . cetirizine (ZYRTEC ALLERGY) 10 MG tablet Take 5 mg by mouth daily as needed for allergies.    . diphenoxylate-atropine (LOMOTIL) 2.5-0.025 MG per tablet Take 1 tablet by mouth 4 (four) times daily as needed for diarrhea or loose stools. 30 tablet 0  . miconazole (MICOTIN) 2 % cream Apply 1 application topically as needed.    . Multiple Vitamin (MULTIVITAMIN) tablet Take 1 tablet  by mouth daily.    Marland Kitchen nystatin (MYCOSTATIN) 100000 UNIT/ML suspension Take 1,000,000 Units by mouth.    . ondansetron (ZOFRAN) 8 MG tablet Take 1 tablet (8 mg total) by mouth every 8 (eight) hours as needed for nausea or vomiting. (Patient not taking: Reported on 11/24/2014) 20 tablet 3  . ranitidine (ZANTAC) 150 MG tablet Take 150 mg by mouth daily as needed for heartburn.    . sertraline (ZOLOFT) 100 MG tablet Take 100 mg by mouth daily.     . traMADol (ULTRAM) 50 MG tablet Take 1 tablet (50 mg total) by mouth every 6 (six) hours as needed. (Patient not taking: Reported on 11/24/2014) 30 tablet 0  . valACYclovir (VALTREX) 1000 MG tablet Take 1 tablet (1,000 mg total) by mouth 3 (three) times daily. (Patient not taking: Reported on 11/24/2014) 21 tablet 0   No current facility-administered medications for this encounter.    Physical Findings: The patient is in no acute distress. Patient is alert and oriented.  weight is 142 lb 12.8 oz (64.774 kg). Her oral temperature is 98.1 F (36.7 C). Her blood pressure is 160/68 and her pulse is 75. Her respiration is 20. Marland Kitchen    No inguinal lymphadenopathy.  Skin has healed well in anal region. No suspicious masses or findings on digital rectal exam. No bleeding.  Lab Findings: Lab Results  Component Value Date   WBC 4.6 11/24/2014   HGB 12.2 11/24/2014   HCT 36.0 11/24/2014   MCV 96.3 11/24/2014   PLT 140* 11/24/2014    Radiographic Findings: No results found.  Impression: The patient has recovered well from radiation treatment.   Plan: The patient has a PET scan on 02/23/2015 and has a follow up appointment with Dr. Burr Medico on 03/02/2015. She will follow up with me in four months.  Jodelle Gross, M.D., Ph.D.     This document serves as a record of services personally performed by Kyung Rudd, MD. It was created on his behalf by  Lendon Collar, a trained medical scribe. The creation of this record is based on the scribe's personal observations  and the provider's statements to them. This document has been checked and approved by the attending provider.

## 2015-02-17 ENCOUNTER — Other Ambulatory Visit: Payer: Medicare Other

## 2015-02-23 ENCOUNTER — Other Ambulatory Visit (HOSPITAL_BASED_OUTPATIENT_CLINIC_OR_DEPARTMENT_OTHER): Payer: Medicare Other

## 2015-02-23 ENCOUNTER — Ambulatory Visit (HOSPITAL_COMMUNITY)
Admission: RE | Admit: 2015-02-23 | Discharge: 2015-02-23 | Disposition: A | Discharge status: Home / Self Care | Payer: Medicare Other | Source: Ambulatory Visit | Attending: Hematology | Admitting: Hematology

## 2015-02-23 DIAGNOSIS — C210 Malignant neoplasm of anus, unspecified: Secondary | ICD-10-CM | POA: Diagnosis not present

## 2015-02-23 DIAGNOSIS — I700 Atherosclerosis of aorta: Secondary | ICD-10-CM | POA: Insufficient documentation

## 2015-02-23 DIAGNOSIS — C20 Malignant neoplasm of rectum: Secondary | ICD-10-CM | POA: Diagnosis not present

## 2015-02-23 DIAGNOSIS — I2510 Atherosclerotic heart disease of native coronary artery without angina pectoris: Secondary | ICD-10-CM | POA: Diagnosis not present

## 2015-02-23 LAB — CBC WITH DIFFERENTIAL/PLATELET
BASO%: 0.5 % (ref 0.0–2.0)
Basophils Absolute: 0 10*3/uL (ref 0.0–0.1)
EOS ABS: 0.2 10*3/uL (ref 0.0–0.5)
EOS%: 2.7 % (ref 0.0–7.0)
HEMATOCRIT: 43.9 % (ref 34.8–46.6)
HGB: 14.8 g/dL (ref 11.6–15.9)
LYMPH%: 14.6 % (ref 14.0–49.7)
MCH: 31.9 pg (ref 25.1–34.0)
MCHC: 33.7 g/dL (ref 31.5–36.0)
MCV: 94.6 fL (ref 79.5–101.0)
MONO#: 0.6 10*3/uL (ref 0.1–0.9)
MONO%: 10.4 % (ref 0.0–14.0)
NEUT%: 71.8 % (ref 38.4–76.8)
NEUTROS ABS: 3.9 10*3/uL (ref 1.5–6.5)
Platelets: 157 10*3/uL (ref 145–400)
RBC: 4.64 10*6/uL (ref 3.70–5.45)
RDW: 13 % (ref 11.2–14.5)
WBC: 5.5 10*3/uL (ref 3.9–10.3)
lymph#: 0.8 10*3/uL — ABNORMAL LOW (ref 0.9–3.3)

## 2015-02-23 LAB — COMPREHENSIVE METABOLIC PANEL
ALBUMIN: 3.6 g/dL (ref 3.5–5.0)
ALK PHOS: 50 U/L (ref 40–150)
ALT: 14 U/L (ref 0–55)
ANION GAP: 6 meq/L (ref 3–11)
AST: 21 U/L (ref 5–34)
BILIRUBIN TOTAL: 0.32 mg/dL (ref 0.20–1.20)
BUN: 12.8 mg/dL (ref 7.0–26.0)
CALCIUM: 9.2 mg/dL (ref 8.4–10.4)
CO2: 26 meq/L (ref 22–29)
Chloride: 105 mEq/L (ref 98–109)
Creatinine: 0.8 mg/dL (ref 0.6–1.1)
EGFR: 79 mL/min/{1.73_m2} — ABNORMAL LOW (ref >90)
Glucose: 89 mg/dl (ref 70–140)
Potassium: 4.4 mEq/L (ref 3.5–5.1)
Sodium: 138 mEq/L (ref 136–145)
TOTAL PROTEIN: 6.6 g/dL (ref 6.4–8.3)

## 2015-02-23 LAB — GLUCOSE, CAPILLARY: Glucose-Capillary: 106 mg/dL — ABNORMAL HIGH (ref 65–99)

## 2015-02-23 MED ORDER — FLUDEOXYGLUCOSE F - 18 (FDG) INJECTION
7.0100 | Freq: Once | INTRAVENOUS | Status: AC | PRN
  Administered 2015-02-23: 7.01 via INTRAVENOUS

## 2015-02-24 ENCOUNTER — Ambulatory Visit: Payer: Medicare Other | Admitting: Hematology

## 2015-03-02 ENCOUNTER — Encounter: Payer: Self-pay | Admitting: Hematology

## 2015-03-02 ENCOUNTER — Telehealth: Payer: Self-pay | Admitting: Hematology

## 2015-03-02 ENCOUNTER — Ambulatory Visit (HOSPITAL_BASED_OUTPATIENT_CLINIC_OR_DEPARTMENT_OTHER): Payer: Medicare Other | Admitting: Hematology

## 2015-03-02 VITALS — BP 165/75 | HR 85 | Temp 97.5°F | Resp 18 | Ht 62.0 in | Wt 142.7 lb

## 2015-03-02 DIAGNOSIS — C20 Malignant neoplasm of rectum: Secondary | ICD-10-CM | POA: Diagnosis not present

## 2015-03-02 DIAGNOSIS — Z720 Tobacco use: Secondary | ICD-10-CM

## 2015-03-02 NOTE — Telephone Encounter (Signed)
GAVE PATIENT AVS REPORT AND APPOINTMENTS FOR April. °

## 2015-03-02 NOTE — Progress Notes (Signed)
[facility]  [phone] [phone]  Clinic follow up Note   Patient Care Team: Tonia Ghent, MD as PCP - General (Family Medicine) Leighton Ruff, MD as Consulting Physician (General Surgery) Kyung Rudd, MD as Consulting Physician (Radiation Oncology) 03/02/2015  CHIEF COMPLAINTS:  Follow up rectal squamous cell carcinoma  Oncology History   Presented with heme + stool, loose stools for 2 months     Rectal cancer ([facility])   08/01/2014 Pathology Results Rectum, biopsy, distal mass - SQUAMOUS CELL CARCINOMA, BASALOID TYPE   08/01/2014 Procedure Colonoscopy-ulcerated, malignant appearing distal rectal mass adjacent to the internal anal sphincter. #14 polyps spread throughout the colon (#12 removed)   08/01/2014 Tumor Marker CEA=5.3   08/01/2014 Initial Diagnosis Rectal cancer   08/04/2014 Imaging CT C/A/P-No evidence of metastatic disease in chest, abdomen, or pelvis   08/18/2014 Procedure Colonoscopy and parotidectomy. Path reviewed tubular adenoma.   08/25/2014 Imaging PET IMPRESSION: 1. Hypermetabolic activity at the anus corresponding with known squamous cell carcinoma. No evidence of metastatic disease. 2. No other suspicious metabolic activity. 3. Diffuse atherosclerosis.   09/05/2014 - 10/19/2014 Radiation Therapy definitive XRT to 50.4 Gy with concurrent chemotherapy    HISTORY OF PRESENTING ILLNESS:  Glenda Stevens 69 y.o. female is here because of recently diagnosed rectal squamous cell carcinoma.  She has been having diarrhea for the past 4 months, she has watery bowel movement 1-2 times daily, with some urgency, no blood, associated with abdominal cramps before the bowel movement. She denies any nausea or vomiting. She has good appetite, no weight loss. No fever or chills. She didn't seek medical attention until a month's ago she was in [place] by her primary care physician. Lab CBC and CMP was unremarkable, stool guaiac was positive. She was referred  to gastroenterologist Dr. Ardis Hughs, and underwent her first colonoscopy on 08/01/2014. The colonoscopy showed a total of 14 polyps, 12 polyps were removed, and a distal rectal mass adjacent to the internal and no sphincter was found, which was suspicious for malignancy. Biopsy showed squamous cell carcinoma. Staging scan CT showed no evidence of adenopathy or distant metastasis. She was referred to our cancer center to discuss chemotherapy and radiation. She is going to see radiation oncologist Dr. Lisbeth Renshaw this afternoon.  CURRENT THERAPY: observation   INTERIM HISTORY [name] returns for follow-up. She is accompanied by her son to the clinic today. She is doing very well, has recovered well from her chemotherapy and the radiation treatment. She still has moderate fatigue, but able to tolerate routine activity without any difficulty. Her appetite is moderate, she eats small meals, weight is stable. No other new complaints. Her bowel movements normal, we'll had diarrhea a few times, no hematochezia or rectal pain.  MEDICAL HISTORY:  Past Medical History  Diagnosis Date  . Renal artery stenosis ([facility])     left stent in 2003  . Renovascular hypertension   . Hypertension   . Hyperlipidemia   . GERD (gastroesophageal reflux disease)   . Arthritis   . History of chicken pox   . Allergy     Hay fever  . Phlebitis and thrombophlebitis   . Depression   . Colon cancer ([facility]) 08/01/14    retal squamous cell ca  . PONV (postoperative nausea and vomiting)   . S/P radiation therapy 09/05/14-10/19/14    anal ca,50.4Gy    SURGICAL HISTORY: Past Surgical History  Procedure Laterality Date  . Transthoracic echocardiogram  06/2012    EF 55-60%, mild  MR  . Nm myocar perf wall motion  09/2009    bruce myoview - normal pattern of perfusion, EF 83%, low risk scan  . Renal doppler  09/2012    left renal artery stent - 60-99% diameter reduction  . Renal artery angioplasty  07/02/2001    6x73mm Genesis on Aviator  balloon stent overlapping a 6x13mm Genesist on Aviator balloon stent (Dr. Marella Chimes)  . Nasal sinus surgery    . Abdominal hysterectomy    . Cataract extraction Bilateral   . Breast surgery      bilateral lumpectomy-benign  . Colon resection N/A 08/18/2014    Procedure: LAPAROSCOPIC ASSISTED  COLON POLYP RESECTION , DIAGNOSTIC LAPROSCOPY ;  Surgeon: Leighton Ruff, MD;  Location: [facility];  Service: General;  Laterality: N/A;    SOCIAL HISTORY: Social History   Social History  . Marital Status: Divorced    Spouse Name: N/A  . Number of Children: 1  . Years of Education: 16   Occupational History  . Education officer, museum    Social History Main Topics  . Smoking status: Current Every Day Smoker -- 1.00 packs/day for 30 years    Types: Cigarettes  . Smokeless tobacco: Never Used  . Alcohol Use: 4.8 - 5.4 oz/week    1-2 Glasses of wine, 7 Standard drinks or equivalent per week     Comment: daily -glass wine  . Drug Use: No  . Sexual Activity: Not on file   Other Topics Concern  . Not on file   Social History Narrative   Divorced   1 son, local who lives with her   Retired from [employer] work   Hydrologist grad   Has poodle named "Murphy"    FAMILY HISTORY: Family History  Problem Relation Age of Onset  . Hypertension Mother   . Heart disease Mother   . Dementia Mother   . Heart disease Father   . Hyperlipidemia Father   . Cancer Father     lung cancer and prostate cancer   . Colon cancer Neg Hx   . Breast cancer Cousin   . Cancer Cousin     4 paternal and 1 maternal cousins had breast cancer     ALLERGIES:  is allergic to crestor.  MEDICATIONS:  Current Outpatient Prescriptions  Medication Sig Dispense Refill  . ALPRAZolam (XANAX) 0.25 MG tablet Take 0.125 mg by mouth daily as needed for anxiety.    Marland Kitchen atenolol (TENORMIN) 50 MG tablet Take 1 tablet (50 mg total) by mouth daily. (Patient taking differently: Take 50 mg by mouth daily after supper. ) 90 tablet 3  .  candesartan (ATACAND) 32 MG tablet Take 1 tablet (32 mg total) by mouth daily. 90 tablet 1  . cetirizine (ZYRTEC ALLERGY) 10 MG tablet Take 5 mg by mouth daily as needed for allergies.    Marland Kitchen loperamide (IMODIUM) 2 MG capsule Take by mouth as needed for diarrhea or loose stools.    . Multiple Vitamin (MULTIVITAMIN) tablet Take 1 tablet by mouth daily.    . ranitidine (ZANTAC) 150 MG tablet Take 150 mg by mouth daily as needed for heartburn.    . sertraline (ZOLOFT) 100 MG tablet Take 100 mg by mouth daily.      No current facility-administered medications for this visit.    REVIEW OF SYSTEMS:   Constitutional: Denies fevers, chills or abnormal night sweats Eyes: Denies blurriness of vision, double vision or watery eyes Ears, nose, mouth, throat, and face: Denies  mucositis or sore throat Respiratory: Denies cough, dyspnea or wheezes Cardiovascular: Denies palpitation, chest discomfort or lower extremity swelling Gastrointestinal:  Denies nausea, heartburn or change in bowel habits Skin: Denies abnormal skin rashes Lymphatics: Denies new lymphadenopathy or easy bruising Neurological:Denies numbness, tingling or new weaknesses Behavioral/Psych: Mood is stable, no new changes  All other systems were reviewed with the patient and are negative.  PHYSICAL EXAMINATION: ECOG PERFORMANCE STATUS: 1 - Symptomatic but completely ambulatory  Filed Vitals:   03/02/15 1309  BP: 165/75  Pulse: 85  Temp: 97.5 F (36.4 C)  Resp: 18   Filed Weights   03/02/15 1309  Weight: 142 lb 11.2 oz (64.728 kg)    GENERAL:alert, no distress and comfortable SKIN: skin color, texture, turgor are normal, no rashes or significant lesions EYES: normal, conjunctiva are pink and non-injected, sclera clear OROPHARYNX:no exudate, no erythema and lips, buccal mucosa, and tongue normal  NECK: supple, thyroid normal size, non-tender, without nodularity LYMPH:  no palpable lymphadenopathy in the cervical, axillary or  bilaterla inguinal LUNGS: clear to auscultation and percussion with normal breathing effort HEART: regular rate & rhythm and no murmurs and no lower extremity edema ABDOMEN:abdomen soft, non-tender and normal bowel sounds. No significant skin erythema or ulcers at perianlal are and groin areas. Rectal exam was negative for mass or blood  Musculoskeletal:no cyanosis of digits and no clubbing  PSYCH: alert & oriented x 3 with fluent speech NEURO: no focal motor/sensory deficits  LABORATORY DATA:  CBC Latest Ref Rng 02/23/2015 11/24/2014 10/24/2014  WBC 3.9 - 10.3 10e3/uL 5.5 4.6 2.9(L)  Hemoglobin 11.6 - 15.9 g/dL 14.8 12.2 11.7  Hematocrit 34.8 - 46.6 % 43.9 36.0 33.9(L)  Platelets 145 - 400 10e3/uL 157 140(L) 72(L)    CMP Latest Ref Rng 02/23/2015 11/24/2014 10/24/2014  Glucose 70 - 140 mg/dl 89 102 111  BUN 7.0 - 26.0 mg/dL 12.8 6.2(L) 7.3  Creatinine 0.6 - 1.1 mg/dL 0.8 0.6 0.7  Sodium 136 - 145 mEq/L 138 136 137  Potassium 3.5 - 5.1 mEq/L 4.4 4.0 4.0  Chloride 101 - 111 mmol/L - - -  CO2 22 - 29 mEq/L 26 25 27   Calcium 8.4 - 10.4 mg/dL 9.2 8.8 9.1  Total Protein 6.4 - 8.3 g/dL 6.6 5.7(L) 6.0(L)  Total Bilirubin 0.20 - 1.20 mg/dL 0.32 0.42 0.22  Alkaline Phos 40 - 150 U/L 50 47 46  AST 5 - 34 U/L 21 18 22   ALT 0 - 55 U/L 14 10 18     PATHOLOGY REPORT: Diagnosis 08/01/2014  1. Colon, biopsy, ascending and cecum, polyp (7) - TUBULAR ADENOMAS (7). NO HIGH GRADE DYSPLASIA OR MALIGNANCY IDENTIFIED. 2. Colon, biopsy, hepatic flexure, polyp (3) - TUBULAR ADENOMAS (3). NO HIGH GRADE DYSPLASIA OR MALIGNANCY IDENTIFIED. 3. Colon, biopsy, transverse - TWO FRAGMENTS OF TUBULAR ADENOMA. NO HIGH GRADE DYSPLASIA OR MALIGNANCY IDENTIFIED. 4. Colon, biopsy, descending, sigmoid - TUBULAR ADENOMAS (3). NO HIGH GRADE DYSPLASIA OR MALIGNANCY IDENTIFIED. 5. Rectum, biopsy, distal mass - SQUAMOUS CELL CARCINOMA, BASALOID TYPE. - SEE MICROSCOPIC DESCRIPTION.  Colon, polyp(s), distal ascending  08/18/2014 - MULTIPLE FRAGMENTS OF TUBULAR ADENOMA. - NO HIGH GRADE DYSPLASIA OR MALIGNANCY.  RADIOGRAPHIC STUDIES:  PET 02/23/2015 IMPRESSION: 1. Interval response to therapy (SUV 4.49. Previously 7.9.). 2. Decrease in FDG uptake associated with anal tumor. 3. No new or progressive disease identified within the chest, abdomen or pelvis.   Colonoscopy 08/01/2014 They were adjacent to eachother. They were located on both sides of a fold, sessile, slightly firm.  The largest of the two (51mm) was biopsied and then the site was labled with SPOT via submucosal injection. These biopsies were in jar #3. The other 12 polyp were all sessile, located in cecum, ascending, hepatic flexure, descending and sigmoid. Two were removed with snare cautery (ascending and sigmoid), and the rest removed with cold snare. Sent in three different jars (#1 ascending, cecum), (#2hepatic flexure), (#4 descending, sigmoid). There was also an ulcerated, malignant appearing distal rectal mass that was adjacent to the internal anal sphincter. This was non-cirumferential, 3-5cm across, friable, biopsied extensively (#5). There were 8-10 diminutive polyps spread throughout the colon that I did not remove (2-31mm each). Retroflexed views revealed no abnormalities. The time to cecum = 71min Withdrawal time = 84min The scope was withdrawn and the procedure completed.  COMPLICATIONS: There were no immediate complications.  ENDOSCOPIC IMPRESSION: Multiple sites of significant neoplasia, see above   PET 08/25/2014 IMPRESSION: 1. Hypermetabolic activity at the anus corresponding with known squamous cell carcinoma. No evidence of metastatic disease. 2. No other suspicious metabolic activity. 3. Diffuse atherosclerosis. 4. 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  COLONOSCOPY 08/18/2014 ENDOSCOPIC IMPRESSION: In the distal ascending colon, near the hepatic  flexure the two sessile polyps were again located. One was 1cm, the other about 1.7cm along a fold. Neither now appeared cancerous. Previous tatoo at the site was somewhat visible. With external pressure and manipulation of the colon by Dr. Marcello Moores using laparscopic technique I was able to resect the polyps. I used saline pressure injector to lift the polyps and then piecemeal snare/cautery technique. I felt there was a small amount of remaining polyp tissue at the site and I used snare tip cautery to cauterize it. Three other small (2-20mm) clearly adenomatous polyps were removed from the right colon (not retrieved). The examination was otherwise normal  RECOMMENDATIONS: Await final pathology, likely you will need repeat colonoscopy in 1 year.   ASSESSMENT & PLAN:  69 year old Caucasian female with past medical history of hypertension, anxiety and depression, mild arthritis, who presents with watery diarrhea for 4 months.  1. Rectal squamous cell carcinoma, [id number], stage I  -I reviewed her colonoscopy findings, the rectal mass biopsy results with patient and her son in great details. -We reviewed the natural history of rectal cancer, and staging workup. CT scans reviewed no local adenopathy or distant metastasis. she early stage rectal cancer. -She completed his concurrent chemoradiation, with 2 cycles of full doses of FU and mitomycin.  The goal of treatment is curative -Her rectal exam today showed no residual mass in the rectum, no inguinal lymph nodes or other positive findings. She is clinically doing very well I reviewed her restaging PET scan findings, which showed interval response and decreased FDG uptake in the and no mass. No other evidence of recurrence. We discussed that the residual hypermetabolic activity is possible related to her radiation, less likely residual tumor. -Dr. Marcello Moores saw her a few months ago and anoscopy was negative   - we'll continue surveillance.  2. Smoking  cessation -I strongly encouraged her to stop smoking, she is willing to cut back.  3. Hypertension, anxiety -She'll continue follow-up with her primary care physician   Plan -RTC in 4 months with lab and exam  -She will follow-up with Dr. Lisbeth Renshaw in 1 months and Dr. Marcello Moores in 3 months   All questions were answered. The patient knows to call the clinic with any problems, questions or concerns.  I spent 25 minutes counseling the patient face to face. The total time spent in the appointment was 30 minutes and more than 50% was on counseling.     Truitt Merle, MD 03/02/2015 4:04 PM

## 2015-05-24 ENCOUNTER — Ambulatory Visit
Admission: RE | Admit: 2015-05-24 | Discharge: 2015-05-24 | Disposition: A | Discharge status: Home / Self Care | Payer: Medicare Other | Source: Ambulatory Visit | Attending: Radiation Oncology | Admitting: Radiation Oncology

## 2015-05-24 ENCOUNTER — Encounter: Payer: Self-pay | Admitting: Radiation Oncology

## 2015-05-24 ENCOUNTER — Telehealth: Payer: Self-pay | Admitting: *Deleted

## 2015-05-24 VITALS — BP 198/100 | HR 72 | Temp 97.9°F | Resp 20 | Ht 62.0 in | Wt 144.5 lb

## 2015-05-24 DIAGNOSIS — C20 Malignant neoplasm of rectum: Secondary | ICD-10-CM

## 2015-05-24 DIAGNOSIS — C210 Malignant neoplasm of anus, unspecified: Secondary | ICD-10-CM

## 2015-05-24 NOTE — Progress Notes (Addendum)
Follow up anal rad txs 10/19/14 completed,no pain, no nausea,  Takes 1 imodium daily  And has a bowel movment daily, just tired and legs get tired easily, sob with exertion,  100% room air,  Sees Dr. Burr Medico   06/29/15, Surgeon next month.  BP 198/100 mmHg  Pulse 72  Temp(Src) 97.9 F (36.6 C) (Oral)  Resp 20  Ht 5\' 2"  (1.575 m)  Wt 144 lb 8 oz (65.545 kg)  BMI 26.42 kg/m2  SpO2 100%  Wt Readings from Last 3 Encounters:  05/24/15 144 lb 8 oz (65.545 kg)  03/02/15 142 lb 11.2 oz (64.728 kg)  01/27/15 142 lb 12.8 oz (64.774 kg)   1:40 PM

## 2015-05-24 NOTE — Progress Notes (Signed)
Radiation Oncology         [phone] ________________________________  Name: Glenda Stevens MRN: [id number]  Date: 05/24/2015  DOB: 1945-06-18  Follow-Up Visit Note  CC: Glenda Stain, MD  Tonia Ghent, MD  Diagnosis: Squamous cell carcinoma of the rectum  Interval Since Last Radiation: 7 months  09/05/2014 through 10/19/2014: The patient was treated with a course of IMRT using a simultaneous integrated boost technique. Daily image guidance was using during the treatment. The high dose region received a total of 50.4 Gy.  Narrative:  The patient returns today for routine follow-up.  The patient had a PET scan on 02/23/15 showed interval response to therapy, FDG uptake at the anus has decreased with the SUV max now 4.49 from 7.9, and no new metastases were noted. The patient saw Dr. Marcello Moores following this who discussed these findings. She was also seen by Dr. Burr Medico on 04/12/2015 at which time she clinically without evidence of disease.  Review of Symptoms: On review of systems, the patient reports that she is doing very well overall. She continues to use Imodium daily for loose bowel movements but states that she typically does this out of habit rather than because she is still having diarrhea. She denies any abdominal pain, nausea or vomiting. She denies any skin irritation of the rectum her palpable lesion. She is not experiencing any chest pain, shortness of breath, fevers or chills. A complete review of systems is obtained and is otherwise negative.  ALLERGIES:  is allergic to crestor.  Meds: Current Outpatient Prescriptions  Medication Sig Dispense Refill  . ALPRAZolam (XANAX) 0.25 MG tablet Take 0.125 mg by mouth daily as needed for anxiety.    Marland Kitchen atenolol (TENORMIN) 50 MG tablet Take 1 tablet (50 mg total) by mouth daily. (Patient taking differently: Take 50 mg by mouth daily after supper. ) 90 tablet 3  . candesartan (ATACAND) 32 MG tablet Take 1 tablet (32 mg total) by mouth  daily. 90 tablet 1  . cetirizine (ZYRTEC ALLERGY) 10 MG tablet Take 5 mg by mouth daily as needed for allergies.    Marland Kitchen loperamide (IMODIUM) 2 MG capsule Take by mouth as needed for diarrhea or loose stools.    . Multiple Vitamin (MULTIVITAMIN) tablet Take 1 tablet by mouth daily.    . ranitidine (ZANTAC) 150 MG tablet Take 150 mg by mouth daily as needed for heartburn.    . sertraline (ZOLOFT) 100 MG tablet Take 100 mg by mouth daily.      No current facility-administered medications for this encounter.    Physical Findings:  height is 5\' 2"  (1.575 m) and weight is 144 lb 8 oz (65.545 kg). Her oral temperature is 97.9 F (36.6 C). Her blood pressure is 198/100 and her pulse is 72. Her respiration is 20 and oxygen saturation is 100%.    In general this is a well appearing Caucasian woman in no acute distress. She is alert and oriented x4 and appropriate throughout the examination. HEENT reveals that the patient is normocephalic, atraumatic. EOMs are intact. PERRLA. Skin is intact without any evidence of gross lesions. Cardiovascular exam reveals a regular rate and rhythm, no clicks rubs or murmurs are auscultated. Chest is clear to auscultation bilaterally. Lymphatic assessment is performed and does not reveal any adenopathy in the cervical, supraclavicular, axillary, or inguinal chains. Abdomen has active bowel sounds in all quadrants and is intact. The abdomen is soft, non tender, non distended. Lower extremities are negative for pretibial pitting edema,  deep calf tenderness, cyanosis or clubbing. Pelvic exam reveals normal appearing external female genitalia, labial, perineal, and perianal tissue without gross lesion. Speculum exam reveals a well healed vaginal cuff without visible lesion. Bimanual exam reveals minimal scaring best palpable at the proximal third of the vagina on the left. No palpable masses are identified. The ovaries are not palpable bilaterally. On rectovaginal exam, there is no  induration or nodularity of the septum. No palpable thickening is noted of the rectal mucosa.  Lab Findings: Lab Results  Component Value Date   WBC 5.5 02/23/2015   HGB 14.8 02/23/2015   HCT 43.9 02/23/2015   MCV 94.6 02/23/2015   PLT 157 02/23/2015    Radiographic Findings: No results found.  Impression/Plan: 1. Squamous cell carcinoma of the rectum. The patient appears to be clinically without disease. We discussed the rationale for continued surveillance, and would recommend that the patient return in approximately 6 months time that she is seeing medical oncology and surgery as well. She is encouraged to call if she has any questions or concerns prior to that visit. 2. Breast cancer screening. During the evaluation the patient did ask about to have a mammogram. I've advised that she consider having this performed. She states that she will contact the imaging center to coordinate this.  The above documentation reflects my direct findings during this shared patient visit. Please see the separate note by Dr. Lisbeth Renshaw on this date for the remainder of the patient's plan of care.    Carola Rhine, PAC   This document serves as a record of services personally performed by Shona Simpson, PAC and Kyung Rudd, MD. It was created on their behalf by Darcus Austin, a trained medical scribe. The creation of this record is based on the scribe's personal observations and the provider's statements to them. This document has been checked and approved by the attending provider.

## 2015-05-24 NOTE — Telephone Encounter (Signed)
CALLED PATIENT TO INFORM THAT HER FU VISIT HAS BEEN CHANGED TO 10-31-15 @ 1:15 PM WITH ALISON PERKINS, LVM FOR A RETURN CALL

## 2015-06-01 ENCOUNTER — Encounter: Payer: Self-pay | Admitting: Family Medicine

## 2015-06-11 ENCOUNTER — Telehealth: Payer: Self-pay | Admitting: Family Medicine

## 2015-06-11 NOTE — Telephone Encounter (Signed)
Can I get some clarification for this patient? She was originally going to proceed with CT chest for lung cancer screening back in 2016, but then was found to have rectal cancer with subsequent neg PET (that imaged the chest) done in 02/2015. She didn't enter the CT lung cancer protocol.   Am I to presume that it still makes sense NOT to proceed with lung cancer screening protocol, given her mult relatively recent imaging? Thanks.

## 2015-06-15 NOTE — Telephone Encounter (Signed)
I would agree. With recent imaging, I wouldn't do any screening for now. As she gets further out, in the absence of further chest imaging, that could be revisited in ie. 6-12 months.

## 2015-06-16 NOTE — Telephone Encounter (Signed)
Please see below and notify pt.  I was checking up on the possible lung cancer screening.  She had other issues in the meantime that took precedence.   At this point, it makes sense NOT to proceed with lung cancer screening protocol, given her mult relatively recent imaging.   We can reconsider screening in about 1 year.  Thanks.

## 2015-06-16 NOTE — Telephone Encounter (Signed)
Left message on patient's voicemail to return call

## 2015-06-19 NOTE — Telephone Encounter (Signed)
Patient returned [name] call. °

## 2015-06-19 NOTE — Telephone Encounter (Signed)
Left message on patient's voicemail to return call on home and cell phone.

## 2015-06-19 NOTE — Telephone Encounter (Signed)
Patient advised.

## 2015-06-26 ENCOUNTER — Other Ambulatory Visit: Payer: Self-pay | Admitting: Family Medicine

## 2015-06-29 ENCOUNTER — Encounter: Payer: Self-pay | Admitting: Hematology

## 2015-06-29 ENCOUNTER — Other Ambulatory Visit (HOSPITAL_BASED_OUTPATIENT_CLINIC_OR_DEPARTMENT_OTHER): Payer: Medicare Other

## 2015-06-29 ENCOUNTER — Ambulatory Visit (HOSPITAL_BASED_OUTPATIENT_CLINIC_OR_DEPARTMENT_OTHER): Payer: Medicare Other | Admitting: Hematology

## 2015-06-29 ENCOUNTER — Telehealth: Payer: Self-pay | Admitting: Hematology

## 2015-06-29 VITALS — BP 170/77 | HR 73 | Temp 98.2°F | Resp 18 | Ht 62.0 in | Wt 144.0 lb

## 2015-06-29 DIAGNOSIS — C20 Malignant neoplasm of rectum: Secondary | ICD-10-CM | POA: Diagnosis not present

## 2015-06-29 DIAGNOSIS — Z720 Tobacco use: Secondary | ICD-10-CM

## 2015-06-29 LAB — COMPREHENSIVE METABOLIC PANEL
ALBUMIN: 3.6 g/dL (ref 3.5–5.0)
ALK PHOS: 56 U/L (ref 40–150)
ALT: 12 U/L (ref 0–55)
ANION GAP: 7 meq/L (ref 3–11)
AST: 20 U/L (ref 5–34)
BILIRUBIN TOTAL: 0.38 mg/dL (ref 0.20–1.20)
BUN: 10.1 mg/dL (ref 7.0–26.0)
CO2: 29 meq/L (ref 22–29)
CREATININE: 0.8 mg/dL (ref 0.6–1.1)
Calcium: 9.4 mg/dL (ref 8.4–10.4)
Chloride: 101 mEq/L (ref 98–109)
EGFR: 77 mL/min/{1.73_m2} — ABNORMAL LOW (ref >90)
GLUCOSE: 108 mg/dL (ref 70–140)
Potassium: 4.2 mEq/L (ref 3.5–5.1)
SODIUM: 137 meq/L (ref 136–145)
TOTAL PROTEIN: 6.6 g/dL (ref 6.4–8.3)

## 2015-06-29 LAB — CBC WITH DIFFERENTIAL/PLATELET
BASO%: 0.4 % (ref 0.0–2.0)
BASOS ABS: 0 10*3/uL (ref 0.0–0.1)
EOS ABS: 0.1 10*3/uL (ref 0.0–0.5)
EOS%: 1.7 % (ref 0.0–7.0)
HCT: 41.3 % (ref 34.8–46.6)
HGB: 14.2 g/dL (ref 11.6–15.9)
LYMPH%: 15.2 % (ref 14.0–49.7)
MCH: 32.6 pg (ref 25.1–34.0)
MCHC: 34.4 g/dL (ref 31.5–36.0)
MCV: 94.7 fL (ref 79.5–101.0)
MONO#: 0.5 10*3/uL (ref 0.1–0.9)
MONO%: 8.8 % (ref 0.0–14.0)
NEUT#: 4 10*3/uL (ref 1.5–6.5)
NEUT%: 73.9 % (ref 38.4–76.8)
Platelets: 162 10*3/uL (ref 145–400)
RBC: 4.36 10*6/uL (ref 3.70–5.45)
RDW: 13.1 % (ref 11.2–14.5)
WBC: 5.3 10*3/uL (ref 3.9–10.3)
lymph#: 0.8 10*3/uL — ABNORMAL LOW (ref 0.9–3.3)

## 2015-06-29 NOTE — Progress Notes (Signed)
[facility]  [phone] [phone]  Clinic follow up Note   Patient Care Team: Tonia Ghent, MD as PCP - General (Family Medicine) Leighton Ruff, MD as Consulting Physician (General Surgery) Kyung Rudd, MD as Consulting Physician (Radiation Oncology) 06/29/2015  CHIEF COMPLAINTS:  Follow up rectal squamous cell carcinoma  Oncology History   Presented with heme + stool, loose stools for 2 months     Rectal cancer ([place])   08/01/2014 Pathology Results Rectum, biopsy, distal mass - SQUAMOUS CELL CARCINOMA, BASALOID TYPE   08/01/2014 Procedure Colonoscopy-ulcerated, malignant appearing distal rectal mass adjacent to the internal anal sphincter. #14 polyps spread throughout the colon (#12 removed)   08/01/2014 Tumor Marker CEA=5.3   08/01/2014 Initial Diagnosis Rectal cancer   08/04/2014 Imaging CT C/A/P-No evidence of metastatic disease in chest, abdomen, or pelvis   08/18/2014 Procedure Colonoscopy and parotidectomy. Path reviewed tubular adenoma.   08/25/2014 Imaging PET IMPRESSION: 1. Hypermetabolic activity at the anus corresponding with known squamous cell carcinoma. No evidence of metastatic disease. 2. No other suspicious metabolic activity. 3. Diffuse atherosclerosis.   09/05/2014 - 10/19/2014 Radiation Therapy definitive XRT to 50.4 Gy with concurrent chemotherapy    HISTORY OF PRESENTING ILLNESS:  Glenda Stevens 70 y.o. female is here because of recently diagnosed rectal squamous cell carcinoma.  She has been having diarrhea for the past 4 months, she has watery bowel movement 1-2 times daily, with some urgency, no blood, associated with abdominal cramps before the bowel movement. She denies any nausea or vomiting. She has good appetite, no weight loss. No fever or chills. She didn't seek medical attention until a month's ago she was in [place] by her primary care physician. Lab CBC and CMP was unremarkable, stool guaiac was positive. She was referred  to gastroenterologist Dr. Ardis Hughs, and underwent her first colonoscopy on 08/01/2014. The colonoscopy showed a total of 14 polyps, 12 polyps were removed, and a distal rectal mass adjacent to the internal and no sphincter was found, which was suspicious for malignancy. Biopsy showed squamous cell carcinoma. Staging scan CT showed no evidence of adenopathy or distant metastasis. She was referred to our cancer center to discuss chemotherapy and radiation. She is going to see radiation oncologist Dr. Lisbeth Renshaw this afternoon.  CURRENT THERAPY: observation   INTERIM HISTORY Glenda Stevens returns for follow-up. She came to our clinic by herself today. She has been doing very well lately, denies any pain, change of her habitus, or other symptoms. She remains to be physically active, she walks her dog every day, does housework and shopping by herself, and lives independently. Her only complaint is that she feels her legs are very tired after she walks for a while, and she needs to take a break. No particular weakness, numbness or other neurological symptoms. Her weight is stable. She saw Dr. Marcello Moores and Dr. Lisbeth Renshaw lately.   MEDICAL HISTORY:  Past Medical History  Diagnosis Date  . Renal artery stenosis ([facility])     left stent in 2003  . Renovascular hypertension   . Hypertension   . Hyperlipidemia   . GERD (gastroesophageal reflux disease)   . Arthritis   . History of chicken pox   . Allergy     Hay fever  . Phlebitis and thrombophlebitis   . Depression   . Colon cancer ([place]) 08/01/14    retal squamous cell ca  . PONV (postoperative nausea and vomiting)   . S/P radiation therapy 09/05/14-10/19/14    anal ca,50.4Gy  SURGICAL HISTORY: Past Surgical History  Procedure Laterality Date  . Transthoracic echocardiogram  06/2012    EF 55-60%, mild MR  . Nm myocar perf wall motion  09/2009    bruce myoview - normal pattern of perfusion, EF 83%, low risk scan  . Renal doppler  09/2012    left renal artery stent -  60-99% diameter reduction  . Renal artery angioplasty  07/02/2001    6x65mm Genesis on Aviator balloon stent overlapping a 6x38mm Genesist on Aviator balloon stent (Dr. Marella Chimes)  . Nasal sinus surgery    . Abdominal hysterectomy    . Cataract extraction Bilateral   . Breast surgery      bilateral lumpectomy-benign  . Colon resection N/A 08/18/2014    Procedure: LAPAROSCOPIC ASSISTED  COLON POLYP RESECTION , DIAGNOSTIC LAPROSCOPY ;  Surgeon: Leighton Ruff, MD;  Location: [facility];  Service: General;  Laterality: N/A;    SOCIAL HISTORY: Social History   Social History  . Marital Status: Divorced    Spouse Name: N/A  . Number of Children: 1  . Years of Education: 16   Occupational History  . Education officer, museum    Social History Main Topics  . Smoking status: Current Every Day Smoker -- 1.00 packs/day for 30 years    Types: Cigarettes  . Smokeless tobacco: Never Used  . Alcohol Use: 4.8 - 5.4 oz/week    1-2 Glasses of wine, 7 Standard drinks or equivalent per week     Comment: daily -glass wine  . Drug Use: No  . Sexual Activity: Not on file   Other Topics Concern  . Not on file   Social History Narrative   Divorced   1 son, local who lives with her   Retired from [employer] work   Hydrologist grad   Has poodle named "Murphy"    FAMILY HISTORY: Family History  Problem Relation Age of Onset  . Hypertension Mother   . Heart disease Mother   . Dementia Mother   . Heart disease Father   . Hyperlipidemia Father   . Cancer Father     lung cancer and prostate cancer   . Colon cancer Neg Hx   . Breast cancer Cousin   . Cancer Cousin     4 paternal and 1 maternal cousins had breast cancer     ALLERGIES:  is allergic to crestor.  MEDICATIONS:  Current Outpatient Prescriptions  Medication Sig Dispense Refill  . ALPRAZolam (XANAX) 0.25 MG tablet Take 0.125 mg by mouth daily as needed for anxiety.    Marland Kitchen atenolol (TENORMIN) 50 MG tablet Take 1 tablet (50 mg total) by mouth daily.  (Patient taking differently: Take 50 mg by mouth daily after supper. ) 90 tablet 3  . candesartan (ATACAND) 32 MG tablet Take 1 tablet by mouth  daily 90 tablet 0  . cetirizine (ZYRTEC ALLERGY) 10 MG tablet Take 5 mg by mouth daily as needed for allergies.    Marland Kitchen loperamide (IMODIUM) 2 MG capsule Take 0.5 mg by mouth as needed for diarrhea or loose stools. States taking 1/2 tab daily-afraid to stop    . Multiple Vitamin (MULTIVITAMIN) tablet Take 1 tablet by mouth daily.    . ranitidine (ZANTAC) 150 MG tablet Take 150 mg by mouth daily as needed for heartburn.    . sertraline (ZOLOFT) 100 MG tablet Take 100 mg by mouth daily.      No current facility-administered medications for this visit.    REVIEW OF  SYSTEMS:   Constitutional: Denies fevers, chills or abnormal night sweats Eyes: Denies blurriness of vision, double vision or watery eyes Ears, nose, mouth, throat, and face: Denies mucositis or sore throat Respiratory: Denies cough, dyspnea or wheezes Cardiovascular: Denies palpitation, chest discomfort or lower extremity swelling Gastrointestinal:  Denies nausea, heartburn or change in bowel habits Skin: Denies abnormal skin rashes Lymphatics: Denies new lymphadenopathy or easy bruising Neurological:Denies numbness, tingling or new weaknesses Behavioral/Psych: Mood is stable, no new changes  All other systems were reviewed with the patient and are negative.  PHYSICAL EXAMINATION: ECOG PERFORMANCE STATUS: 1 - Symptomatic but completely ambulatory  Filed Vitals:   06/29/15 1408  BP: 170/77  Pulse: 73  Temp: 98.2 F (36.8 C)  Resp: 18   Filed Weights   06/29/15 1408  Weight: 144 lb (65.318 kg)    GENERAL:alert, no distress and comfortable SKIN: skin color, texture, turgor are normal, no rashes or significant lesions EYES: normal, conjunctiva are pink and non-injected, sclera clear OROPHARYNX:no exudate, no erythema and lips, buccal mucosa, and tongue normal  NECK: supple,  thyroid normal size, non-tender, without nodularity LYMPH:  no palpable lymphadenopathy in the cervical, axillary or bilaterla inguinal LUNGS: clear to auscultation and percussion with normal breathing effort HEART: regular rate & rhythm and no murmurs and no lower extremity edema ABDOMEN:abdomen soft, non-tender and normal bowel sounds. She declined rectal exam today since she was recently examed Musculoskeletal:no cyanosis of digits and no clubbing  PSYCH: alert & oriented x 3 with fluent speech NEURO: no focal motor/sensory deficits  LABORATORY DATA:  CBC Latest Ref Rng 06/29/2015 02/23/2015 11/24/2014  WBC 3.9 - 10.3 10e3/uL 5.3 5.5 4.6  Hemoglobin 11.6 - 15.9 g/dL 14.2 14.8 12.2  Hematocrit 34.8 - 46.6 % 41.3 43.9 36.0  Platelets 145 - 400 10e3/uL 162 157 140(L)    CMP Latest Ref Rng 06/29/2015 02/23/2015 11/24/2014  Glucose 70 - 140 mg/dl 108 89 102  BUN 7.0 - 26.0 mg/dL 10.1 12.8 6.2(L)  Creatinine 0.6 - 1.1 mg/dL 0.8 0.8 0.6  Sodium 136 - 145 mEq/L 137 138 136  Potassium 3.5 - 5.1 mEq/L 4.2 4.4 4.0  CO2 22 - 29 mEq/L 29 26 25   Calcium 8.4 - 10.4 mg/dL 9.4 9.2 8.8  Total Protein 6.4 - 8.3 g/dL 6.6 6.6 5.7(L)  Total Bilirubin 0.20 - 1.20 mg/dL 0.38 0.32 0.42  Alkaline Phos 40 - 150 U/L 56 50 47  AST 5 - 34 U/L 20 21 18   ALT 0 - 55 U/L 12 14 10     PATHOLOGY REPORT: Diagnosis 08/01/2014  1. Colon, biopsy, ascending and cecum, polyp (7) - TUBULAR ADENOMAS (7). NO HIGH GRADE DYSPLASIA OR MALIGNANCY IDENTIFIED. 2. Colon, biopsy, hepatic flexure, polyp (3) - TUBULAR ADENOMAS (3). NO HIGH GRADE DYSPLASIA OR MALIGNANCY IDENTIFIED. 3. Colon, biopsy, transverse - TWO FRAGMENTS OF TUBULAR ADENOMA. NO HIGH GRADE DYSPLASIA OR MALIGNANCY IDENTIFIED. 4. Colon, biopsy, descending, sigmoid - TUBULAR ADENOMAS (3). NO HIGH GRADE DYSPLASIA OR MALIGNANCY IDENTIFIED. 5. Rectum, biopsy, distal mass - SQUAMOUS CELL CARCINOMA, BASALOID TYPE. - SEE MICROSCOPIC DESCRIPTION.  Colon, polyp(s),  distal ascending 08/18/2014 - MULTIPLE FRAGMENTS OF TUBULAR ADENOMA. - NO HIGH GRADE DYSPLASIA OR MALIGNANCY.  RADIOGRAPHIC STUDIES:  PET 02/23/2015 IMPRESSION: 1. Interval response to therapy (SUV 4.49. Previously 7.9.). 2. Decrease in FDG uptake associated with anal tumor. 3. No new or progressive disease identified within the chest, abdomen or pelvis.   Colonoscopy 08/01/2014 They were adjacent to eachother. They were located on both sides  of a fold, sessile, slightly firm. The largest of the two (39mm) was biopsied and then the site was labled with SPOT via submucosal injection. These biopsies were in jar #3. The other 12 polyp were all sessile, located in cecum, ascending, hepatic flexure, descending and sigmoid. Two were removed with snare cautery (ascending and sigmoid), and the rest removed with cold snare. Sent in three different jars (#1 ascending, cecum), (#2hepatic flexure), (#4 descending, sigmoid). There was also an ulcerated, malignant appearing distal rectal mass that was adjacent to the internal anal sphincter. This was non-cirumferential, 3-5cm across, friable, biopsied extensively (#5). There were 8-10 diminutive polyps spread throughout the colon that I did not remove (2-41mm each). Retroflexed views revealed no abnormalities. The time to cecum = 20min Withdrawal time = 47min The scope was withdrawn and the procedure completed.  COMPLICATIONS: There were no immediate complications.  ENDOSCOPIC IMPRESSION: Multiple sites of significant neoplasia, see above   PET 02/23/2015 IMPRESSION: 1. Interval response to therapy. 2. Decrease in FDG uptake associated with anal tumor. 3. No new or progressive disease identified within the chest, abdomen or pelvis.   COLONOSCOPY 08/18/2014 ENDOSCOPIC IMPRESSION: In the distal ascending colon, near the hepatic flexure the two sessile polyps were again located. One was 1cm, the other about 1.7cm along a fold. Neither now appeared  cancerous. Previous tatoo at the site was somewhat visible. With external pressure and manipulation of the colon by Dr. Marcello Moores using laparscopic technique I was able to resect the polyps. I used saline pressure injector to lift the polyps and then piecemeal snare/cautery technique. I felt there was a small amount of remaining polyp tissue at the site and I used snare tip cautery to cauterize it. Three other small (2-72mm) clearly adenomatous polyps were removed from the right colon (not retrieved). The examination was otherwise normal  RECOMMENDATIONS: Await final pathology, likely you will need repeat colonoscopy in 1 year.   ASSESSMENT & PLAN:  70 year old Caucasian female with past medical history of hypertension, anxiety and depression, mild arthritis, who presents with watery diarrhea for 4 months.  1. Rectal squamous cell carcinoma, [id number], stage I  -I reviewed her colonoscopy findings, the rectal mass biopsy results with patient and her son in great details. -We reviewed the natural history of rectal cancer, and staging workup. CT scans reviewed no local adenopathy or distant metastasis. she early stage rectal cancer. -She completed her concurrent chemoradiation, with 2 cycles of full doses of FU and mitomycin. The goal of treatment is curative  -I reviewed her restaging PET scan from 02/2015, which showed interval response and decreased FDG uptake in the and no mass. No other evidence of recurrence. We discussed that the residual hypermetabolic activity is possible related to her radiation, less likely residual tumor. -she was seen by Dr. Marcello Moores recently and anoscopy was negative for residual tumor --we'll continue cancer surveillance. Her exam was unremarkable today, labs CBC and CMP are within normal limits. No evidence of recurrence.  2. Smoking cessation -I strongly encouraged her to stop smoking, she is willing to cut back.  3. Hypertension, anxiety -She'll continue follow-up with  her primary care physician   Plan -RTC in 4 months with lab and exam, plan to repeat PET or CT in 02/2016    All questions were answered. The patient knows to call the clinic with any problems, questions or concerns.  I spent 25 minutes counseling the patient face to face. The total time spent in the appointment was 30 minutes and  more than 50% was on counseling.     Truitt Merle, MD 06/29/2015 2:30 PM

## 2015-06-29 NOTE — Telephone Encounter (Signed)
per pof to sch pt appt-gave pt copy of avs °

## 2015-07-03 ENCOUNTER — Encounter: Payer: Self-pay | Admitting: Family Medicine

## 2015-07-03 ENCOUNTER — Ambulatory Visit (INDEPENDENT_AMBULATORY_CARE_PROVIDER_SITE_OTHER): Payer: Medicare Other | Admitting: Family Medicine

## 2015-07-03 VITALS — BP 166/78 | HR 72 | Temp 98.7°F | Ht 63.0 in | Wt 144.0 lb

## 2015-07-03 DIAGNOSIS — Z720 Tobacco use: Secondary | ICD-10-CM | POA: Diagnosis not present

## 2015-07-03 DIAGNOSIS — C20 Malignant neoplasm of rectum: Secondary | ICD-10-CM

## 2015-07-03 DIAGNOSIS — F419 Anxiety disorder, unspecified: Secondary | ICD-10-CM | POA: Diagnosis not present

## 2015-07-03 DIAGNOSIS — Z0000 Encounter for general adult medical examination without abnormal findings: Secondary | ICD-10-CM | POA: Diagnosis not present

## 2015-07-03 DIAGNOSIS — I10 Essential (primary) hypertension: Secondary | ICD-10-CM | POA: Diagnosis not present

## 2015-07-03 DIAGNOSIS — F17200 Nicotine dependence, unspecified, uncomplicated: Secondary | ICD-10-CM

## 2015-07-03 DIAGNOSIS — Z119 Encounter for screening for infectious and parasitic diseases, unspecified: Secondary | ICD-10-CM

## 2015-07-03 MED ORDER — VARENICLINE TARTRATE 0.5 MG X 11 & 1 MG X 42 PO MISC: ORAL | Status: DC

## 2015-07-03 MED ORDER — LOPERAMIDE HCL 2 MG PO CAPS: ORAL_CAPSULE | ORAL | Status: DC

## 2015-07-03 MED ORDER — CANDESARTAN CILEXETIL 32 MG PO TABS
ORAL_TABLET | ORAL | Status: DC
Start: 2015-07-03 — End: 2016-05-13

## 2015-07-03 MED ORDER — VARENICLINE TARTRATE 1 MG PO TABS: 1.0000 mg | ORAL_TABLET | Freq: Two times a day (BID) | ORAL | Status: DC

## 2015-07-03 MED ORDER — ATENOLOL 50 MG PO TABS: 50.0000 mg | ORAL_TABLET | Freq: Every day | ORAL | Status: DC

## 2015-07-03 NOTE — Patient Instructions (Addendum)
Check with your insurance and the cancer clinic about the shingles shot. Check with your insurance to see if they will cover the tetanus shot. Start the chantix and update me as needed.  People usually stop smoking about 1 week into treatment.  If you have mood changes or intolerable dreams then stop the medicine.  Take care.  Glad to see you.  I would get a flu shot each fall.

## 2015-07-03 NOTE — Progress Notes (Signed)
Pre visit review using our clinic review tool, if applicable. No additional management support is needed unless otherwise documented below in the visit note.  I have personally reviewed the Medicare Annual Wellness questionnaire and have noted 1. The patient's medical and social history 2. Their use of alcohol, tobacco or illicit drugs 3. Their current medications and supplements 4. The patient's functional ability including ADL's, fall risks, home safety risks and hearing or visual             impairment. 5. Diet and physical activities 6. Evidence for depression or mood disorders  The patients weight, height, BMI have been recorded in the chart and visual acuity is per eye clinic.  I have made referrals, counseling and provided education to the patient based review of the above and I have provided the pt with a written personalized care plan for preventive services.  Provider list updated- see scanned forms.  Routine anticipatory guidance given to patient.  See health maintenance.  Flu 2016 Shingles d/w pt PNA up to date Tetanus d/w pt.  Colonoscopy 2016 Breast cancer screening pending DXA declined, deferred by patient.  Advance directive- son designated if patient were incapacitated.   Cognitive function addressed- see scanned forms- and if abnormal then additional documentation follows.  Pt opts in for HCV screening with next set of labs.  D/w pt re: routine screening.  We can draw lipids concurrently.   Smoking cessation d/w pt.  She wants to try chantix, d/w pt about routine use, dosing and side effects esp dreams and mood changes.  If ADE, then she'll stop med and update me.   Hypertension:    Using medication without problems or lightheadedness: yes Chest pain with exertion:no Edema:no Short of breath: only as expected with her smoking hx, not excessive o/w.  Prev labs d/w pt.   Mood is stable on med w/o ADE on med.    H/o colon cancer s/p chemo/rady/surgery.  She had  sig mouth sores on chemo, resolved now.  She has had routine f/u in the meantime.  I appreciate the help of all involved in her care.    PMH and SH reviewed  Meds, vitals, and allergies reviewed.   ROS: See HPI.  Otherwise negative.    GEN: nad, alert and oriented HEENT: mucous membranes moist NECK: supple w/o LA CV: rrr. PULM: ctab, no inc wob ABD: soft, +bs EXT: no edema SKIN: no acute rash

## 2015-07-06 DIAGNOSIS — F419 Anxiety disorder, unspecified: Secondary | ICD-10-CM | POA: Insufficient documentation

## 2015-07-06 DIAGNOSIS — Z0000 Encounter for general adult medical examination without abnormal findings: Secondary | ICD-10-CM | POA: Insufficient documentation

## 2015-07-06 NOTE — Assessment & Plan Note (Signed)
See above re: routine instructions re: chantix, she'll update me as needed.  I encouraged her with her efforts for cessation.

## 2015-07-06 NOTE — Assessment & Plan Note (Signed)
Controlled, no ADE on med.  No SI/HI.  Okay for outpatient f/u.  She agrees.  No reason to change meds at this point.

## 2015-07-06 NOTE — Assessment & Plan Note (Signed)
bp has been controlled out of clinic.  Likely white coat effect.  Would continue as is.  Return for lipids later on.  She agrees.

## 2015-07-06 NOTE — Assessment & Plan Note (Signed)
Flu 2016 Shingles d/w pt PNA up to date Tetanus d/w pt.  Colonoscopy 2016 Breast cancer screening pending DXA declined, deferred by patient.  Advance directive- son designated if patient were incapacitated.   Cognitive function addressed- see scanned forms- and if abnormal then additional documentation follows.  Pt opts in for HCV screening with next set of labs.  D/w pt re: routine screening.  We can draw lipids concurrently.

## 2015-07-06 NOTE — Assessment & Plan Note (Signed)
S/p chemo/rady/surgery. She had sig mouth sores on chemo, resolved now. She has had routine f/u in the meantime. I appreciate the help of all involved in her care.  Still with some occ looser stools with prn imodium use, no blood in stools and feeling much better overall.

## 2015-07-31 ENCOUNTER — Encounter: Payer: Self-pay | Admitting: Family Medicine

## 2015-08-24 ENCOUNTER — Encounter: Payer: Self-pay | Admitting: Gastroenterology

## 2015-08-30 ENCOUNTER — Encounter: Payer: Self-pay | Admitting: Hematology

## 2015-09-09 ENCOUNTER — Other Ambulatory Visit: Payer: Self-pay | Admitting: Family Medicine

## 2015-09-15 ENCOUNTER — Encounter: Payer: Self-pay | Admitting: Gastroenterology

## 2015-10-24 ENCOUNTER — Ambulatory Visit
Admission: RE | Admit: 2015-10-24 | Discharge: 2015-10-24 | Disposition: A | Discharge status: Home / Self Care | Payer: Medicare Other | Source: Ambulatory Visit | Attending: Radiation Oncology | Admitting: Radiation Oncology

## 2015-10-24 ENCOUNTER — Encounter: Payer: Self-pay | Admitting: Radiation Oncology

## 2015-10-24 VITALS — BP 170/70 | HR 70 | Temp 98.4°F | Resp 18 | Ht 63.0 in | Wt 143.6 lb

## 2015-10-24 DIAGNOSIS — C20 Malignant neoplasm of rectum: Secondary | ICD-10-CM | POA: Insufficient documentation

## 2015-10-24 DIAGNOSIS — I10 Essential (primary) hypertension: Secondary | ICD-10-CM | POA: Insufficient documentation

## 2015-10-24 DIAGNOSIS — M79604 Pain in right leg: Secondary | ICD-10-CM | POA: Diagnosis not present

## 2015-10-24 DIAGNOSIS — M79605 Pain in left leg: Secondary | ICD-10-CM | POA: Insufficient documentation

## 2015-10-24 NOTE — Progress Notes (Signed)
Glenda Stevens is here for a 5 month visit for rectal cancer. T She is taking 1 imodium daily  and has a bowel movment daily.  No irritation to rectum.  Having fatigue and legs get tired easily, sob with exertion,  100% room air.  Appetite is good.  Denies pain. Wt Readings from Last 3 Encounters:  10/24/15 143 lb 9.6 oz (65.1 kg)  07/03/15 144 lb (65.3 kg)  06/29/15 144 lb (65.3 kg)  BP (!) 170/70 (BP Location: Right Arm, Patient Position: Sitting, Cuff Size: Normal)   Pulse 70   Temp 98.4 F (36.9 C) (Oral)   Resp 18   Ht 5\' 3"  (1.6 m)   Wt 143 lb 9.6 oz (65.1 kg)   SpO2 100%   BMI 25.44 kg/m

## 2015-10-26 NOTE — Progress Notes (Signed)
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 Daily image guidance was using during the treatment. The high dose region received a total of 50.4 Gy.  Narrative:  The patient returns today for routine follow-up.  The patient's history has been summarized previously, but she comes today after definitive radiation and concurrent chemotherapy which she completed about 12 months ago. Her most recent PET this spring revealed improvement in disease. She will have  Repeat imaging in December. She has been NED clinically and reports a colonoscopy in spring 2016, and will see Dr. Marcello Moores for sigmoidoscopy in November 2017.  Review of Symptoms: On review of systems, the patient reports that she is doing well overall. She denies any chest pain, shortness of breath, cough, fevers, chills, night sweats, unintended weight changes. She reports continued use of imodium and takes one tab po daily. She denies any  bladder disturbances, and denies abdominal pain, nausea or vomiting. She denies any new musculoskeletal or joint aches or pains. A complete review of systems is obtained and is otherwise negative.   Past Medical History:  Past Medical History:  Diagnosis Date  . Allergy    Hay fever  . Anxiety   . Arthritis   . Colon cancer ([place]) 08/01/14   retal squamous cell ca  . GERD (gastroesophageal reflux disease)   . History of chicken pox   . Hyperlipidemia   . Hypertension   . Phlebitis and thrombophlebitis   . PONV (postoperative nausea and vomiting)   . Renal artery stenosis ([facility])    left stent in 2003    . Renovascular hypertension   . S/P radiation therapy 09/05/14-10/19/14   anal ca,50.4Gy    Past Surgical History: Past Surgical History:  Procedure Laterality Date  . ABDOMINAL HYSTERECTOMY    . BREAST SURGERY     bilateral lumpectomy-benign  . CATARACT EXTRACTION Bilateral   . COLON RESECTION N/A 08/18/2014   Procedure: LAPAROSCOPIC ASSISTED  COLON POLYP RESECTION , DIAGNOSTIC LAPROSCOPY ;  Surgeon: Leighton Ruff, MD;  Location: [facility];  Service: General;  Laterality: N/A;  . NASAL SINUS SURGERY    . NM MYOCAR PERF WALL MOTION  09/2009   bruce myoview - normal pattern of perfusion, EF 83%, low risk scan  . RENAL ARTERY ANGIOPLASTY  07/02/2001   6x55mm Genesis on Aviator balloon stent overlapping a 6x46mm Genesist on Aviator balloon stent (Dr. Marella Chimes)  . RENAL DOPPLER  09/2012   left renal artery stent - 60-99% diameter reduction  . TRANSTHORACIC ECHOCARDIOGRAM  06/2012   EF 55-60%, mild MR    Social History:  Social History   Social History  . Marital status: Divorced    Spouse name: N/A  . Number of children: 1  . Years of education: 2   Occupational History  . Education officer, museum    Social History Main Topics  . Smoking status: Current Every Day Smoker    Packs/day: 1.00    Years: 30.00    Types: Cigarettes  . Smokeless tobacco: Never Used  . Alcohol use 4.8 - 5.4 oz/week    1 - 2  Glasses of wine, 7 Standard drinks or equivalent per week     Comment: daily -glass wine  . Drug use: No  . Sexual activity: Not on file   Other Topics Concern  . Not on file   Social History Narrative   Divorced   1 son, local who lives with her   Retired from [employer] work   Hydrologist grad   Has poodle named "Murphy"    Family History: Family History  Problem Relation Age of Onset  . Hypertension Mother   . Heart disease Mother   . Dementia Mother   . Heart disease Father   . Hyperlipidemia Father   . Cancer Father     lung cancer and prostate cancer   . Colon cancer Neg  Hx   . Breast cancer Cousin   . Cancer Cousin     4 paternal and 1 maternal cousins had breast cancer      ALLERGIES:  is allergic to crestor [rosuvastatin].  Meds: Current Outpatient Prescriptions  Medication Sig Dispense Refill  . ALPRAZolam (XANAX) 0.25 MG tablet Take 0.125 mg by mouth daily as needed for anxiety.    Marland Kitchen atenolol (TENORMIN) 50 MG tablet Take 1 tablet (50 mg total) by mouth daily after supper. 90 tablet 3  . atenolol (TENORMIN) 50 MG tablet Take 1 tablet by mouth  daily 90 tablet 2  . candesartan (ATACAND) 32 MG tablet Take 1 tablet by mouth  daily 90 tablet 3  . loperamide (IMODIUM) 2 MG capsule States taking 1/2 tab daily    . Multiple Vitamin (MULTIVITAMIN) tablet Take 1 tablet by mouth daily.    . ranitidine (ZANTAC) 150 MG tablet Take 150 mg by mouth daily as needed for heartburn.    . sertraline (ZOLOFT) 100 MG tablet Take 100 mg by mouth daily.     . cetirizine (ZYRTEC ALLERGY) 10 MG tablet Take 5 mg by mouth daily as needed for allergies.    Marland Kitchen varenicline (CHANTIX CONTINUING MONTH PAK) 1 MG tablet Take 1 tablet (1 mg total) by mouth 2 (two) times daily. (Patient not taking: Reported on 10/24/2015) 60 tablet 1  . varenicline (CHANTIX STARTING MONTH PAK) 0.5 MG X 11 & 1 MG X 42 tablet Take one 0.5 mg tablet by mouth once daily for 3 days, then increase to one 0.5 mg tablet twice daily for 4 days, then increase to one 1 mg tablet twice daily. (Patient not taking: Reported on 10/24/2015) 53 tablet 0   No current facility-administered medications for this encounter.     Physical Findings:  height is 5\' 3"  (1.6 m) and weight is 143 lb 9.6 oz (65.1 kg). Her oral temperature is 98.4 F (36.9 C). Her blood pressure is 170/70 (abnormal) and her pulse is 70. Her respiration is 18 and oxygen saturation is 100%.    In general this is a well appearing Caucasian woman in no acute distress. She is alert and oriented x4 and appropriate throughout the examination. HEENT reveals  that the patient is normocephalic, atraumatic. EOMs are intact. PERRLA. Skin is intact without any evidence of gross lesions. Cardiovascular exam reveals a regular rate and rhythm, no clicks rubs or murmurs are auscultated. Chest is clear to auscultation bilaterally. Lymphatic assessment is performed and does not reveal any adenopathy in the cervical, supraclavicular, axillary, or inguinal chains. Abdomen has active bowel sounds in all quadrants and is intact. The abdomen is soft, non tender, non distended. Lower extremities are negative for pretibial  pitting edema, deep calf tenderness, cyanosis or clubbing. Pelvic exam reveals normal appearing external female genitalia, labial, perineal, and perianal tissue without gross lesion. Speculum exam reveals a well healed vaginal cuff without visible lesion. Bimanual exam reveals minimal scaring best palpable at the proximal third of the vagina on the left. No palpable masses are identified. The ovaries are not palpable bilaterally. On rectovaginal exam, there is no induration or nodularity of the septum. No palpable thickening is noted of the rectal mucosa.  Lab Findings: Lab Results  Component Value Date   WBC 5.3 06/29/2015   HGB 14.2 06/29/2015   HCT 41.3 06/29/2015   MCV 94.7 06/29/2015   PLT 162 06/29/2015    Radiographic Findings: No results found.  Impression/Plan: 1. 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 The patient continues to remain clinically without evidence of disease. I would like to see her back in 9 months time to again try to stager her appointments with colorectal surgery and medical oncology. She is in agreement with this plan. 2. GYN care. The patient had a normal pelvic exam today. I will plan to perform this along with her exam for her rectal cancer annually. Based on her history and ASCCP guidelines, there is no role for pap smear screening at this time.  3. Bilateral leg pains. The patient was encouraged to  discuss her symptoms with medical oncology and her PCP. Her symptoms do not sound like neuropathic complaints, rather restless leg syndrome perhaps. 4. HTN. The patient is asymptomatic and her blood pressure is checked at home, she reports normal readings today and this week. She attributes her HTN to anxiety about her appointment.    Carola Rhine, PAC

## 2015-10-27 ENCOUNTER — Ambulatory Visit (AMBULATORY_SURGERY_CENTER): Payer: Self-pay | Admitting: *Deleted

## 2015-10-27 VITALS — Ht 63.0 in | Wt 143.4 lb

## 2015-10-27 DIAGNOSIS — Z85038 Personal history of other malignant neoplasm of large intestine: Secondary | ICD-10-CM

## 2015-10-27 MED ORDER — NA SULFATE-K SULFATE-MG SULF 17.5-3.13-1.6 GM/177ML PO SOLN: 1.0000 | Freq: Once | ORAL | 0 refills | Status: AC

## 2015-10-27 NOTE — Progress Notes (Signed)
Denies allergies to eggs or soy products. Denies complications with sedation or anesthesia. Denies O2 use. Denies use of diet or weight loss medications.  Emmi instructions declined for colonoscopy.  

## 2015-10-31 ENCOUNTER — Encounter: Payer: Self-pay | Admitting: Gastroenterology

## 2015-10-31 ENCOUNTER — Ambulatory Visit: Payer: Self-pay | Admitting: Radiation Oncology

## 2015-11-01 ENCOUNTER — Other Ambulatory Visit: Payer: Self-pay | Admitting: *Deleted

## 2015-11-01 DIAGNOSIS — C20 Malignant neoplasm of rectum: Secondary | ICD-10-CM

## 2015-11-02 ENCOUNTER — Ambulatory Visit (HOSPITAL_BASED_OUTPATIENT_CLINIC_OR_DEPARTMENT_OTHER): Payer: Medicare Other | Admitting: Hematology

## 2015-11-02 ENCOUNTER — Other Ambulatory Visit (HOSPITAL_BASED_OUTPATIENT_CLINIC_OR_DEPARTMENT_OTHER): Payer: Medicare Other

## 2015-11-02 ENCOUNTER — Encounter: Payer: Self-pay | Admitting: Hematology

## 2015-11-02 VITALS — BP 163/59 | HR 64 | Temp 98.5°F | Resp 18 | Ht 63.0 in | Wt 142.6 lb

## 2015-11-02 DIAGNOSIS — C20 Malignant neoplasm of rectum: Secondary | ICD-10-CM | POA: Diagnosis not present

## 2015-11-02 DIAGNOSIS — F419 Anxiety disorder, unspecified: Secondary | ICD-10-CM

## 2015-11-02 DIAGNOSIS — Z720 Tobacco use: Secondary | ICD-10-CM

## 2015-11-02 DIAGNOSIS — I10 Essential (primary) hypertension: Secondary | ICD-10-CM

## 2015-11-02 LAB — CBC WITH DIFFERENTIAL/PLATELET
BASO%: 0.4 % (ref 0.0–2.0)
BASOS ABS: 0 10*3/uL (ref 0.0–0.1)
EOS ABS: 0.1 10*3/uL (ref 0.0–0.5)
EOS%: 2.2 % (ref 0.0–7.0)
HCT: 40.4 % (ref 34.8–46.6)
HEMOGLOBIN: 13.6 g/dL (ref 11.6–15.9)
LYMPH%: 13.2 % — AB (ref 14.0–49.7)
MCH: 31.9 pg (ref 25.1–34.0)
MCHC: 33.5 g/dL (ref 31.5–36.0)
MCV: 95.2 fL (ref 79.5–101.0)
MONO#: 0.5 10*3/uL (ref 0.1–0.9)
MONO%: 7.8 % (ref 0.0–14.0)
NEUT#: 4.7 10*3/uL (ref 1.5–6.5)
NEUT%: 76.4 % (ref 38.4–76.8)
Platelets: 204 10*3/uL (ref 145–400)
RBC: 4.25 10*6/uL (ref 3.70–5.45)
RDW: 13.3 % (ref 11.2–14.5)
WBC: 6.1 10*3/uL (ref 3.9–10.3)
lymph#: 0.8 10*3/uL — ABNORMAL LOW (ref 0.9–3.3)

## 2015-11-02 LAB — COMPREHENSIVE METABOLIC PANEL
ALBUMIN: 3.4 g/dL — AB (ref 3.5–5.0)
ALK PHOS: 54 U/L (ref 40–150)
ALT: 13 U/L (ref 0–55)
AST: 21 U/L (ref 5–34)
Anion Gap: 7 mEq/L (ref 3–11)
BUN: 9.7 mg/dL (ref 7.0–26.0)
CHLORIDE: 103 meq/L (ref 98–109)
CO2: 27 meq/L (ref 22–29)
Calcium: 9.4 mg/dL (ref 8.4–10.4)
Creatinine: 0.7 mg/dL (ref 0.6–1.1)
EGFR: 82 mL/min/{1.73_m2} — AB (ref >90)
GLUCOSE: 100 mg/dL (ref 70–140)
POTASSIUM: 4.1 meq/L (ref 3.5–5.1)
SODIUM: 137 meq/L (ref 136–145)
Total Bilirubin: 0.32 mg/dL (ref 0.20–1.20)
Total Protein: 6.6 g/dL (ref 6.4–8.3)

## 2015-11-02 NOTE — Progress Notes (Signed)
[place]  [phone] Fax:(336) [phone]  Clinic follow up Note   Patient Care Team: Tonia Ghent, MD as PCP - General (Family Medicine) Leighton Ruff, MD as Consulting Physician (General Surgery) Kyung Rudd, MD as Consulting Physician (Radiation Oncology) 11/02/2015  CHIEF COMPLAINTS:  Follow up rectal squamous cell carcinoma  Oncology History   Presented with heme + stool, loose stools for 2 months     Rectal cancer ([facility])   08/01/2014 Pathology Results    Rectum, biopsy, distal mass - SQUAMOUS CELL CARCINOMA, BASALOID TYPE      08/01/2014 Procedure    Colonoscopy-ulcerated, malignant appearing distal rectal mass adjacent to the internal anal sphincter. #14 polyps spread throughout the colon (#12 removed)      08/01/2014 Tumor Marker    CEA=5.3      08/01/2014 Initial Diagnosis    Rectal cancer      08/04/2014 Imaging    CT C/A/P-No evidence of metastatic disease in chest, abdomen, or pelvis      08/18/2014 Procedure    Colonoscopy and parotidectomy. Path reviewed tubular adenoma.      08/25/2014 Imaging    PET IMPRESSION: 1. Hypermetabolic activity at the anus corresponding with known squamous cell carcinoma. No evidence of metastatic disease. 2. No other suspicious metabolic activity. 3. Diffuse atherosclerosis.      09/05/2014 - 10/19/2014 Radiation Therapy    definitive XRT to 50.4 Gy with concurrent chemotherapy       HISTORY OF PRESENTING ILLNESS:  Glenda Stevens 70 y.o. female is here because of recently diagnosed rectal squamous cell carcinoma.  She has been having diarrhea for the past 4 months, she has watery bowel movement 1-2 times daily, with some urgency, no blood, associated with abdominal cramps before the bowel movement. She denies any nausea or vomiting. She has good appetite, no weight loss. No fever or chills. She didn't seek medical attention until a month's ago she was in [place] by her primary care physician. Lab CBC  and CMP was unremarkable, stool guaiac was positive. She was referred to gastroenterologist Dr. Ardis Hughs, and underwent her first colonoscopy on 08/01/2014. The colonoscopy showed a total of 14 polyps, 12 polyps were removed, and a distal rectal mass adjacent to the internal and no sphincter was found, which was suspicious for malignancy. Biopsy showed squamous cell carcinoma. Staging scan CT showed no evidence of adenopathy or distant metastasis. She was referred to our cancer center to discuss chemotherapy and radiation. She is going to see radiation oncologist Dr. Lisbeth Renshaw this afternoon.  CURRENT THERAPY: observation   INTERIM HISTORY Mrs Chapa returns for follow-up. She has been doing very well. She had some weakness in her legs a few weeks and felt tired, but this has resolved, and she feels great in the past few weeks. She has good appetite, energy level, denies any pain, nausea, or other symptoms. She still has intermittent mild diarrhea, for which she takes Imodium. She occasionally gets constipated from Imodium. No hematochezia, rectal discomfort.  MEDICAL HISTORY:  Past Medical History:  Diagnosis Date  . Allergy    Hay fever  . Anxiety   . Arthritis   . Colon cancer ([place]) 08/01/14   retal squamous cell ca  . GERD (gastroesophageal reflux disease)   . History of chicken pox   . Hyperlipidemia   . Hypertension   . Phlebitis and thrombophlebitis   . PONV (postoperative nausea and vomiting)   . Renal artery stenosis ([facility])    left stent  in 2003  . Renovascular hypertension   . S/P radiation therapy 09/05/14-10/19/14   anal ca,50.4Gy    SURGICAL HISTORY: Past Surgical History:  Procedure Laterality Date  . ABDOMINAL HYSTERECTOMY    . BREAST SURGERY     bilateral lumpectomy-benign  . CATARACT EXTRACTION Bilateral   . COLON RESECTION N/A 08/18/2014   Procedure: LAPAROSCOPIC ASSISTED  COLON POLYP RESECTION , DIAGNOSTIC LAPROSCOPY ;  Surgeon: Leighton Ruff, MD;  Location: [facility];   Service: General;  Laterality: N/A;  . NASAL SINUS SURGERY     patient denies having had nasal sinus surgery  . NM MYOCAR PERF WALL MOTION  09/2009   bruce myoview - normal pattern of perfusion, EF 83%, low risk scan  . RENAL ARTERY ANGIOPLASTY  07/02/2001   6x30mm Genesis on Aviator balloon stent overlapping a 6x33mm Genesist on Aviator balloon stent (Dr. Marella Chimes)  . RENAL DOPPLER  09/2012   left renal artery stent - 60-99% diameter reduction  . TRANSTHORACIC ECHOCARDIOGRAM  06/2012   EF 55-60%, mild MR    SOCIAL HISTORY: Social History   Social History  . Marital status: Divorced    Spouse name: N/A  . Number of children: 1  . Years of education: 23   Occupational History  . Education officer, museum    Social History Main Topics  . Smoking status: Current Every Day Smoker    Packs/day: 1.00    Years: 30.00    Types: Cigarettes    Start date: 03/11/1961  . Smokeless tobacco: Never Used  . Alcohol use 4.2 oz/week    7 Glasses of wine per week     Comment: daily -glass wine  . Drug use: No  . Sexual activity: Not on file   Other Topics Concern  . Not on file   Social History Narrative   Divorced   1 son, local who lives with her   Retired from [employer] work   Hydrologist grad   Has poodle named "Murphy"    FAMILY HISTORY: Family History  Problem Relation Age of Onset  . Hypertension Mother   . Heart disease Mother   . Dementia Mother   . Heart disease Father   . Hyperlipidemia Father   . Cancer Father     lung cancer and prostate cancer   . Breast cancer Cousin   . Cancer Cousin     4 paternal and 1 maternal cousins had breast cancer   . Colon cancer Neg Hx   . Esophageal cancer Neg Hx   . Stomach cancer Neg Hx   . Rectal cancer Neg Hx     ALLERGIES:  is allergic to crestor [rosuvastatin].  MEDICATIONS:  Current Outpatient Prescriptions  Medication Sig Dispense Refill  . ALPRAZolam (XANAX) 0.25 MG tablet Take 0.125 mg by mouth daily as needed for anxiety.    Marland Kitchen  atenolol (TENORMIN) 50 MG tablet Take 1 tablet by mouth  daily 90 tablet 2  . candesartan (ATACAND) 32 MG tablet Take 1 tablet by mouth  daily 90 tablet 3  . cetirizine (ZYRTEC ALLERGY) 10 MG tablet Take 5 mg by mouth daily as needed for allergies.    Marland Kitchen loperamide (IMODIUM) 2 MG capsule States taking 1/2 tab daily    . Multiple Vitamin (MULTIVITAMIN) tablet Take 1 tablet by mouth daily.    . Multiple Vitamins-Minerals (MULTIVITAMIN ADULT PO) Take 1 tablet by mouth daily.    . Omega-3 Fatty Acids (FISH OIL PO) Take 1 tablet by mouth daily.    Marland Kitchen  Probiotic Product (PROBIOTIC PO) Take 1 tablet by mouth daily.    . ranitidine (ZANTAC) 150 MG tablet Take 150 mg by mouth daily as needed for heartburn.    . sertraline (ZOLOFT) 100 MG tablet Take 100 mg by mouth daily.      No current facility-administered medications for this visit.     REVIEW OF SYSTEMS:   Constitutional: Denies fevers, chills or abnormal night sweats Eyes: Denies blurriness of vision, double vision or watery eyes Ears, nose, mouth, throat, and face: Denies mucositis or sore throat Respiratory: Denies cough, dyspnea or wheezes Cardiovascular: Denies palpitation, chest discomfort or lower extremity swelling Gastrointestinal:  Denies nausea, heartburn or change in bowel habits Skin: Denies abnormal skin rashes Lymphatics: Denies new lymphadenopathy or easy bruising Neurological:Denies numbness, tingling or new weaknesses Behavioral/Psych: Mood is stable, no new changes  All other systems were reviewed with the patient and are negative.  PHYSICAL EXAMINATION: ECOG PERFORMANCE STATUS: 0  Vitals:   11/02/15 1356  BP: (!) 163/59  Pulse: 64  Resp: 18  Temp: 98.5 F (36.9 C)   Filed Weights   11/02/15 1356  Weight: 142 lb 9.6 oz (64.7 kg)    GENERAL:alert, no distress and comfortable SKIN: skin color, texture, turgor are normal, no rashes or significant lesions EYES: normal, conjunctiva are pink and non-injected, sclera  clear OROPHARYNX:no exudate, no erythema and lips, buccal mucosa, and tongue normal  NECK: supple, thyroid normal size, non-tender, without nodularity LYMPH:  no palpable lymphadenopathy in the cervical, axillary or bilaterla inguinal LUNGS: clear to auscultation and percussion with normal breathing effort HEART: regular rate & rhythm and no murmurs and no lower extremity edema ABDOMEN:abdomen soft, non-tender and normal bowel sounds. Rectal exam was normal, no palpable mass.  PSYCH: alert & oriented x 3 with fluent speech NEURO: no focal motor/sensory deficits  LABORATORY DATA:  CBC Latest Ref Rng & Units 11/02/2015 06/29/2015 02/23/2015  WBC 3.9 - 10.3 10e3/uL 6.1 5.3 5.5  Hemoglobin 11.6 - 15.9 g/dL 13.6 14.2 14.8  Hematocrit 34.8 - 46.6 % 40.4 41.3 43.9  Platelets 145 - 400 10e3/uL 204 162 157    CMP Latest Ref Rng & Units 11/02/2015 06/29/2015 02/23/2015  Glucose 70 - 140 mg/dl 100 108 89  BUN 7.0 - 26.0 mg/dL 9.7 10.1 12.8  Creatinine 0.6 - 1.1 mg/dL 0.7 0.8 0.8  Sodium 136 - 145 mEq/L 137 137 138  Potassium 3.5 - 5.1 mEq/L 4.1 4.2 4.4  Chloride 101 - 111 mmol/L - - -  CO2 22 - 29 mEq/L 27 29 26   Calcium 8.4 - 10.4 mg/dL 9.4 9.4 9.2  Total Protein 6.4 - 8.3 g/dL 6.6 6.6 6.6  Total Bilirubin 0.20 - 1.20 mg/dL 0.32 0.38 0.32  Alkaline Phos 40 - 150 U/L 54 56 50  AST 5 - 34 U/L 21 20 21   ALT 0 - 55 U/L 13 12 14     PATHOLOGY REPORT: Diagnosis 08/01/2014  1. Colon, biopsy, ascending and cecum, polyp (7) - TUBULAR ADENOMAS (7). NO HIGH GRADE DYSPLASIA OR MALIGNANCY IDENTIFIED. 2. Colon, biopsy, hepatic flexure, polyp (3) - TUBULAR ADENOMAS (3). NO HIGH GRADE DYSPLASIA OR MALIGNANCY IDENTIFIED. 3. Colon, biopsy, transverse - TWO FRAGMENTS OF TUBULAR ADENOMA. NO HIGH GRADE DYSPLASIA OR MALIGNANCY IDENTIFIED. 4. Colon, biopsy, descending, sigmoid - TUBULAR ADENOMAS (3). NO HIGH GRADE DYSPLASIA OR MALIGNANCY IDENTIFIED. 5. Rectum, biopsy, distal mass - SQUAMOUS CELL CARCINOMA,  BASALOID TYPE. - SEE MICROSCOPIC DESCRIPTION.  Colon, polyp(s), distal ascending 08/18/2014 - MULTIPLE FRAGMENTS OF  TUBULAR ADENOMA. - NO HIGH GRADE DYSPLASIA OR MALIGNANCY.  RADIOGRAPHIC STUDIES:  PET 02/23/2015 IMPRESSION: 1. Interval response to therapy (SUV 4.49. Previously 7.9.). 2. Decrease in FDG uptake associated with anal tumor. 3. No new or progressive disease identified within the chest, abdomen or pelvis.   Colonoscopy 08/01/2014 They were adjacent to eachother. They were located on both sides of a fold, sessile, slightly firm. The largest of the two (31mm) was biopsied and then the site was labled with SPOT via submucosal injection. These biopsies were in jar #3. The other 12 polyp were all sessile, located in cecum, ascending, hepatic flexure, descending and sigmoid. Two were removed with snare cautery (ascending and sigmoid), and the rest removed with cold snare. Sent in three different jars (#1 ascending, cecum), (#2hepatic flexure), (#4 descending, sigmoid). There was also an ulcerated, malignant appearing distal rectal mass that was adjacent to the internal anal sphincter. This was non-cirumferential, 3-5cm across, friable, biopsied extensively (#5). There were 8-10 diminutive polyps spread throughout the colon that I did not remove (2-20mm each). Retroflexed views revealed no abnormalities. The time to cecum = 41min Withdrawal time = 44min The scope was withdrawn and the procedure completed.  COMPLICATIONS: There were no immediate complications.  ENDOSCOPIC IMPRESSION: Multiple sites of significant neoplasia, see above   PET 02/23/2015 IMPRESSION: 1. Interval response to therapy. 2. Decrease in FDG uptake associated with anal tumor. 3. No new or progressive disease identified within the chest, abdomen or pelvis.   COLONOSCOPY 08/18/2014 ENDOSCOPIC IMPRESSION: In the distal ascending colon, near the hepatic flexure the two sessile polyps were again located. One was  1cm, the other about 1.7cm along a fold. Neither now appeared cancerous. Previous tatoo at the site was somewhat visible. With external pressure and manipulation of the colon by Dr. Marcello Moores using laparscopic technique I was able to resect the polyps. I used saline pressure injector to lift the polyps and then piecemeal snare/cautery technique. I felt there was a small amount of remaining polyp tissue at the site and I used snare tip cautery to cauterize it. Three other small (2-37mm) clearly adenomatous polyps were removed from the right colon (not retrieved). The examination was otherwise normal  RECOMMENDATIONS: Await final pathology, likely you will need repeat colonoscopy in 1 year.   ASSESSMENT & PLAN:  70 year old Caucasian female with past medical history of hypertension, anxiety and depression, mild arthritis, who presents with watery diarrhea for 4 months.  1. Rectal squamous cell carcinoma, [id number], stage I  -I previously reviewed her colonoscopy findings, the rectal mass biopsy results with patient and her son in great details. -We reviewed the natural history of rectal cancer, and staging workup. CT scans reviewed no local adenopathy or distant metastasis. she early stage rectal cancer. -She completed her concurrent chemoradiation, with 2 cycles of full doses of FU and mitomycin. The goal of treatment is curative -I reviewed her restaging PET scan from 02/2015, which showed interval response and decreased FDG uptake in the and no mass. No other evidence of recurrence. We discussed that the residual hypermetabolic activity is possible related to her radiation, less likely residual tumor. -She is currently doing very well, exam including rectal exam was negative, lab results reviewed with her, normal CBC and CMP, no evidence of recurrence --we'll continue cancer surveillance. She is scheduled to have a colonoscopy in a few weeks -Repeat surveillance PET scan in 4 months  2. Smoking  cessation -I strongly encouraged her to stop smoking, she is willing to cut  back.  3. Hypertension, anxiety -She'll continue follow-up with her primary care physician   Plan -RTC in 4 months with lab aand PET one week before    All questions were answered. The patient knows to call the clinic with any problems, questions or concerns.  I spent 15 minutes counseling the patient face to face. The total time spent in the appointment was 20 minutes and more than 50% was on counseling.     Truitt Merle, MD 11/02/2015 2:21 PM

## 2015-11-10 ENCOUNTER — Encounter: Payer: Self-pay | Admitting: Gastroenterology

## 2015-11-10 ENCOUNTER — Ambulatory Visit (AMBULATORY_SURGERY_CENTER): Payer: Medicare Other | Admitting: Gastroenterology

## 2015-11-10 VITALS — BP 152/68 | HR 74 | Temp 98.6°F | Resp 18 | Ht 63.0 in | Wt 143.0 lb

## 2015-11-10 DIAGNOSIS — D122 Benign neoplasm of ascending colon: Secondary | ICD-10-CM

## 2015-11-10 DIAGNOSIS — D123 Benign neoplasm of transverse colon: Secondary | ICD-10-CM

## 2015-11-10 DIAGNOSIS — Z85038 Personal history of other malignant neoplasm of large intestine: Secondary | ICD-10-CM

## 2015-11-10 MED ORDER — SODIUM CHLORIDE 0.9 % IV SOLN: 500.0000 mL | INTRAVENOUS | Status: DC

## 2015-11-10 NOTE — Progress Notes (Signed)
Called to room to assist during endoscopic procedure.  Patient ID and intended procedure confirmed with present staff. Received instructions for my participation in the procedure from the performing physician.  

## 2015-11-10 NOTE — Patient Instructions (Addendum)
Colon polyps x4  removed today. Handout given on polyps. Result letter in your mail in 2-3 weeks. Resume current medications. NO aspirin, or NSAIDS, aleve,ibuprofen, or any other non-steriodal anti-inflammatory medications for 10 days!! Call us with any questions or concerns. Thank you!   YOU HAD AN ENDOSCOPIC PROCEDURE TODAY AT [facility]:   Refer to the procedure report that was given to you for any specific questions about what was found during the examination.  If the procedure report does not answer your questions, please call your gastroenterologist to clarify.  If you requested that your care partner not be given the details of your procedure findings, then the procedure report has been included in a sealed envelope for you to review at your convenience later.  YOU SHOULD EXPECT: Some feelings of bloating in the abdomen. Passage of more gas than usual.  Walking can help get rid of the air that was put into your GI tract during the procedure and reduce the bloating. If you had a lower endoscopy (such as a colonoscopy or flexible sigmoidoscopy) you may notice spotting of blood in your stool or on the toilet paper. If you underwent a bowel prep for your procedure, you may not have a normal bowel movement for a few days.  Please Note:  You might notice some irritation and congestion in your nose or some drainage.  This is from the oxygen used during your procedure.  There is no need for concern and it should clear up in a day or so.  SYMPTOMS TO REPORT IMMEDIATELY:   Following lower endoscopy (colonoscopy or flexible sigmoidoscopy):  Excessive amounts of blood in the stool  Significant tenderness or worsening of abdominal pains  Swelling of the abdomen that is new, acute  Fever of 100F or higher  For urgent or emergent issues, a gastroenterologist can be reached at any hour by calling [phone].   DIET:  We do recommend a small meal at first, but then you may proceed  to your regular diet.  Drink plenty of fluids but you should avoid alcoholic beverages for 24 hours.  ACTIVITY:  You should plan to take it easy for the rest of today and you should NOT DRIVE or use heavy machinery until tomorrow (because of the sedation medicines used during the test).    FOLLOW UP: Our staff will call the number listed on your records the next business day following your procedure to check on you and address any questions or concerns that you may have regarding the information given to you following your procedure. If we do not reach you, we will leave a message.  However, if you are feeling well and you are not experiencing any problems, there is no need to return our call.  We will assume that you have returned to your regular daily activities without incident.  If any biopsies were taken you will be contacted by phone or by letter within the next 1-3 weeks.  Please call us at [phone] if you have not heard about the biopsies in 3 weeks.    SIGNATURES/CONFIDENTIALITY: You and/or your care partner have signed paperwork which will be entered into your electronic medical record.  These signatures attest to the fact that that the information above on your After Visit Summary has been reviewed and is understood.  Full responsibility of the confidentiality of this discharge information lies with you and/or your care-partner.

## 2015-11-10 NOTE — Op Note (Signed)
[facility] Patient Name: Glenda Stevens Procedure Date: 11/10/2015 9:30 AM MRN: [id number] Endoscopist: Milus Banister , MD Age: 70 Referring MD:  Date of Birth: 1945-10-01 Gender: Female Account #: [id number] Procedure:                Colonoscopy Indications:              High risk colon cancer surveillance: Personal                            history of colonic polyps (multiple adenomatous                            polyps removed 2016, also anal squamous cell cancer                            2016 treated with chemo/XRT) Medicines:                Monitored Anesthesia Care Procedure:                Pre-Anesthesia Assessment:                           - Prior to the procedure, a History and Physical                            was performed, and patient medications and                            allergies were reviewed. The patient's tolerance of                            previous anesthesia was also reviewed. The risks                            and benefits of the procedure and the sedation                            options and risks were discussed with the patient.                            All questions were answered, and informed consent                            was obtained. Prior Anticoagulants: The patient has                            taken no previous anticoagulant or antiplatelet                            agents. ASA Grade Assessment: II - A patient with                            mild systemic disease. After reviewing the risks  and benefits, the patient was deemed in                            satisfactory condition to undergo the procedure.                           After obtaining informed consent, the colonoscope                            was passed under direct vision. Throughout the                            procedure, the patient's blood pressure, pulse, and                            oxygen saturations were monitored  continuously. The                            Model PCF-[id number]) scope was introduced                            through the anus and advanced to the the cecum,                            identified by appendiceal orifice and ileocecal                            valve. The colonoscopy was performed without                            difficulty. The patient tolerated the procedure                            well. The quality of the bowel preparation was                            excellent. The ileocecal valve, appendiceal                            orifice, and rectum were photographed. Scope In: 9:38:20 AM Scope Out: 10:01:26 AM Scope Withdrawal Time: 0 hours 20 minutes 3 seconds  Total Procedure Duration: 0 hours 23 minutes 6 seconds  Findings:                 Three sessile polyps were found in the ascending                            colon. The polyps were 4 to 5 mm in size and they                            were all at sites of previous polypectomy (evident                            by adjacent scars). These polyps  were removed with                            a hot snare. Resection and retrieval were complete.                            One of the polyps was only partially resectable                            with snare, cautery even after saline lift). I felt                            this was due to tethering related to the scar and                            so I treated the 1-50mm of remaining polyp with                            snare tip cautery.                           A 4 mm polyp was found in the transverse colon. The                            polyp was sessile. The polyp was removed with a                            cold snare. Resection and retrieval were complete.                           The exam was otherwise without abnormality on                            direct and retroflexion views. Complications:            No immediate complications. Estimated  blood loss:                            None. Estimated Blood Loss:     Estimated blood loss: none. Impression:               - Three 4 to 5 mm polyps in the ascending colon,                            removed with a hot snare. Resected and retrieved.                           - One 4 mm polyp in the transverse colon, removed                            with a cold snare. Resected and retrieved.                           - The  examination was otherwise normal on direct                            and retroflexion views. Recommendation:           - Patient has a contact number available for                            emergencies. The signs and symptoms of potential                            delayed complications were discussed with the                            patient. Return to normal activities tomorrow.                            Written discharge instructions were provided to the                            patient.                           - Resume previous diet.                           - Continue present medications.                           You will receive a letter within 2-3 weeks with the                            pathology results and my final recommendations.                           If the polyp(s) is proven to be 'pre-cancerous' on                            pathology, you will need repeat colonoscopy in 3                            years. Milus Banister, MD 11/10/2015 10:08:56 AM This report has been signed electronically.

## 2015-11-14 ENCOUNTER — Telehealth: Payer: Self-pay

## 2015-11-14 ENCOUNTER — Inpatient Hospital Stay: Admission: RE | Admit: 2015-11-14 | Payer: Medicare Other | Source: Ambulatory Visit | Admitting: Radiation Oncology

## 2015-11-14 ENCOUNTER — Ambulatory Visit: Payer: Medicare Other | Admitting: Radiation Oncology

## 2015-11-14 NOTE — Telephone Encounter (Signed)
Left message on answering machine. 

## 2015-11-17 ENCOUNTER — Encounter: Payer: Self-pay | Admitting: Gastroenterology

## 2015-11-20 ENCOUNTER — Telehealth: Payer: Self-pay | Admitting: Hematology

## 2015-11-20 NOTE — Telephone Encounter (Signed)
Called patient to conf appt per 11/05/15 los. L/m appt ltr and schd mailed.

## 2015-11-22 ENCOUNTER — Encounter: Payer: Self-pay | Admitting: Hematology

## 2015-11-22 ENCOUNTER — Telehealth: Payer: Self-pay | Admitting: Hematology

## 2015-11-22 NOTE — Telephone Encounter (Signed)
Returned call to patient regarding rescheduling December appointments. Was not able to contact the patient during the return call.

## 2015-11-23 ENCOUNTER — Encounter: Payer: Self-pay | Admitting: *Deleted

## 2015-12-27 ENCOUNTER — Ambulatory Visit (INDEPENDENT_AMBULATORY_CARE_PROVIDER_SITE_OTHER): Payer: Medicare Other

## 2015-12-27 DIAGNOSIS — Z23 Encounter for immunization: Secondary | ICD-10-CM

## 2016-02-23 ENCOUNTER — Telehealth: Payer: Self-pay | Admitting: Hematology

## 2016-02-23 NOTE — Telephone Encounter (Signed)
Lvm adviisng appt chgd from 12/28 to 03/18/16 @ 9.45 due to md pal.

## 2016-02-29 ENCOUNTER — Other Ambulatory Visit (HOSPITAL_BASED_OUTPATIENT_CLINIC_OR_DEPARTMENT_OTHER): Payer: Medicare Other

## 2016-02-29 DIAGNOSIS — C20 Malignant neoplasm of rectum: Secondary | ICD-10-CM | POA: Diagnosis not present

## 2016-02-29 LAB — CBC WITH DIFFERENTIAL/PLATELET
BASO%: 0.5 % (ref 0.0–2.0)
Basophils Absolute: 0 10*3/uL (ref 0.0–0.1)
EOS ABS: 0.2 10*3/uL (ref 0.0–0.5)
EOS%: 3.1 % (ref 0.0–7.0)
HCT: 43.1 % (ref 34.8–46.6)
HGB: 14.7 g/dL (ref 11.6–15.9)
LYMPH%: 10.4 % — AB (ref 14.0–49.7)
MCH: 32.5 pg (ref 25.1–34.0)
MCHC: 34.1 g/dL (ref 31.5–36.0)
MCV: 95.1 fL (ref 79.5–101.0)
MONO#: 0.6 10*3/uL (ref 0.1–0.9)
MONO%: 9.9 % (ref 0.0–14.0)
NEUT#: 4.4 10*3/uL (ref 1.5–6.5)
NEUT%: 76.1 % (ref 38.4–76.8)
PLATELETS: 178 10*3/uL (ref 145–400)
RBC: 4.53 10*6/uL (ref 3.70–5.45)
RDW: 13.6 % (ref 11.2–14.5)
WBC: 5.8 10*3/uL (ref 3.9–10.3)
lymph#: 0.6 10*3/uL — ABNORMAL LOW (ref 0.9–3.3)

## 2016-02-29 LAB — COMPREHENSIVE METABOLIC PANEL
ALT: 15 U/L (ref 0–55)
ANION GAP: 8 meq/L (ref 3–11)
AST: 21 U/L (ref 5–34)
Albumin: 3.6 g/dL (ref 3.5–5.0)
Alkaline Phosphatase: 67 U/L (ref 40–150)
BUN: 8.8 mg/dL (ref 7.0–26.0)
CHLORIDE: 101 meq/L (ref 98–109)
CO2: 27 meq/L (ref 22–29)
Calcium: 9.4 mg/dL (ref 8.4–10.4)
Creatinine: 0.8 mg/dL (ref 0.6–1.1)
EGFR: 77 mL/min/{1.73_m2} — AB (ref >90)
GLUCOSE: 72 mg/dL (ref 70–140)
Potassium: 4 mEq/L (ref 3.5–5.1)
SODIUM: 137 meq/L (ref 136–145)
Total Bilirubin: 0.41 mg/dL (ref 0.20–1.20)
Total Protein: 7 g/dL (ref 6.4–8.3)

## 2016-03-06 ENCOUNTER — Encounter (HOSPITAL_COMMUNITY)
Admission: RE | Admit: 2016-03-06 | Discharge: 2016-03-06 | Disposition: A | Discharge status: Home / Self Care | Payer: Medicare Other | Source: Ambulatory Visit | Attending: Hematology | Admitting: Hematology

## 2016-03-06 DIAGNOSIS — C20 Malignant neoplasm of rectum: Secondary | ICD-10-CM | POA: Insufficient documentation

## 2016-03-06 LAB — GLUCOSE, CAPILLARY: GLUCOSE-CAPILLARY: 102 mg/dL — AB (ref 65–99)

## 2016-03-06 MED ORDER — FLUDEOXYGLUCOSE F - 18 (FDG) INJECTION
7.1400 | Freq: Once | INTRAVENOUS | Status: AC | PRN
  Administered 2016-03-06: 7.14 via INTRAVENOUS

## 2016-03-07 ENCOUNTER — Ambulatory Visit: Payer: Medicare Other | Admitting: Hematology

## 2016-03-14 NOTE — Progress Notes (Signed)
[facility]  Telephone:([phone]) [phone] Fax:([phone]) [phone]  Clinic follow up Note   Patient Care Team: Tonia Ghent, MD as PCP - General (Family Medicine) Leighton Ruff, MD as Consulting Physician (General Surgery) Kyung Rudd, MD as Consulting Physician (Radiation Oncology) 03/18/2016  CHIEF COMPLAINTS:  Follow up rectal squamous cell carcinoma  Oncology History   Presented with heme + stool, loose stools for 2 months     Rectal cancer ([facility])   08/01/2014 Pathology Results    Rectum, biopsy, distal mass - SQUAMOUS CELL CARCINOMA, BASALOID TYPE      08/01/2014 Procedure    Colonoscopy-ulcerated, malignant appearing distal rectal mass adjacent to the internal anal sphincter. #14 polyps spread throughout the colon (#12 removed)      08/01/2014 Tumor Marker    CEA=5.3      08/01/2014 Initial Diagnosis    Rectal cancer      08/04/2014 Imaging    CT C/A/P-No evidence of metastatic disease in chest, abdomen, or pelvis      08/18/2014 Procedure    Colonoscopy and parotidectomy. Path reviewed tubular adenoma.      08/25/2014 Imaging    PET IMPRESSION: 1. Hypermetabolic activity at the anus corresponding with known squamous cell carcinoma. No evidence of metastatic disease. 2. No other suspicious metabolic activity. 3. Diffuse atherosclerosis.      09/05/2014 - 10/19/2014 Radiation Therapy    definitive XRT to 50.4 Gy with concurrent chemotherapy      09/05/2015 - 10/07/2015 Chemotherapy    Mitomycin 10mg /m2 on Day 1 and 28, 5-fu 1000mg /m2 on day 1-5  and day 28-32 (96 hours)      11/10/2015 Procedure    Colonoscopy 11/10/2015 - Three 4 to 5 mm polyps in the ascending colon, removed with a hot snare. Resected and retrieved. - One 4 mm polyp in the transverse colon, removed with a cold snare. Resected and retrieved. - The examination was otherwise normal on direct and retroflexion views.       11/10/2015 Pathology Results    Diagnosis 11/10/2015 Surgical [P],  ascendingx3, transversex1, polyp (4) - TUBULAR ADENOMA (X3 FRAGMENTS). - BENIGN COLONIC MUCOSA (MULTIPLE FRAGMENTS). - NO HIGH GRADE DYSPLASIA OR MALIGNANCY.      03/06/2016 PET scan    PET 03/06/2016 IMPRESSION: Negative PET scan for residual, recurrent or metastatic disease.       HISTORY OF PRESENTING ILLNESS:  Glenda Stevens 71 y.o. female is here because of recently diagnosed rectal squamous cell carcinoma.  She has been having diarrhea for the past 4 months, she has watery bowel movement 1-2 times daily, with some urgency, no blood, associated with abdominal cramps before the bowel movement. She denies any nausea or vomiting. She has good appetite, no weight loss. No fever or chills. She didn't seek medical attention until a month's ago she was in [place] by her primary care physician. Lab CBC and CMP was unremarkable, stool guaiac was positive. She was referred to gastroenterologist Dr. Ardis Hughs, and underwent her first colonoscopy on 08/01/2014. The colonoscopy showed a total of 14 polyps, 12 polyps were removed, and a distal rectal mass adjacent to the internal and no sphincter was found, which was suspicious for malignancy. Biopsy showed squamous cell carcinoma. Staging scan CT showed no evidence of adenopathy or distant metastasis. She was referred to our cancer center to discuss chemotherapy and radiation. She is going to see radiation oncologist Dr. Lisbeth Renshaw this afternoon.  CURRENT THERAPY: observation   INTERIM HISTORY Mrs Odonnell returns for follow-up. The  patient states she has recovered well from treatment. States that she still feels "not normal down there" while pointing to her abdomen. She reports abdominal pain when she has a stool, but no pain at any other time. Reports loose bowels at times and takes imodium. If she takes too much imodium, she gets constipation. Denies hematochezia or rectal irritation. She reports she will see Dr. Marcello Moores, her surgeon, in February and every 3  months.  MEDICAL HISTORY:  Past Medical History:  Diagnosis Date  . Allergy    Hay fever  . Anxiety   . Arthritis   . Colon cancer ([facility]) 08/01/14   retal squamous cell ca  . GERD (gastroesophageal reflux disease)   . History of chicken pox   . Hyperlipidemia   . Hypertension   . Phlebitis and thrombophlebitis   . PONV (postoperative nausea and vomiting)   . Renal artery stenosis ([facility])    left stent in 2003  . Renovascular hypertension   . S/P radiation therapy 09/05/14-10/19/14   anal ca,50.4Gy    SURGICAL HISTORY: Past Surgical History:  Procedure Laterality Date  . ABDOMINAL HYSTERECTOMY    . BREAST SURGERY     bilateral lumpectomy-benign  . CATARACT EXTRACTION Bilateral   . COLON RESECTION N/A 08/18/2014   Procedure: LAPAROSCOPIC ASSISTED  COLON POLYP RESECTION , DIAGNOSTIC LAPROSCOPY ;  Surgeon: Leighton Ruff, MD;  Location: [facility];  Service: General;  Laterality: N/A;  . NASAL SINUS SURGERY     patient denies having had nasal sinus surgery  . NM MYOCAR PERF WALL MOTION  09/2009   bruce myoview - normal pattern of perfusion, EF 83%, low risk scan  . RENAL ARTERY ANGIOPLASTY  07/02/2001   6x33mm Genesis on Aviator balloon stent overlapping a 6x27mm Genesist on Aviator balloon stent (Dr. Marella Chimes)  . RENAL DOPPLER  09/2012   left renal artery stent - 60-99% diameter reduction  . TRANSTHORACIC ECHOCARDIOGRAM  06/2012   EF 55-60%, mild MR    SOCIAL HISTORY: Social History   Social History  . Marital status: Divorced    Spouse name: N/A  . Number of children: 1  . Years of education: 65   Occupational History  . Education officer, museum    Social History Main Topics  . Smoking status: Current Some Day Smoker    Packs/day: 1.00    Years: 15.00    Types: Cigarettes  . Smokeless tobacco: Never Used  . Alcohol use 4.2 oz/week    7 Glasses of wine per week  . Drug use: No  . Sexual activity: Not on file   Other Topics Concern  . Not on file   Social History Narrative     Divorced   1 son, local who lives with her   Retired from [employer] work   Hydrologist grad   Has poodle named "Murphy"    FAMILY HISTORY: Family History  Problem Relation Age of Onset  . Hypertension Mother   . Heart disease Mother   . Dementia Mother   . Heart disease Father   . Hyperlipidemia Father   . Cancer Father     lung cancer and prostate cancer   . Breast cancer Cousin   . Cancer Cousin     4 paternal and 1 maternal cousins had breast cancer   . Colon cancer Neg Hx   . Esophageal cancer Neg Hx   . Stomach cancer Neg Hx   . Rectal cancer Neg Hx     ALLERGIES:  is allergic to crestor [rosuvastatin].  MEDICATIONS:  Current Outpatient Prescriptions  Medication Sig Dispense Refill  . ALPRAZolam (XANAX) 0.25 MG tablet Take 0.125 mg by mouth daily as needed for anxiety.    Marland Kitchen atenolol (TENORMIN) 50 MG tablet Take 1 tablet by mouth  daily 90 tablet 2  . candesartan (ATACAND) 32 MG tablet Take 1 tablet by mouth  daily 90 tablet 3  . cetirizine (ZYRTEC ALLERGY) 10 MG tablet Take 5 mg by mouth daily as needed for allergies.    Marland Kitchen loperamide (IMODIUM) 2 MG capsule States taking 1/2 tab daily    . Multiple Vitamin (MULTIVITAMIN) tablet Take 1 tablet by mouth daily.    . Multiple Vitamins-Minerals (MULTIVITAMIN ADULT PO) Take 1 tablet by mouth daily.    . Omega-3 Fatty Acids (FISH OIL PO) Take 1 tablet by mouth daily.    . Probiotic Product (PROBIOTIC PO) Take 1 tablet by mouth daily.    . ranitidine (ZANTAC) 150 MG tablet Take 150 mg by mouth daily as needed for heartburn.    . sertraline (ZOLOFT) 100 MG tablet Take 100 mg by mouth daily.      Current Facility-Administered Medications  Medication Dose Route Frequency Provider Last Rate Last Dose  . 0.9 %  sodium chloride infusion  500 mL Intravenous Continuous Milus Banister, MD        REVIEW OF SYSTEMS:   Constitutional: Denies fevers, chills or abnormal night sweats Eyes: Denies blurriness of vision, double vision or  watery eyes Ears, nose, mouth, throat, and face: Denies mucositis or sore throat Respiratory: Denies cough, dyspnea or wheezes Cardiovascular: Denies palpitation, chest discomfort or lower extremity swelling Gastrointestinal:  Denies nausea, heartburn. (+) Intermittent diarrhea (+) Pain while passing stool. Skin: Denies abnormal skin rashes Lymphatics: Denies new lymphadenopathy or easy bruising Neurological:Denies numbness, tingling or new weaknesses Behavioral/Psych: Mood is stable, no new changes  All other systems were reviewed with the patient and are negative.  PHYSICAL EXAMINATION: ECOG PERFORMANCE STATUS: 0  Vitals:   03/18/16 1019  BP: (!) 168/75  Pulse: 67  Resp: 18  Temp: 97.6 F (36.4 C)   Filed Weights   03/18/16 1019  Weight: 141 lb 12.8 oz (64.3 kg)    GENERAL:alert, no distress and comfortable SKIN: skin color, texture, turgor are normal, no rashes or significant lesions EYES: normal, conjunctiva are pink and non-injected, sclera clear OROPHARYNX:no exudate, no erythema and lips, buccal mucosa, and tongue normal  NECK: supple, thyroid normal size, non-tender, without nodularity LYMPH:  no palpable lymphadenopathy in the cervical, axillary or inguinal area. LUNGS: clear to auscultation and percussion with normal breathing effort HEART: regular rate & rhythm and no murmurs and no lower extremity edema ABDOMEN:abdomen soft, non-tender and normal bowel sounds. Rectal exam was deferred today. PSYCH: alert & oriented x 3 with fluent speech NEURO: no focal motor/sensory deficits  LABORATORY DATA:  CBC Latest Ref Rng & Units 02/29/2016 11/02/2015 06/29/2015  WBC 3.9 - 10.3 10e3/uL 5.8 6.1 5.3  Hemoglobin 11.6 - 15.9 g/dL 14.7 13.6 14.2  Hematocrit 34.8 - 46.6 % 43.1 40.4 41.3  Platelets 145 - 400 10e3/uL 178 204 162    CMP Latest Ref Rng & Units 02/29/2016 11/02/2015 06/29/2015  Glucose 70 - 140 mg/dl 72 100 108  BUN 7.0 - 26.0 mg/dL 8.8 9.7 10.1  Creatinine 0.6  - 1.1 mg/dL 0.8 0.7 0.8  Sodium 136 - 145 mEq/L 137 137 137  Potassium 3.5 - 5.1 mEq/L 4.0 4.1 4.2  Chloride  101 - 111 mmol/L - - -  CO2 22 - 29 mEq/L 27 27 29   Calcium 8.4 - 10.4 mg/dL 9.4 9.4 9.4  Total Protein 6.4 - 8.3 g/dL 7.0 6.6 6.6  Total Bilirubin 0.20 - 1.20 mg/dL 0.41 0.32 0.38  Alkaline Phos 40 - 150 U/L 67 54 56  AST 5 - 34 U/L 21 21 20   ALT 0 - 55 U/L 15 13 12     PATHOLOGY REPORT: Diagnosis 08/01/2014  1. Colon, biopsy, ascending and cecum, polyp (7) - TUBULAR ADENOMAS (7). NO HIGH GRADE DYSPLASIA OR MALIGNANCY IDENTIFIED. 2. Colon, biopsy, hepatic flexure, polyp (3) - TUBULAR ADENOMAS (3). NO HIGH GRADE DYSPLASIA OR MALIGNANCY IDENTIFIED. 3. Colon, biopsy, transverse - TWO FRAGMENTS OF TUBULAR ADENOMA. NO HIGH GRADE DYSPLASIA OR MALIGNANCY IDENTIFIED. 4. Colon, biopsy, descending, sigmoid - TUBULAR ADENOMAS (3). NO HIGH GRADE DYSPLASIA OR MALIGNANCY IDENTIFIED. 5. Rectum, biopsy, distal mass - SQUAMOUS CELL CARCINOMA, BASALOID TYPE. - SEE MICROSCOPIC DESCRIPTION.  Colon, polyp(s), distal ascending 08/18/2014 - MULTIPLE FRAGMENTS OF TUBULAR ADENOMA. - NO HIGH GRADE DYSPLASIA OR MALIGNANCY.  Colonoscopy Pathology: Diagnosis 11/10/2015 Surgical [P], ascendingx3, transversex1, polyp (4) - TUBULAR ADENOMA (X3 FRAGMENTS). - BENIGN COLONIC MUCOSA (MULTIPLE FRAGMENTS). - NO HIGH GRADE DYSPLASIA OR MALIGNANCY.  RADIOGRAPHIC STUDIES:  PET 03/06/2016 IMPRESSION: Negative PET scan for residual, recurrent or metastatic disease.  PET 02/23/2015 IMPRESSION: 1. Interval response to therapy (SUV 4.49. Previously 7.9.). 2. Decrease in FDG uptake associated with anal tumor. 3. No new or progressive disease identified within the chest, abdomen or pelvis.  PROCEDURES Colonoscopy 11/10/2015 - Three 4 to 5 mm polyps in the ascending colon, removed with a hot snare. Resected and retrieved. - One 4 mm polyp in the transverse colon, removed with a cold snare. Resected  and retrieved. - The examination was otherwise normal on direct and retroflexion views.   COLONOSCOPY 08/18/2014 ENDOSCOPIC IMPRESSION: In the distal ascending colon, near the hepatic flexure the two sessile polyps were again located. One was 1cm, the other about 1.7cm along a fold. Neither now appeared cancerous. Previous tatoo at the site was somewhat visible. With external pressure and manipulation of the colon by Dr. Marcello Moores using laparscopic technique I was able to resect the polyps. I used saline pressure injector to lift the polyps and then piecemeal snare/cautery technique. I felt there was a small amount of remaining polyp tissue at the site and I used snare tip cautery to cauterize it. Three other small (2-43mm) clearly adenomatous polyps were removed from the right colon (not retrieved). The examination was otherwise normal RECOMMENDATIONS: Await final pathology, likely you will need repeat colonoscopy in 1 year.  Colonoscopy 08/01/2014 They were adjacent to eachother. They were located on both sides of a fold, sessile, slightly firm. The largest of the two (19mm) was biopsied and then the site was labled with SPOT via submucosal injection. These biopsies were in jar #3. The other 12 polyp were all sessile, located in cecum, ascending, hepatic flexure, descending and sigmoid. Two were removed with snare cautery (ascending and sigmoid), and the rest removed with cold snare. Sent in three different jars (#1 ascending, cecum), (#2hepatic flexure), (#4 descending, sigmoid). There was also an ulcerated, malignant appearing distal rectal mass that was adjacent to the internal anal sphincter. This was non-cirumferential, 3-5cm across, friable, biopsied extensively (#5). There were 8-10 diminutive polyps spread throughout the colon that I did not remove (2-22mm each). Retroflexed views revealed no abnormalities. The time to cecum = 70min Withdrawal time = 60min  The scope was withdrawn and the procedure  completed. COMPLICATIONS: There were no immediate complications. ENDOSCOPIC IMPRESSION: Multiple sites of significant neoplasia, see above  ASSESSMENT & PLAN: 71 y.o. Caucasian female with past medical history of hypertension, anxiety and depression, mild arthritis, who presents with watery diarrhea for 4 months.  1. Rectal squamous cell carcinoma, [id number], stage I  -I previously reviewed her colonoscopy findings, the rectal mass biopsy results with patient and her son in great details. -We previously reviewed the natural history of rectal cancer, and staging workup. CT scans reviewed no local adenopathy or distant metastasis. she early stage rectal cancer. -She completed her concurrent chemoradiation, with 2 cycles of full doses of FU and mitomycin. The goal of treatment was curative -I previously reviewed her restaging PET scan from 02/2015, which showed interval response and decreased FDG uptake in the and no mass. No other evidence of recurrence. We previously discussed that the residual hypermetabolic activity is possible related to her radiation, less likely residual tumor. -Colonoscopy on 11/10/15, revealed three 4-5 mm polyps in the ascending colon and these were removed. Pathology revealed no high grade dysplasia or malignancy. -I reviewed her restaging PET scan from 03/06/16, and this showed no residual, recurrent, or metastatic disease. -She is currently doing well, lab results reviewed with her, normal CBC and CMP, no evidence of recurrence. Rectal exam deferred during this encounter. --We will continue cancer surveillance. Will repeat scan in one year   2. Smoking cessation -I strongly encouraged her to stop smoking, she is willing to cut back.  3. Hypertension, anxiety -She'll continue follow-up with her primary care physician  4. Loose stools -Managed with imodium -Advised the patient that too much imodium could cause constipation. The patient is aware of this.  PLAN -She  will see Dr. Marcello Moores, her surgeon, in early February and every 3 months. -RTC in 6 months with lab.  All questions were answered. The patient knows to call the clinic with any problems, questions or concerns.  I spent 20 minutes counseling the patient face to face. The total time spent in the appointment was 25 minutes and more than 50% was on counseling.     Truitt Merle, MD 03/18/2016 10:57 AM   This document serves as a record of services personally performed by Truitt Merle, MD. It was created on her behalf by Darcus Austin, a trained medical scribe. The creation of this record is based on the scribe's personal observations and the provider's statements to them. This document has been checked and approved by the attending provider.

## 2016-03-18 ENCOUNTER — Ambulatory Visit (HOSPITAL_BASED_OUTPATIENT_CLINIC_OR_DEPARTMENT_OTHER): Payer: Medicare Other | Admitting: Hematology

## 2016-03-18 ENCOUNTER — Encounter: Payer: Self-pay | Admitting: Hematology

## 2016-03-18 VITALS — BP 168/75 | HR 67 | Temp 97.6°F | Resp 18 | Wt 141.8 lb

## 2016-03-18 DIAGNOSIS — C20 Malignant neoplasm of rectum: Secondary | ICD-10-CM

## 2016-03-18 DIAGNOSIS — R197 Diarrhea, unspecified: Secondary | ICD-10-CM | POA: Diagnosis not present

## 2016-03-18 DIAGNOSIS — Z720 Tobacco use: Secondary | ICD-10-CM

## 2016-03-18 DIAGNOSIS — I10 Essential (primary) hypertension: Secondary | ICD-10-CM

## 2016-03-19 ENCOUNTER — Telehealth: Payer: Self-pay | Admitting: Hematology

## 2016-03-19 NOTE — Telephone Encounter (Signed)
Left message for patient re July lab/fu and mailed schedule.

## 2016-03-29 ENCOUNTER — Other Ambulatory Visit: Payer: Self-pay | Admitting: Nurse Practitioner

## 2016-05-09 ENCOUNTER — Ambulatory Visit (INDEPENDENT_AMBULATORY_CARE_PROVIDER_SITE_OTHER): Payer: Medicare Other | Admitting: Adult Health

## 2016-05-09 ENCOUNTER — Encounter: Payer: Self-pay | Admitting: Adult Health

## 2016-05-09 VITALS — BP 147/77 | HR 80 | Ht 63.0 in | Wt 141.2 lb

## 2016-05-09 DIAGNOSIS — B354 Tinea corporis: Secondary | ICD-10-CM | POA: Diagnosis not present

## 2016-05-09 DIAGNOSIS — M79672 Pain in left foot: Secondary | ICD-10-CM | POA: Diagnosis not present

## 2016-05-09 DIAGNOSIS — C20 Malignant neoplasm of rectum: Secondary | ICD-10-CM

## 2016-05-09 DIAGNOSIS — E7849 Other hyperlipidemia: Secondary | ICD-10-CM

## 2016-05-09 DIAGNOSIS — R5383 Other fatigue: Secondary | ICD-10-CM | POA: Diagnosis not present

## 2016-05-09 DIAGNOSIS — Z23 Encounter for immunization: Secondary | ICD-10-CM | POA: Diagnosis not present

## 2016-05-09 DIAGNOSIS — F419 Anxiety disorder, unspecified: Secondary | ICD-10-CM

## 2016-05-09 DIAGNOSIS — I10 Essential (primary) hypertension: Secondary | ICD-10-CM

## 2016-05-09 DIAGNOSIS — M79671 Pain in right foot: Secondary | ICD-10-CM | POA: Insufficient documentation

## 2016-05-09 DIAGNOSIS — Z1159 Encounter for screening for other viral diseases: Secondary | ICD-10-CM | POA: Diagnosis not present

## 2016-05-09 DIAGNOSIS — M25551 Pain in right hip: Secondary | ICD-10-CM

## 2016-05-09 DIAGNOSIS — M25552 Pain in left hip: Secondary | ICD-10-CM

## 2016-05-09 DIAGNOSIS — E784 Other hyperlipidemia: Secondary | ICD-10-CM

## 2016-05-09 MED ORDER — KETOCONAZOLE 2 % EX SHAM: 1.0000 "application " | MEDICATED_SHAMPOO | CUTANEOUS | 1 refills | Status: DC

## 2016-05-09 NOTE — Assessment & Plan Note (Signed)
Remove sweaty clothes immediately after working out. Ketoconazole shampoo as directed.

## 2016-05-09 NOTE — Assessment & Plan Note (Signed)
Continue Atenolol as directed. Reduce-quit tobacco use.

## 2016-05-09 NOTE — Assessment & Plan Note (Signed)
Will check Vit d level and TSH ASAP.

## 2016-05-09 NOTE — Assessment & Plan Note (Signed)
Continue excellent regular movement- 60 mins of walking daily! OTC Acetaminophen as directed by manufacturer.

## 2016-05-09 NOTE — Patient Instructions (Signed)
Healthy Eating to Prevent Digestive Disorders The digestive system starts at the mouth and goes all the way down to the rectum. Along the way, your digestive system breaks down the food you eat so you can absorb its nutrients and use them for energy. Digestive disorders can cause gas, bloating, pain, heartburn, and other symptoms. They can prevent your digestive system from doing its job. Healthy eating and a healthy lifestyle can help you avoid many common digestive disorders. What nutrition changes can be made? Start by eating a balanced diet. Eat healthy foods from all the major food groups. These include carbohydrates, fats, and proteins. Other changes you can make include to:  Eat enough fiber. Fiber is a healthy carbohydrate that cleans out your digestive system. Fiber absorbs water and helps you have regular bowel movements. Fiber comes from plants. To get enough fiber in your diet, eat 4-5 servings of fruits, vegetables, and legumes every day. Include beans and whole grains. Most people should get 20?35 grams of fiber each day.  Drink enough water to keep your urine clear or pale yellow. Water helps your body digest food. It can also help prevent constipation.  Avoid fatty proteins. Full-fat dairy products and fatty meats are hard to digest. Fats you want to avoid are those that get solid at room temperature (saturated fats). Instead of eating these kinds of fats, eat plant-based unsaturated fats found in olives, canola, corn, avocado, and nuts.  If you have trouble with gas, belching, or flatulence, avoid gas-producing foods. These include beans, carbonated beverages, cabbage, cauliflower, and broccoli. If you are lactose intolerant, avoid dairy products or choose lactose-free dairy products.  If you have frequent heartburn, stay away from alcohol, caffeine, fatty foods, chocolate, and peppermint. Avoid lying down within two hours of eating a full meal. Overeating and lying down too soon after  a meal can cause heartburn.  Add probiotics to your diet. Healthy digestion depends on having the right balance of good bacteria in your colon. Probiotics can help restore the balance of good bacteria in your digestive system. Probiotics are live active cultures that are found in yogurt, kefir, and cultured foods like sauerkraut and miso. You can also add good bacteria with probiotic supplements.  Make sure to chew your food slowly and completely.  Instead of eating three large meals each day, eat three small meals with three small snacks. What other changes can I make? You can help your digestive system stay healthy by making these lifestyle changes:  Stay active and exercise every day.  Maintain a healthy weight.  Eat on a regular schedule.  Avoid tight-fitting clothes. They can restrict digestion.  If you have frequent heartburn, raise the head of your bed 2-3 inches (5-7.5 cm).  Do not use any tobacco products, such as cigarettes, chewing tobacco, and e-cigarettes. If you need help quitting, ask your health care provider.  Limit alcohol intake to no more than 1 drink a day for nonpregnant women and 2 drinks a day for men. One drink equals 12 oz of beer, 5 oz of wine, or 1 oz of hard liquor.  Avoid stress. Find ways to reduce stress, such as meditation, exercise, or taking time for activities that relax you. Why should I make these changes? Making these changes will help your digestive system function at its best. A healthy digestive system can help you avoid or improve your management of digestive disorders such as:  Bloating, gas, and flatulence.  Heartburn.  Gastroesophageal reflux disease (GERD).  Peptic ulcer disease.  Hemorrhoids.  Diverticulitis.  Constipation.  Diarrhea.  Gall stones  Irritable bowel syndrome.  Malnutrition.  Fatty liver disease. What can happen if changes are not made? Not making these changes could put you at risk for many conditions  caused by a poor diet or an unhealthy weight, such as heart disease, stroke and diabetes. Where can I get more information? Learn more about healthy eating and digestive disorders by visiting these websites:  Academy of Nutrition and Dietetics: DenimDistribution.com.ee  Centers for Disease Control and Prevention: CoinSpecialists.co.za  U.S. Department of Health and Human Services: [place]CarWash.com.cy.pdf Summary  A heathy diet can help prevent many digestive disorders.  Eat a balanced diet consisting of fiber, unsaturated fats, lean protein, fruits, and vegetables.  Eat three small meals with three small snacks per day.  Drink plenty of water every day.  Get plenty of exercise and maintain a healthy weight. This information is not intended to replace advice given to you by your health care provider. Make sure you discuss any questions you have with your health care provider. Document Released: 03/24/2015 Document Revised: 08/03/2015 Document Reviewed: 11/08/2015 Elsevier Interactive Patient Education  2017 [place] versicolor is a common fungal infection of the skin. It causes a rash that appears as light or dark patches on the skin. The rash most often occurs on the chest, back, neck, or upper arms. This condition is more common during warm weather. Other than affecting how your skin looks, tinea versicolor usually does not cause other problems. In most cases, the infection goes away in a few weeks with treatment. It may take a few months for the patches on your skin to clear up. What are the causes? Tinea versicolor occurs when a type of fungus that is normally present on the skin starts to overgrow. This fungus is a kind of yeast. The exact cause of the overgrowth is not known. This condition cannot be passed from one person to  another (noncontagious). What increases the risk? This condition is more likely to develop when certain factors are present, such as:  Heat and humidity.  Sweating too much.  Hormone changes.  Oily skin.  A weak defense (immune) system. What are the signs or symptoms? Symptoms of this condition may include:  A rash on your skin that is made up of light or dark patches. The rash may have:  Patches of tan or pink spots on light skin.  Patches of white or brown spots on dark skin.  Patches of skin that do not tan.  Well-marked edges.  Scales on the discolored areas.  Mild itching. How is this diagnosed? A health care provider can usually diagnose this condition by looking at your skin. During the exam, he or she may use ultraviolet light to help determine the extent of the infection. In some cases, a skin sample may be taken by scraping the rash. This sample will be viewed under a microscope to check for yeast overgrowth. How is this treated? Treatment for this condition may include:  Dandruff shampoo that is applied to the affected skin during showers or bathing.  Over-the-counter medicated skin cream, lotion, or soaps.  Prescription antifungal medicine in the form of skin cream or pills.  Medicine to help reduce itching. Follow these instructions at home:  Take medicines only as directed by your health care provider.  Apply dandruff shampoo to the affected area if told to do so by your health care provider. You  may be instructed to scrub the affected skin for several minutes each day.  Do not scratch the affected area of skin.  Avoid hot and humid conditions.  Do not use tanning booths.  Try to avoid sweating a lot. Contact a health care provider if:  Your symptoms get worse.  You have a fever.  You have redness, swelling, or pain at the site of your rash.  You have fluid, blood, or pus coming from your rash.  Your rash returns after treatment. This  information is not intended to replace advice given to you by your health care provider. Make sure you discuss any questions you have with your health care provider. Document Released: 02/23/2000 Document Revised: 10/29/2015 Document Reviewed: 12/07/2013 Elsevier Interactive Patient Education  2017 [place] all medications as directed. Increase daily water intake, at least 70 ounces/day. Please schedule fasting lab nurse visit at your convenience. Continue with GI Team as directed. Annual follow-up, sooner if needed.

## 2016-05-09 NOTE — Assessment & Plan Note (Signed)
Continue with GI Team, now every 6 months. Sept 2017-Colonoscopy: negative Dec 2017- PET Scan: negative. No current GI sx's.

## 2016-05-09 NOTE — Assessment & Plan Note (Signed)
-

## 2016-05-09 NOTE — Assessment & Plan Note (Signed)
Will check lipids ASAP. Last dose of statin was 2008. Crestor gave her "horrible leg cramps".

## 2016-05-09 NOTE — Progress Notes (Signed)
Subjective:    Patient ID: Glenda Stevens, female    DOB: 01-Dec-1945, 71 y.o.   MRN: [id number]  HPI:  Glenda Stevens is here to establish as a new pt.  She is a very, very pleasant 71 year old woman.  PMH:  Renal artery stenosis, hyperlipidemia, HTN, tobacco use (pack/day for > 30 years), rectal ca (remission now, followed by [facility]), Anxiety, bil hip/foot pain (4/10-6/10 each morning that improves with daily walking), and "rash on chest" that developed last summer.  She is compliant on all medications and denies SE.  She declined smoking cessation today.  She lives with her adult son and is "very content in life"  She has a dog, "Mr. [name]" whom she walks 60 mins daily-GREAT!   Patient Care Team    Relationship Specialty Notifications Start End  Odella Aquas, NP PCP - General Family Medicine  [id number]   Leighton Ruff, MD Consulting Physician General Surgery  10/24/14   Kyung Rudd, MD Consulting Physician Radiation Oncology  10/24/14     Patient Active Problem List   Diagnosis Date Noted  . Other fatigue 05/09/2016  . Bilateral hip pain 05/09/2016  . Tinea corporis 05/09/2016  . Foot pain, bilateral 05/09/2016  . Medicare annual wellness visit, initial 07/06/2015  . Anxiety 07/06/2015  . Rectal cancer ([place]) 08/01/2014  . Diarrhea 07/08/2014  . Seborrheic keratoses 03/18/2014  . Nasal congestion 09/14/2013  . Smoker 09/14/2013  . Renal artery stenosis ([place]) 08/06/2013  . Hyperlipidemia 08/06/2013  . Essential hypertension 08/06/2013     Past Medical History:  Diagnosis Date  . Allergy    Hay fever  . Anxiety   . Arthritis   . 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 GERD (gastroesophageal reflux disease)   . History of chicken pox   . Hyperlipidemia   . Hypertension   . Phlebitis and thrombophlebitis   . PONV (postoperative nausea and vomiting)   . Renal artery stenosis ([facility])    left stent in 2003  . Renovascular hypertension   . S/P radiation therapy  09/05/14-10/19/14   anal ca,50.4Gy     Past Surgical History:  Procedure Laterality Date  . ABDOMINAL HYSTERECTOMY    . BREAST SURGERY     bilateral lumpectomy-benign  . CATARACT EXTRACTION Bilateral   . COLON RESECTION N/A 08/18/2014   Procedure: LAPAROSCOPIC ASSISTED  COLON POLYP RESECTION , DIAGNOSTIC LAPROSCOPY ;  Surgeon: Leighton Ruff, MD;  Location: [facility];  Service: General;  Laterality: N/A;  . NASAL SINUS SURGERY     patient denies having had nasal sinus surgery  . NM MYOCAR PERF WALL MOTION  09/2009   bruce myoview - normal pattern of perfusion, EF 83%, low risk scan  . RENAL ARTERY ANGIOPLASTY  07/02/2001   6x45mm Genesis on Aviator balloon stent overlapping a 6x67mm Genesist on Aviator balloon stent (Dr. Marella Chimes)  . RENAL ARTERY STENT    . RENAL DOPPLER  09/2012   left renal artery stent - 60-99% diameter reduction  . TRANSTHORACIC ECHOCARDIOGRAM  06/2012   EF 55-60%, mild MR     Family History  Problem Relation Age of Onset  . Hypertension Mother   . Heart disease Mother   . Dementia Mother   . Hyperlipidemia Mother   . Heart disease Father   . Hyperlipidemia Father   . Cancer Father     lung cancer and prostate cancer   . Hypertension Father   . Breast  cancer Cousin   . Cancer Cousin     4 paternal and 1 maternal cousins had breast cancer   . Healthy Brother   . Colon cancer Neg Hx   . Esophageal cancer Neg Hx   . Stomach cancer Neg Hx   . Rectal cancer Neg Hx      History  Drug Use No     History  Alcohol Use  . 4.2 oz/week  . 7 Glasses of wine per week     History  Smoking Status  . Current Some Day Smoker  . Packs/day: 1.00  . Years: 30.00  . Types: Cigarettes  Smokeless Tobacco  . Never Used     Outpatient Encounter Prescriptions as of 05/09/2016  Medication Sig  . ALPRAZolam (XANAX) 0.25 MG tablet Take 0.125 mg by mouth daily as needed for anxiety.  Marland Kitchen aspirin EC 81 MG tablet Take 81 mg by mouth daily.  Marland Kitchen atenolol (TENORMIN) 50  MG tablet Take 1 tablet by mouth  daily  . candesartan (ATACAND) 32 MG tablet Take 1 tablet by mouth  daily  . loperamide (IMODIUM) 2 MG capsule States taking 1/2 tab daily  . Multiple Vitamin (MULTIVITAMIN) tablet Take 1 tablet by mouth daily.  . Omega-3 Fatty Acids (FISH OIL PO) Take 1 tablet by mouth daily.  . Probiotic Product (PROBIOTIC PO) Take 1 tablet by mouth daily.  . ranitidine (ZANTAC) 150 MG tablet Take 150 mg by mouth daily as needed for heartburn.  . sertraline (ZOLOFT) 100 MG tablet Take 100 mg by mouth daily.   Marland Kitchen ketoconazole (NIZORAL) 2 % shampoo Apply 1 application topically 2 (two) times a week.  . [DISCONTINUED] cetirizine (ZYRTEC ALLERGY) 10 MG tablet Take 5 mg by mouth daily as needed for allergies.  . [DISCONTINUED] Multiple Vitamins-Minerals (MULTIVITAMIN ADULT PO) Take 1 tablet by mouth daily.   Facility-Administered Encounter Medications as of 05/09/2016  Medication  . 0.9 %  sodium chloride infusion    Allergies: Crestor [rosuvastatin]  Body mass index is 25.01 kg/m.  Blood pressure (!) 147/77, pulse 80, height 5\' 3"  (1.6 m), weight 141 lb 3.2 oz (64 kg).     Review of Systems  Constitutional: Negative for activity change, appetite change, chills, diaphoresis, fatigue, fever and unexpected weight change.  HENT: Negative for congestion.   Eyes: Negative for visual disturbance.  Respiratory: Negative for cough, shortness of breath, wheezing and stridor.   Cardiovascular: Negative for chest pain, palpitations and leg swelling.  Gastrointestinal: Positive for constipation and diarrhea. Negative for abdominal distention, abdominal pain, anal bleeding, nausea, rectal pain and vomiting.       Ever since the rectal ca-she combination diarrhea/constipation that "changes everyday".  Endocrine: Negative for cold intolerance, heat intolerance, polydipsia, polyphagia and polyuria.  Genitourinary: Negative for difficulty urinating, dysuria and flank pain.   Musculoskeletal: Positive for arthralgias and myalgias. Negative for back pain, gait problem, joint swelling, neck pain and neck stiffness.       BIl hip/foot pain that is worse in the am and improves with movement throughout the day. She has never seen Ortho.  She does not use OTC pain medications (i.e. Acetaminophen, Ibuprofen).  Skin: Negative for color change, pallor, rash and wound.  Neurological: Negative for dizziness, tremors, weakness, light-headedness and headaches.  Psychiatric/Behavioral: Negative for agitation, behavioral problems, confusion, self-injury, sleep disturbance and suicidal ideas. The patient is nervous/anxious.        Objective:   Physical Exam  Constitutional: She is oriented to person, place,  and time. She appears well-developed and well-nourished. No distress.  HENT:  Head: Normocephalic and atraumatic.  Right Ear: External ear normal.  Left Ear: External ear normal.  Eyes: Conjunctivae and EOM are normal. Pupils are equal, round, and reactive to light.  Neck: Normal range of motion. Neck supple.  Cardiovascular: Normal rate, regular rhythm, normal heart sounds and intact distal pulses.   Pulmonary/Chest: Effort normal and breath sounds normal. No respiratory distress. She has no wheezes. She has no rales. She exhibits no tenderness.  Abdominal: Soft. Bowel sounds are normal. She exhibits no distension and no mass. There is no tenderness. There is no rebound and no guarding.  Musculoskeletal: Normal range of motion. She exhibits tenderness.       Right hip: She exhibits tenderness. She exhibits normal range of motion and normal strength.       Left hip: She exhibits tenderness. She exhibits normal range of motion and normal strength.       Right ankle: She exhibits normal range of motion, no swelling and normal pulse. Tenderness. Achilles tendon exhibits no pain.       Left ankle: She exhibits normal range of motion, no swelling and normal pulse. Tenderness.  Achilles tendon exhibits no pain.  Lymphadenopathy:    She has no cervical adenopathy.  Neurological: She is alert and oriented to person, place, and time. She has normal reflexes.  Skin: Skin is warm and dry. Rash noted. She is not diaphoretic. No erythema. No pallor.  Psychiatric: She has a normal mood and affect. Her behavior is normal. Judgment and thought content normal.  Nursing note and vitals reviewed.         Assessment & Plan:   1. Other hyperlipidemia   2. Essential hypertension   3. Rectal cancer ([place])   4. Need for Tdap vaccination   5. Encounter for hepatitis C screening test for low risk patient   6. Other fatigue   7. Anxiety   8. Bilateral hip pain   9. Tinea corporis   10. Foot pain, bilateral     Essential hypertension Continue Atenolol as directed. Reduce-quit tobacco use.  Rectal cancer ([place]) Continue with GI Team, now every 6 months. Sept 2017-Colonoscopy: negative Dec 2017- PET Scan: negative. No current GI sx's.  Tinea corporis Remove sweaty clothes immediately after working out. Ketoconazole shampoo as directed.  Hyperlipidemia Will check lipids ASAP. Last dose of statin was 2008. Crestor gave her "horrible leg cramps".  Anxiety Continue alprazolam as needed.   Other fatigue Will check Vit d level and TSH ASAP.  Bilateral hip pain Continue excellent regular movement- 60 mins of walking daily! OTC Acetaminophen as directed by manufacturer.  Foot pain, bilateral Continue excellent regular movement- 60 mins of walking daily! OTC Acetaminophen as directed by manufacturer.    FOLLOW-UP:  Return in about 1 year (around 05/09/2017) for Regular Follow Up, HTN.

## 2016-05-13 ENCOUNTER — Other Ambulatory Visit: Payer: Self-pay | Admitting: Family Medicine

## 2016-05-13 NOTE — Telephone Encounter (Signed)
BP readings at various providers > 130/80 consistently.

## 2016-05-17 ENCOUNTER — Other Ambulatory Visit (INDEPENDENT_AMBULATORY_CARE_PROVIDER_SITE_OTHER): Payer: Medicare Other

## 2016-05-17 DIAGNOSIS — E7849 Other hyperlipidemia: Secondary | ICD-10-CM

## 2016-05-17 DIAGNOSIS — I10 Essential (primary) hypertension: Secondary | ICD-10-CM

## 2016-05-17 DIAGNOSIS — Z1159 Encounter for screening for other viral diseases: Secondary | ICD-10-CM

## 2016-05-17 DIAGNOSIS — R5383 Other fatigue: Secondary | ICD-10-CM

## 2016-05-18 LAB — COMPREHENSIVE METABOLIC PANEL
A/G RATIO: 2 (ref 1.2–2.2)
ALT: 13 IU/L (ref 0–32)
AST: 19 IU/L (ref 0–40)
Albumin: 4.3 g/dL (ref 3.5–4.8)
Alkaline Phosphatase: 56 IU/L (ref 39–117)
BILIRUBIN TOTAL: 0.3 mg/dL (ref 0.0–1.2)
BUN / CREAT RATIO: 12 (ref 12–28)
BUN: 8 mg/dL (ref 8–27)
CHLORIDE: 98 mmol/L (ref 96–106)
CO2: 27 mmol/L (ref 18–29)
Calcium: 9.3 mg/dL (ref 8.7–10.3)
Creatinine, Ser: 0.67 mg/dL (ref 0.57–1.00)
GFR calc Af Amer: 102 mL/min/{1.73_m2} (ref >59)
GFR calc non Af Amer: 88 mL/min/{1.73_m2} (ref >59)
GLOBULIN, TOTAL: 2.2 g/dL (ref 1.5–4.5)
Glucose: 96 mg/dL (ref 65–99)
POTASSIUM: 4.2 mmol/L (ref 3.5–5.2)
SODIUM: 140 mmol/L (ref 134–144)
Total Protein: 6.5 g/dL (ref 6.0–8.5)

## 2016-05-18 LAB — CBC WITH DIFFERENTIAL/PLATELET
Basophils Absolute: 0 10*3/uL (ref 0.0–0.2)
Basos: 0 %
EOS (ABSOLUTE): 0.1 10*3/uL (ref 0.0–0.4)
EOS: 3 %
HEMATOCRIT: 46.7 % — AB (ref 34.0–46.6)
Hemoglobin: 15.7 g/dL (ref 11.1–15.9)
Immature Grans (Abs): 0 10*3/uL (ref 0.0–0.1)
Immature Granulocytes: 0 %
LYMPHS ABS: 1 10*3/uL (ref 0.7–3.1)
Lymphs: 18 %
MCH: 32 pg (ref 26.6–33.0)
MCHC: 33.6 g/dL (ref 31.5–35.7)
MCV: 95 fL (ref 79–97)
Monocytes Absolute: 0.5 10*3/uL (ref 0.1–0.9)
Monocytes: 9 %
NEUTROS ABS: 3.8 10*3/uL (ref 1.4–7.0)
Neutrophils: 70 %
Platelets: 195 10*3/uL (ref 150–379)
RBC: 4.91 x10E6/uL (ref 3.77–5.28)
RDW: 13.6 % (ref 12.3–15.4)
WBC: 5.4 10*3/uL (ref 3.4–10.8)

## 2016-05-18 LAB — LIPID PANEL
CHOL/HDL RATIO: 5.9 ratio — AB (ref 0.0–4.4)
Cholesterol, Total: 311 mg/dL — ABNORMAL HIGH (ref 100–199)
HDL: 53 mg/dL (ref >39)
LDL Calculated: 221 mg/dL — ABNORMAL HIGH (ref 0–99)
Triglycerides: 186 mg/dL — ABNORMAL HIGH (ref 0–149)
VLDL Cholesterol Cal: 37 mg/dL (ref 5–40)

## 2016-05-18 LAB — VITAMIN D 25 HYDROXY (VIT D DEFICIENCY, FRACTURES): VIT D 25 HYDROXY: 16.5 ng/mL — AB (ref 30.0–100.0)

## 2016-05-18 LAB — HEMOGLOBIN A1C
ESTIMATED AVERAGE GLUCOSE: 105 mg/dL
HEMOGLOBIN A1C: 5.3 % (ref 4.8–5.6)

## 2016-05-18 LAB — TSH: TSH: 4.12 u[IU]/mL (ref 0.450–4.500)

## 2016-05-18 LAB — HEPATITIS C ANTIBODY: Hep C Virus Ab: <0.1 s/co ratio (ref 0.0–0.9)

## 2016-05-18 LAB — VITAMIN B12: Vitamin B-12: 758 pg/mL (ref 232–1245)

## 2016-05-20 ENCOUNTER — Other Ambulatory Visit: Payer: Self-pay | Admitting: Adult Health

## 2016-05-20 DIAGNOSIS — E559 Vitamin D deficiency, unspecified: Secondary | ICD-10-CM

## 2016-05-20 DIAGNOSIS — E7849 Other hyperlipidemia: Secondary | ICD-10-CM

## 2016-05-20 MED ORDER — VITAMIN D (ERGOCALCIFEROL) 1.25 MG (50000 UNIT) PO CAPS: 50000.0000 [IU] | ORAL_CAPSULE | ORAL | 0 refills | Status: DC

## 2016-05-20 MED ORDER — ATORVASTATIN CALCIUM 40 MG PO TABS: 40.0000 mg | ORAL_TABLET | Freq: Every day | ORAL | 1 refills | Status: DC

## 2016-05-20 NOTE — Progress Notes (Unsigned)
ergo 

## 2016-06-24 ENCOUNTER — Telehealth: Payer: Self-pay | Admitting: Family Medicine

## 2016-06-24 NOTE — Telephone Encounter (Signed)
Please see below.  I had made a note about following this up, but the imaging per Dr. Burr Medico should cover any imaging requirements for lung cancer screening.  Thanks.

## 2016-06-24 NOTE — Telephone Encounter (Signed)
Pt returned call , please call back   °

## 2016-06-24 NOTE — Telephone Encounter (Signed)
-----   Message from Truitt Merle, MD sent at 06/17/2016  8:40 AM EDT ----- I plan to repeat CT or PET at the end of this year, and will continue yearly for at least a few more years. I think that should cover lung cancer screening too.   Thanks  Krista Blue  ----- Message ----- From: Tonia Ghent, MD Sent: 06/16/2016  11:15 PM To: Truitt Merle, MD  There was a question previously about this patient going through with lung cancer screening.  Is this still reasonable to do, or will f/u imaging with your take the place, ie CT chest periodically?    Please let me know what you think.  Thanks.   Brigitte Pulse

## 2016-06-24 NOTE — Telephone Encounter (Signed)
Patient advised.

## 2016-06-24 NOTE — Telephone Encounter (Signed)
Left message on patient's voicemail to return call

## 2016-06-29 ENCOUNTER — Other Ambulatory Visit: Payer: Self-pay | Admitting: Adult Health

## 2016-07-01 MED ORDER — VITAMIN D (ERGOCALCIFEROL) 1.25 MG (50000 UNIT) PO CAPS: 50000.0000 [IU] | ORAL_CAPSULE | ORAL | 0 refills | Status: DC

## 2016-07-02 ENCOUNTER — Telehealth: Payer: Self-pay | Admitting: Adult Health

## 2016-07-02 NOTE — Telephone Encounter (Signed)
Patient wants to speak to someone clinical about her Vit D. She states that she wants to discuss how long she needs to stay on it and may need a refill based on the duration.

## 2016-07-02 NOTE — Telephone Encounter (Signed)
LVM for pt to call to discuss.  T. Nelson, CMA  

## 2016-07-03 NOTE — Telephone Encounter (Signed)
LVM for pt to call to discuss results.  T. Nelson, CMA  

## 2016-07-04 NOTE — Telephone Encounter (Signed)
Pt did not return any of my calls.  Encounter closed.  Charyl Bigger, CMA

## 2016-07-18 DIAGNOSIS — E559 Vitamin D deficiency, unspecified: Secondary | ICD-10-CM | POA: Insufficient documentation

## 2016-08-02 ENCOUNTER — Other Ambulatory Visit: Payer: Self-pay | Admitting: Adult Health

## 2016-08-02 MED ORDER — ATORVASTATIN CALCIUM 40 MG PO TABS: 40.0000 mg | ORAL_TABLET | Freq: Every day | ORAL | 3 refills | Status: DC

## 2016-08-02 NOTE — Telephone Encounter (Signed)
Patient is requesting a 90 day refill of the atorvastatin sent to [facility]

## 2016-08-02 NOTE — Addendum Note (Signed)
Addended by: Amado Coe on: 08/02/2016 03:59 PM   Modules accepted: Orders

## 2016-08-02 NOTE — Telephone Encounter (Signed)
Left message informing patient lipitor has been sent to [facility].

## 2016-09-16 NOTE — Progress Notes (Signed)
[facility]  [phone] [phone]  Clinic follow up Note   Patient Care Team: Esaw Grandchild, NP as PCP - General (Family Medicine) Leighton Ruff, MD as Consulting Physician (General Surgery) Kyung Rudd, MD as Consulting Physician (Radiation Oncology) 09/17/2016  CHIEF COMPLAINTS:  Follow up rectal squamous cell carcinoma  Oncology History   Presented with heme + stool, loose stools for 2 months     Rectal cancer ([place])   08/01/2014 Pathology Results    Rectum, biopsy, distal mass - SQUAMOUS CELL CARCINOMA, BASALOID TYPE      08/01/2014 Procedure    Colonoscopy-ulcerated, malignant appearing distal rectal mass adjacent to the internal anal sphincter. #14 polyps spread throughout the colon (#12 removed)      08/01/2014 Tumor Marker    CEA=5.3      08/01/2014 Initial Diagnosis    Rectal cancer      08/04/2014 Imaging    CT C/A/P-No evidence of metastatic disease in chest, abdomen, or pelvis      08/18/2014 Procedure    Colonoscopy and parotidectomy. Path reviewed tubular adenoma.      08/25/2014 Imaging    PET IMPRESSION: 1. Hypermetabolic activity at the anus corresponding with known squamous cell carcinoma. No evidence of metastatic disease. 2. No other suspicious metabolic activity. 3. Diffuse atherosclerosis.      09/05/2014 - 10/19/2014 Radiation Therapy    definitive XRT to 50.4 Gy with concurrent chemotherapy      09/05/2015 - 10/07/2015 Chemotherapy    Mitomycin 10mg /m2 on Day 1 and 28, 5-fu 1000mg /m2 on day 1-5  and day 28-32 (96 hours)      11/10/2015 Procedure    Colonoscopy 11/10/2015 - Three 4 to 5 mm polyps in the ascending colon, removed with a hot snare. Resected and retrieved. - One 4 mm polyp in the transverse colon, removed with a cold snare. Resected and retrieved. - The examination was otherwise normal on direct and retroflexion views.       11/10/2015 Pathology Results    Diagnosis 11/10/2015 Surgical [P],  ascendingx3, transversex1, polyp (4) - TUBULAR ADENOMA (X3 FRAGMENTS). - BENIGN COLONIC MUCOSA (MULTIPLE FRAGMENTS). - NO HIGH GRADE DYSPLASIA OR MALIGNANCY.      03/06/2016 PET scan    PET 03/06/2016 IMPRESSION: Negative PET scan for residual, recurrent or metastatic disease.       HISTORY OF PRESENTING ILLNESS:  Glenda Stevens 71 y.o. female is here because of recently diagnosed rectal squamous cell carcinoma.  She has been having diarrhea for the past 4 months, she has watery bowel movement 1-2 times daily, with some urgency, no blood, associated with abdominal cramps before the bowel movement. She denies any nausea or vomiting. She has good appetite, no weight loss. No fever or chills. She didn't seek medical attention until a month's ago she was in [place] by her primary care physician. Lab CBC and CMP was unremarkable, stool guaiac was positive. She was referred to gastroenterologist Dr. Ardis Hughs, and underwent her first colonoscopy on 08/01/2014. The colonoscopy showed a total of 14 polyps, 12 polyps were removed, and a distal rectal mass adjacent to the internal and no sphincter was found, which was suspicious for malignancy. Biopsy showed squamous cell carcinoma. Staging scan CT showed no evidence of adenopathy or distant metastasis. She was referred to our cancer center to discuss chemotherapy and radiation. She is going to see radiation oncologist Dr. Lisbeth Renshaw this afternoon.  CURRENT THERAPY: observation   INTERIM HISTORY Glenda Stevens returns for follow-up. She  has been seeing Dr. Marcello Moores with her last visit in the last 2 months. She feels good. She recently went to her PCP and noticed she was low in vitamin D. She has started taking vitamins and has been feeling much better. She still has diarrhea once daily sometimes resulting in accidents. She reports to having normal bowel movements about every 3 weeks  She denies any shortness of breath, nausea, or fatigue.She still walks her dog  daily.  MEDICAL HISTORY:  Past Medical History:  Diagnosis Date  . Allergy    Hay fever  . Anxiety   . Arthritis   . Colon cancer ([place]) 08/01/14   rectal squamous cell ca  . GERD (gastroesophageal reflux disease)   . History of chicken pox   . Hyperlipidemia   . Hypertension   . Phlebitis and thrombophlebitis   . PONV (postoperative nausea and vomiting)   . Renal artery stenosis ([facility])    left stent in 2003  . Renovascular hypertension   . S/P radiation therapy 09/05/14-10/19/14   anal ca,50.4Gy    SURGICAL HISTORY: Past Surgical History:  Procedure Laterality Date  . ABDOMINAL HYSTERECTOMY    . BREAST SURGERY     bilateral lumpectomy-benign  . CATARACT EXTRACTION Bilateral   . COLON RESECTION N/A 08/18/2014   Procedure: LAPAROSCOPIC ASSISTED  COLON POLYP RESECTION , DIAGNOSTIC LAPROSCOPY ;  Surgeon: Leighton Ruff, MD;  Location: [facility];  Service: General;  Laterality: N/A;  . NASAL SINUS SURGERY     patient denies having had nasal sinus surgery  . NM MYOCAR PERF WALL MOTION  09/2009   bruce myoview - normal pattern of perfusion, EF 83%, low risk scan  . RENAL ARTERY ANGIOPLASTY  07/02/2001   6x67mm Genesis on Aviator balloon stent overlapping a 6x4mm Genesist on Aviator balloon stent (Dr. Marella Chimes)  . RENAL ARTERY STENT    . RENAL DOPPLER  09/2012   left renal artery stent - 60-99% diameter reduction  . TRANSTHORACIC ECHOCARDIOGRAM  06/2012   EF 55-60%, mild MR    SOCIAL HISTORY: Social History   Social History  . Marital status: Divorced    Spouse name: N/A  . Number of children: 1  . Years of education: 4   Occupational History  . Education officer, museum    Social History Main Topics  . Smoking status: Current Some Day Smoker    Packs/day: 1.00    Years: 30.00    Types: Cigarettes  . Smokeless tobacco: Never Used  . Alcohol use 4.2 oz/week    7 Glasses of wine per week  . Drug use: No  . Sexual activity: Not Currently   Other Topics Concern  . Not on file    Social History Narrative   Divorced   1 son, local who lives with her   Retired from [employer] work   Hydrologist grad   Has poodle named "Murphy"    FAMILY HISTORY: Family History  Problem Relation Age of Onset  . Hypertension Mother   . Heart disease Mother   . Dementia Mother   . Hyperlipidemia Mother   . Heart disease Father   . Hyperlipidemia Father   . Cancer Father        lung cancer and prostate cancer   . Hypertension Father   . Breast cancer Cousin   . Cancer Cousin        4 paternal and 1 maternal cousins had breast cancer   . Healthy Brother   . Colon  cancer Neg Hx   . Esophageal cancer Neg Hx   . Stomach cancer Neg Hx   . Rectal cancer Neg Hx     ALLERGIES:  is allergic to crestor [rosuvastatin].  MEDICATIONS:  Current Outpatient Prescriptions  Medication Sig Dispense Refill  . ALPRAZolam (XANAX) 0.25 MG tablet Take 0.125 mg by mouth daily as needed for anxiety.    Marland Kitchen aspirin EC 81 MG tablet Take 81 mg by mouth daily.    Marland Kitchen atenolol (TENORMIN) 50 MG tablet TAKE 1 TABLET BY MOUTH  DAILY 90 tablet 2  . candesartan (ATACAND) 32 MG tablet TAKE 1 TABLET BY MOUTH  DAILY 90 tablet 2  . ketoconazole (NIZORAL) 2 % shampoo Apply 1 application topically 2 (two) times a week. 120 mL 1  . loperamide (IMODIUM) 2 MG capsule States taking 1/2 tab daily    . Multiple Vitamin (MULTIVITAMIN) tablet Take 1 tablet by mouth daily.    . Omega-3 Fatty Acids (FISH OIL PO) Take 1 tablet by mouth daily.    . ranitidine (ZANTAC) 150 MG tablet Take 150 mg by mouth daily as needed for heartburn.    . sertraline (ZOLOFT) 100 MG tablet Take 100 mg by mouth daily.     . Vitamin D, Ergocalciferol, (DRISDOL) 50000 units CAPS capsule Take 1 capsule (50,000 Units total) by mouth every 7 (seven) days. 16 capsule 0  . Probiotic Product (PROBIOTIC PO) Take 1 tablet by mouth daily.     Current Facility-Administered Medications  Medication Dose Route Frequency Provider Last Rate Last Dose  . 0.9 %   sodium chloride infusion  500 mL Intravenous Continuous Milus Banister, MD        REVIEW OF SYSTEMS:   Constitutional: Denies fevers, chills or abnormal night sweats Eyes: Denies blurriness of vision, double vision or watery eyes Ears, nose, mouth, throat, and face: Denies mucositis or sore throat Respiratory: Denies cough, dyspnea or wheezes Cardiovascular: Denies palpitation, chest discomfort or lower extremity swelling Gastrointestinal:  Denies nausea, heartburn. (+) Intermittent diarrhea Skin: Denies abnormal skin rashes Lymphatics: Denies new lymphadenopathy or easy bruising Neurological:Denies numbness, tingling or new weaknesses Behavioral/Psych: Mood is stable, no new changes  All other systems were reviewed with the patient and are negative.  PHYSICAL EXAMINATION:  ECOG PERFORMANCE STATUS: 0  Vitals:   09/17/16 1305  BP: (!) 186/61  Pulse: 71  Resp: 20  Temp: 98.6 F (37 C)   Filed Weights   09/17/16 1305  Weight: 138 lb 3.2 oz (62.7 kg)    GENERAL:alert, no distress and comfortable SKIN: skin color, texture, turgor are normal, no rashes or significant lesions EYES: normal, conjunctiva are pink and non-injected, sclera clear OROPHARYNX:no exudate, no erythema and lips, buccal mucosa, and tongue normal  NECK: supple, thyroid normal size, non-tender, without nodularity LYMPH:  no palpable lymphadenopathy in the cervical, axillary or inguinal area. LUNGS: clear to auscultation and percussion with normal breathing effort HEART: regular rate & rhythm and no murmurs and no lower extremity edema ABDOMEN:abdomen soft, non-tender and normal bowel sounds. Rectal exam was deferred today. PSYCH: alert & oriented x 3 with fluent speech NEURO: no focal motor/sensory deficits RECTAL: normal Rectal exam, no palpable mass, no blood on the glove.  LABORATORY DATA:  CBC Latest Ref Rng & Units 09/17/2016 05/17/2016 02/29/2016  WBC 3.9 - 10.3 10e3/uL 6.4 5.4 5.8  Hemoglobin 11.6 -  15.9 g/dL 13.5 15.7 14.7  Hematocrit 34.8 - 46.6 % 40.2 46.7(H) 43.1  Platelets 145 - 400 10e3/uL  173 195 178    CMP Latest Ref Rng & Units 09/17/2016 05/17/2016 02/29/2016  Glucose 70 - 140 mg/dl 102 96 72  BUN 7.0 - 26.0 mg/dL 6.7(L) 8 8.8  Creatinine 0.6 - 1.1 mg/dL 0.7 0.67 0.8  Sodium 136 - 145 mEq/L 138 140 137  Potassium 3.5 - 5.1 mEq/L 3.9 4.2 4.0  Chloride 96 - 106 mmol/L - 98 -  CO2 22 - 29 mEq/L 28 27 27   Calcium 8.4 - 10.4 mg/dL 9.6 9.3 9.4  Total Protein 6.4 - 8.3 g/dL 6.6 6.5 7.0  Total Bilirubin 0.20 - 1.20 mg/dL 0.46 0.3 0.41  Alkaline Phos 40 - 150 U/L 55 56 67  AST 5 - 34 U/L 23 19 21   ALT 0 - 55 U/L 18 13 15     PATHOLOGY REPORT: Diagnosis 08/01/2014  1. Colon, biopsy, ascending and cecum, polyp (7) - TUBULAR ADENOMAS (7). NO HIGH GRADE DYSPLASIA OR MALIGNANCY IDENTIFIED. 2. Colon, biopsy, hepatic flexure, polyp (3) - TUBULAR ADENOMAS (3). NO HIGH GRADE DYSPLASIA OR MALIGNANCY IDENTIFIED. 3. Colon, biopsy, transverse - TWO FRAGMENTS OF TUBULAR ADENOMA. NO HIGH GRADE DYSPLASIA OR MALIGNANCY IDENTIFIED. 4. Colon, biopsy, descending, sigmoid - TUBULAR ADENOMAS (3). NO HIGH GRADE DYSPLASIA OR MALIGNANCY IDENTIFIED. 5. Rectum, biopsy, distal mass - SQUAMOUS CELL CARCINOMA, BASALOID TYPE. - SEE MICROSCOPIC DESCRIPTION.  Colon, polyp(s), distal ascending 08/18/2014 - MULTIPLE FRAGMENTS OF TUBULAR ADENOMA. - NO HIGH GRADE DYSPLASIA OR MALIGNANCY.  Colonoscopy Pathology: Diagnosis 11/10/2015 Surgical [P], ascendingx3, transversex1, polyp (4) - TUBULAR ADENOMA (X3 FRAGMENTS). - BENIGN COLONIC MUCOSA (MULTIPLE FRAGMENTS). - NO HIGH GRADE DYSPLASIA OR MALIGNANCY.  RADIOGRAPHIC STUDIES:  PET 03/06/2016 IMPRESSION: Negative PET scan for residual, recurrent or metastatic disease.  PET 02/23/2015 IMPRESSION: 1. Interval response to therapy (SUV 4.49. Previously 7.9.). 2. Decrease in FDG uptake associated with anal tumor. 3. No new or progressive disease identified  within the chest, abdomen or pelvis.  PROCEDURES Colonoscopy 11/10/2015 - Three 4 to 5 mm polyps in the ascending colon, removed with a hot snare. Resected and retrieved. - One 4 mm polyp in the transverse colon, removed with a cold snare. Resected and retrieved. - The examination was otherwise normal on direct and retroflexion views.   COLONOSCOPY 08/18/2014 ENDOSCOPIC IMPRESSION: In the distal ascending colon, near the hepatic flexure the two sessile polyps were again located. One was 1cm, the other about 1.7cm along a fold. Neither now appeared cancerous. Previous tatoo at the site was somewhat visible. With external pressure and manipulation of the colon by Dr. Marcello Moores using laparscopic technique I was able to resect the polyps. I used saline pressure injector to lift the polyps and then piecemeal snare/cautery technique. I felt there was a small amount of remaining polyp tissue at the site and I used snare tip cautery to cauterize it. Three other small (2-55mm) clearly adenomatous polyps were removed from the right colon (not retrieved). The examination was otherwise normal RECOMMENDATIONS: Await final pathology, likely you will need repeat colonoscopy in 1 year.  Colonoscopy 08/01/2014 They were adjacent to eachother. They were located on both sides of a fold, sessile, slightly firm. The largest of the two (68mm) was biopsied and then the site was labled with SPOT via submucosal injection. These biopsies were in jar #3. The other 12 polyp were all sessile, located in cecum, ascending, hepatic flexure, descending and sigmoid. Two were removed with snare cautery (ascending and sigmoid), and the rest removed with cold snare. Sent in three different jars (#1 ascending, cecum), (#2hepatic  flexure), (#4 descending, sigmoid). There was also an ulcerated, malignant appearing distal rectal mass that was adjacent to the internal anal sphincter. This was non-cirumferential, 3-5cm across, friable, biopsied  extensively (#5). There were 8-10 diminutive polyps spread throughout the colon that I did not remove (2-77mm each). Retroflexed views revealed no abnormalities. The time to cecum = 9min Withdrawal time = 29min The scope was withdrawn and the procedure completed. COMPLICATIONS: There were no immediate complications. ENDOSCOPIC IMPRESSION: Multiple sites of significant neoplasia, see above  ASSESSMENT & PLAN: 71 y.o. Caucasian female with past medical history of hypertension, anxiety and depression, mild arthritis, who presents with watery diarrhea for 4 months.  1. Rectal squamous cell carcinoma, [id number], stage I  -I previously reviewed her colonoscopy findings, the rectal mass biopsy results with patient and her son in great details. -We previously reviewed the natural history of rectal cancer, and staging workup. CT scans reviewed no local adenopathy or distant metastasis. she early stage rectal cancer. -She completed her concurrent chemoradiation, with 2 cycles of full doses of FU and mitomycin. The goal of treatment was curative -I previously reviewed her restaging PET scan from 02/2015, which showed interval response and decreased FDG uptake in the and no mass. No other evidence of recurrence. We previously discussed that the residual hypermetabolic activity is possible related to her radiation, less likely residual tumor. -Colonoscopy on 11/10/15, revealed three 4-5 mm polyps in the ascending colon and these were removed. Pathology revealed no high grade dysplasia or malignancy. -I reviewed her restaging PET scan from 03/06/16, and this showed no residual, recurrent, or metastatic disease. -She is currently doing well, lab results reviewed with her, normal CBC and CMP, no evidence of recurrence. Rectal exam unremarkable during this encounter. --We will continue cancer surveillance. Will repeat scan at end of this year. I requested today - I will see her back early next year, she will see Dr.Thomas  in 3 months   2. Smoking cessation -I strongly encouraged her to stop smoking, she is willing to cut back.  3. Hypertension, anxiety -She'll continue follow-up with her primary care physician  4. Loose stools -Managed with imodium -Advised the patient that too much imodium could cause constipation. The patient is aware of this.  PLAN -Repeat his surveillance PET scan at the end of December 2018 - lab and f/u in early Jan 2019 -She will see Dr. Marcello Moores, her surgeon, in September and every 6 months.  All questions were answered. The patient knows to call the clinic with any problems, questions or concerns.  I spent 20 minutes counseling the patient face to face. The total time spent in the appointment was 25 minutes and more than 50% was on counseling.     Truitt Merle, MD 09/17/2016   This document serves as a record of services personally performed by Truitt Merle, MD. It was created on her behalf by Brandt Loosen, a trained medical scribe. The creation of this record is based on the scribe's personal observations and the provider's statements to them. This document has been checked and approved by the attending provider.

## 2016-09-17 ENCOUNTER — Encounter: Payer: Self-pay | Admitting: Hematology

## 2016-09-17 ENCOUNTER — Ambulatory Visit (HOSPITAL_BASED_OUTPATIENT_CLINIC_OR_DEPARTMENT_OTHER): Payer: Medicare Other | Admitting: Hematology

## 2016-09-17 ENCOUNTER — Other Ambulatory Visit (HOSPITAL_BASED_OUTPATIENT_CLINIC_OR_DEPARTMENT_OTHER): Payer: Medicare Other

## 2016-09-17 VITALS — BP 186/61 | HR 71 | Temp 98.6°F | Resp 20 | Ht 63.0 in | Wt 138.2 lb

## 2016-09-17 DIAGNOSIS — R197 Diarrhea, unspecified: Secondary | ICD-10-CM | POA: Diagnosis not present

## 2016-09-17 DIAGNOSIS — C20 Malignant neoplasm of rectum: Secondary | ICD-10-CM

## 2016-09-17 DIAGNOSIS — Z720 Tobacco use: Secondary | ICD-10-CM | POA: Diagnosis not present

## 2016-09-17 LAB — CBC WITH DIFFERENTIAL/PLATELET
BASO%: 0.3 % (ref 0.0–2.0)
Basophils Absolute: 0 10*3/uL (ref 0.0–0.1)
EOS%: 2.4 % (ref 0.0–7.0)
Eosinophils Absolute: 0.2 10*3/uL (ref 0.0–0.5)
HCT: 40.2 % (ref 34.8–46.6)
HEMOGLOBIN: 13.5 g/dL (ref 11.6–15.9)
LYMPH%: 11.8 % — ABNORMAL LOW (ref 14.0–49.7)
MCH: 32.2 pg (ref 25.1–34.0)
MCHC: 33.6 g/dL (ref 31.5–36.0)
MCV: 95.9 fL (ref 79.5–101.0)
MONO#: 0.4 10*3/uL (ref 0.1–0.9)
MONO%: 6.7 % (ref 0.0–14.0)
NEUT#: 5 10*3/uL (ref 1.5–6.5)
NEUT%: 78.8 % — ABNORMAL HIGH (ref 38.4–76.8)
Platelets: 173 10*3/uL (ref 145–400)
RBC: 4.19 10*6/uL (ref 3.70–5.45)
RDW: 13.6 % (ref 11.2–14.5)
WBC: 6.4 10*3/uL (ref 3.9–10.3)
lymph#: 0.8 10*3/uL — ABNORMAL LOW (ref 0.9–3.3)

## 2016-09-17 LAB — COMPREHENSIVE METABOLIC PANEL
ALBUMIN: 3.7 g/dL (ref 3.5–5.0)
ALK PHOS: 55 U/L (ref 40–150)
ALT: 18 U/L (ref 0–55)
AST: 23 U/L (ref 5–34)
Anion Gap: 7 mEq/L (ref 3–11)
BUN: 6.7 mg/dL — AB (ref 7.0–26.0)
CO2: 28 meq/L (ref 22–29)
Calcium: 9.6 mg/dL (ref 8.4–10.4)
Chloride: 103 mEq/L (ref 98–109)
Creatinine: 0.7 mg/dL (ref 0.6–1.1)
EGFR: 80 mL/min/{1.73_m2} — ABNORMAL LOW (ref >90)
GLUCOSE: 102 mg/dL (ref 70–140)
POTASSIUM: 3.9 meq/L (ref 3.5–5.1)
SODIUM: 138 meq/L (ref 136–145)
Total Bilirubin: 0.46 mg/dL (ref 0.20–1.20)
Total Protein: 6.6 g/dL (ref 6.4–8.3)

## 2016-09-22 ENCOUNTER — Encounter: Payer: Self-pay | Admitting: Hematology

## 2016-10-10 LAB — HM MAMMOGRAPHY

## 2016-10-16 ENCOUNTER — Telehealth: Payer: Self-pay | Admitting: Adult Health

## 2016-10-16 NOTE — Telephone Encounter (Signed)
Pt called states she has requested a refill on the Rx shampoo/ Ketoconazole 2%  for fungus [place] prescribed but pharmacy hasn't sent [place] a request-- Pls call into [facility] on [address] [place]

## 2016-10-17 ENCOUNTER — Ambulatory Visit (INDEPENDENT_AMBULATORY_CARE_PROVIDER_SITE_OTHER): Payer: Medicare Other | Admitting: Adult Health

## 2016-10-17 ENCOUNTER — Encounter: Payer: Self-pay | Admitting: Adult Health

## 2016-10-17 VITALS — BP 161/80 | HR 63 | Ht 63.0 in | Wt 138.0 lb

## 2016-10-17 DIAGNOSIS — S0501XA Injury of conjunctiva and corneal abrasion without foreign body, right eye, initial encounter: Secondary | ICD-10-CM | POA: Diagnosis not present

## 2016-10-17 DIAGNOSIS — B354 Tinea corporis: Secondary | ICD-10-CM | POA: Diagnosis not present

## 2016-10-17 DIAGNOSIS — L821 Other seborrheic keratosis: Secondary | ICD-10-CM

## 2016-10-17 MED ORDER — KETOCONAZOLE 2 % EX SHAM: 1.0000 "application " | MEDICATED_SHAMPOO | CUTANEOUS | 1 refills | Status: DC

## 2016-10-17 MED ORDER — OFLOXACIN 0.3 % OP SOLN: 1.0000 [drp] | Freq: Four times a day (QID) | OPHTHALMIC | 0 refills | Status: DC

## 2016-10-17 NOTE — Assessment & Plan Note (Signed)
Derm referral placed

## 2016-10-17 NOTE — Progress Notes (Signed)
Subjective:    Patient ID: Glenda Stevens, female    DOB: Oct 09, 1945, 71 y.o.   MRN: [id number]   HPI:  Glenda Stevens is here for OD irritation, redness that developed a few days ago.  She feels that "something got into my eye and I have been rubbing at it".  She denies change in vision/HA/dizziness.  She denies drainage/pain. She does not wear contacts or rx lenses.  Patient Care Team    Relationship Specialty Notifications Start End  [name] D, NP PCP - General Family Medicine  7/0/62   Leighton Ruff, MD Consulting Physician General Surgery  10/24/14   Kyung Rudd, MD Consulting Physician Radiation Oncology  10/24/14     Patient Active Problem List   Diagnosis Date Noted  . Corneal abrasion, right 10/17/2016  . Vitamin D deficiency 07/18/2016  . Other fatigue 05/09/2016  . Bilateral hip pain 05/09/2016  . Tinea corporis 05/09/2016  . Foot pain, bilateral 05/09/2016  . Medicare annual wellness visit, initial 07/06/2015  . Anxiety 07/06/2015  . Rectal cancer ([place]) 08/01/2014  . Diarrhea 07/08/2014  . Seborrheic keratoses 03/18/2014  . Nasal congestion 09/14/2013  . Smoker 09/14/2013  . Renal artery stenosis ([place]) 08/06/2013  . Hyperlipidemia 08/06/2013  . Essential hypertension 08/06/2013     Past Medical History:  Diagnosis Date  . Allergy    Hay fever  . Anxiety   . Arthritis   . Colon cancer ([place]) 08/01/14   rectal squamous cell ca  . GERD (gastroesophageal reflux disease)   . History of chicken pox   . Hyperlipidemia   . Hypertension   . Phlebitis and thrombophlebitis   . PONV (postoperative nausea and vomiting)   . Renal artery stenosis ([facility])    left stent in 2003  . Renovascular hypertension   . 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 ABDOMINAL HYSTERECTOMY    . BREAST SURGERY     bilateral lumpectomy-benign  . CATARACT EXTRACTION Bilateral   . COLON RESECTION N/A 08/18/2014   Procedure: LAPAROSCOPIC ASSISTED  COLON POLYP RESECTION , DIAGNOSTIC LAPROSCOPY ;  Surgeon: Leighton Ruff, MD;  Location: [facility];  Service: General;  Laterality: N/A;  . NASAL SINUS SURGERY     patient denies having had nasal sinus surgery  . NM MYOCAR PERF WALL MOTION  09/2009   bruce myoview - normal pattern of perfusion, EF 83%, low risk scan  . RENAL ARTERY ANGIOPLASTY  07/02/2001   6x34mm Genesis on Aviator balloon stent overlapping a 6x49mm Genesist on Aviator balloon stent (Dr. Marella Chimes)  . RENAL ARTERY STENT    . RENAL DOPPLER  09/2012   left renal artery stent - 60-99% diameter reduction  . TRANSTHORACIC ECHOCARDIOGRAM  06/2012   EF 55-60%, mild MR     Family History  Problem Relation Age of Onset  . Hypertension Mother   . Heart disease Mother   . Dementia Mother   . Hyperlipidemia Mother   . Heart disease Father   . Hyperlipidemia Father   . Cancer Father        lung cancer and prostate cancer   . Hypertension Father   . Breast cancer Cousin   . Cancer Cousin        4 paternal and 1 maternal cousins had breast cancer   . Healthy Brother   . Colon cancer Neg Hx   . Esophageal cancer Neg Hx   .  Stomach cancer Neg Hx   . Rectal cancer Neg Hx      History  Drug Use No     History  Alcohol Use  . 4.2 oz/week  . 7 Glasses of wine per week     History  Smoking Status  . Current Some Day Smoker  . Packs/day: 1.00  . Years: 30.00  . Types: Cigarettes  Smokeless Tobacco  . Never Used     Outpatient Encounter Prescriptions as of 10/17/2016  Medication Sig  . ALPRAZolam (XANAX) 0.25 MG tablet Take 0.125 mg by mouth daily as needed for anxiety.  Marland Kitchen aspirin EC 81 MG tablet Take 81 mg by mouth daily.  Marland Kitchen atenolol (TENORMIN) 50 MG tablet TAKE 1 TABLET BY MOUTH  DAILY  . candesartan (ATACAND) 32 MG tablet TAKE 1 TABLET BY MOUTH  DAILY  . ketoconazole (NIZORAL) 2 % shampoo Apply 1 application topically 2 (two) times a week.  . loperamide (IMODIUM) 2 MG capsule  States taking 1/2 tab daily  . Multiple Vitamin (MULTIVITAMIN) tablet Take 1 tablet by mouth daily.  . Omega-3 Fatty Acids (FISH OIL PO) Take 1 tablet by mouth daily.  . ranitidine (ZANTAC) 150 MG tablet Take 150 mg by mouth daily as needed for heartburn.  . sertraline (ZOLOFT) 100 MG tablet Take 100 mg by mouth daily.   . Vitamin D, Ergocalciferol, (DRISDOL) 50000 units CAPS capsule Take 1 capsule (50,000 Units total) by mouth every 7 (seven) days.  Marland Kitchen ofloxacin (OCUFLOX) 0.3 % ophthalmic solution Place 1 drop into the right eye 4 (four) times daily.  . [DISCONTINUED] Probiotic Product (PROBIOTIC PO) Take 1 tablet by mouth daily.   Facility-Administered Encounter Medications as of 10/17/2016  Medication  . 0.9 %  sodium chloride infusion    Allergies: Crestor [rosuvastatin]  Body mass index is 24.45 kg/m.  Blood pressure (!) 161/80, pulse 63, height 5\' 3"  (1.6 m), weight 138 lb (62.6 kg).    Review of Systems  Eyes: Positive for redness. Negative for photophobia, pain, discharge, itching and visual disturbance.  Musculoskeletal: Negative for neck pain and neck stiffness.  Skin: Negative for color change, pallor, rash and wound.  Allergic/Immunologic: Negative for immunocompromised state.  Hematological: Does not bruise/bleed easily.       Objective:   Physical Exam  Constitutional: She appears well-developed and well-nourished. No distress.  HENT:  Head: Normocephalic and atraumatic.  Right Ear: External ear normal.  Left Ear: External ear normal.  Eyes: Pupils are equal, round, and reactive to light. EOM and lids are normal. Right eye exhibits no discharge, no exudate and no hordeolum. No foreign body present in the right eye. Left eye exhibits no discharge. Right conjunctiva has a hemorrhage.    Conjunctival hemorrhage with corneal abrasion lateral R eye. No drainage/discharge noted.    Skin: Skin is warm and dry. No rash noted. She is not diaphoretic. No erythema. No  pallor.  Psychiatric: She has a normal mood and affect. Her behavior is normal. Judgment and thought content normal.  Nursing note and vitals reviewed.         Assessment & Plan:   1. Seborrheic keratoses   2. Tinea corporis   3. Abrasion of right cornea, initial encounter     Corneal abrasion, right Ofloxacin 0.3% Opthalmic solution, advised to make appt with her  Ophthalmologis. Warm compress to eye as needed. STOP touching OD  Seborrheic keratoses Derm referral placed.  Tinea corporis Derm referral placed.    FOLLOW-UP:  Return if symptoms worsen or fail to improve. 

## 2016-10-17 NOTE — Patient Instructions (Signed)
Corneal Abrasion A corneal abrasion is a scratch or injury to the clear covering over the front of your eye (cornea). Your cornea forms a clear dome that protects your eye and helps to focus your vision. Your cornea is made up of many layers. The surface layer is a single layer of cells (corneal epithelium). It is one of the most sensitive tissues in your body. A corneal abrasion can be very painful. If a corneal abrasion is not treated, it can become infected and cause an ulcer. This can lead to scarring. A scarred cornea can affect your vision. Sometimes abrasions come back in the same area, even after the original injury has healed (recurrent erosion syndrome). What are the causes? This condition may be caused by:  A poke in the eye.  A gritty or irritating substance (foreign body) in the eye.  Excessive eye rubbing.  Very dry eyes.  Certain eye infections.  Contact lenses that fit poorly or are worn for a long period of time. You can also injure your cornea when putting contacts lenses in your eye or taking them out.  Eye surgery.  Sometimes, the cause is unknown. What are the signs or symptoms? Symptoms of this condition include:  Eye pain. The pain may get worse when your eye is open or when you move your eye.  A feeling of something stuck in your eye.  Having trouble keeping your eye open, or not being able to keep it open.  Tearing and redness.  Sensitivity to light.  Blurred vision.  Headache.  How is this diagnosed? This condition may be diagnosed based on:  Your medical history.  Your symptoms.  An eye exam. You may work with a health care provider who specializes in diseases and conditions of the eye (ophthalmologist). Before the eye exam, numbing drops may be put into your eye. You may also have dye put in your eye with a dropper or a small paper strip. The dye makes the abrasion easy to see when your ophthalmologist examines your eye with a light. Your  ophthalmologist may look at your eye through an eye scope (slit lamp).  How is this treated? Treatment may vary depending on the cause of your condition, and it may include:  Washing out your eye.  Removing any foreign body.  Antibiotic drops or ointment to treat an infection.  Steroid drops or ointment to treat redness, irritation, or inflammation.  Pain medicine.  An eye patch to keep your eye closed.  Follow these instructions at home: Medicines  Use eye drops or ointments as told by your eye care provider.  If you were prescribed antibiotic drops or ointment, use them as told by your eye care provider. Do not stop using the antibiotic even if you start to feel better.  Take over-the-counter and prescription medicines only as told by your eye care provider.  Do not drive or use heavy machinery while taking prescription pain medicine. General instructions  If you have an eye patch, wear it as told by your eye care provider. ? Do not drive or use machinery while wearing an eye patch. Your ability to judge distances will be impaired. ? Follow instructions from your eye care provider about when to remove the patch.  Ask your eye care provider whether you can use a cold, wet cloth (compress) on your eye to relieve pain.  Do not rub or touch your eye. Do not wash out your eye.  Do not wear contact lenses until  your eye care provider says that this is okay.  Avoid bright light and eye strain.  Keep all follow-up visits as told by your eye care provider. This is important for preventing infection and vision loss. Contact a health care provider if:  You continue to have eye pain and other symptoms for more than 2 days.  You develop new symptoms, such as redness, tearing, or discharge.  You have discharge that makes your eyelids stick together in the morning.  Your eye patch becomes so loose that you can blink your eye.  Symptoms return after the original abrasion has  healed. Get help right away if:  You have severe eye pain that does not get better with medicine.  You have vision loss. Summary  A corneal abrasion is a scratch on the outer layer of the clear covering over the front of your eye (cornea).  Corneal abrasion can cause eye pain, redness, tearing, and blurred vision.  This condition is usually treated with medicine to prevent infection and scarring. You also may have to wear an eye patch to cover your eye.  Let your eye care provider know if your symptoms continue for more than 2 days. This information is not intended to replace advice given to you by your health care provider. Make sure you discuss any questions you have with your health care provider. Document Released: 02/23/2000 Document Revised: 02/06/2016 Document Reviewed: 02/06/2016 Elsevier Interactive Patient Education  2017 [employer].  Please use eye drops as directed. Please call your Ophthalmologist to have your right eye examined. Dermatology referral placed.

## 2016-10-17 NOTE — Assessment & Plan Note (Signed)
Ofloxacin 0.3% Opthalmic solution, advised to make appt with her  Ophthalmologis. Warm compress to eye as needed. STOP touching OD

## 2016-12-13 ENCOUNTER — Encounter: Payer: Self-pay | Admitting: Family Medicine

## 2016-12-13 ENCOUNTER — Ambulatory Visit (INDEPENDENT_AMBULATORY_CARE_PROVIDER_SITE_OTHER): Payer: Medicare Other | Admitting: Family Medicine

## 2016-12-13 VITALS — BP 160/81 | HR 73 | Ht 63.0 in | Wt 139.4 lb

## 2016-12-13 DIAGNOSIS — H1030 Unspecified acute conjunctivitis, unspecified eye: Secondary | ICD-10-CM | POA: Diagnosis not present

## 2016-12-13 MED ORDER — POLYMYXIN B-TRIMETHOPRIM 10000-0.1 UNIT/ML-% OP SOLN: 2.0000 [drp] | OPHTHALMIC | 0 refills | Status: DC

## 2016-12-13 NOTE — Progress Notes (Signed)
Pt here for an acute care OV today   Impression and Recommendations:    1. Acute conjunctivitis, unspecified acute conjunctivitis type, unspecified laterality    We will treat for bacterial conjunctivitis-prescription for Polytrim given to patient.  Please use the medicine we called in to your pharmacy.  After your still having some itchy or watery eyes you can use over-the-counter Naphcon A as needed.  - Please use warm compresses to your eyes as much as possible.  This will help increase blood flow and move the infection along.  - Seasonal allergy medicine such as Xyzal, Zyrtec, Allegra or Claritin can also be taken daily throughout the allergy season.  The patient was counseled, risk factors were discussed, anticipatory guidance given.  Gross side effects, risk and benefits, and alternatives of medications and treatment plan in general discussed with patient.  Patient is aware that all medications have potential side effects and we are unable to predict every side effect or drug-drug interaction that may occur.   Patient will call with any questions prior to using medication if they have concerns.  Expresses verbal understanding and consents to current therapy and treatment regimen.  No barriers to understanding were identified.  Red flag symptoms and signs discussed in detail.  Patient expressed understanding regarding what to do in case of emergency\urgent symptoms  Please see AVS handed out to patient at the end of our visit for further patient instructions/ counseling done pertaining to today's office visit.   Return if symptoms worsen or fail to improve.     Note: This document was prepared using Dragon voice recognition software and may include unintentional dictation errors.  Mellody Dance 12:41  PM --------------------------------------------------------------------------------------------------------------------------------------------------------------------------------------------------------------------------------------------    Subjective:    CC:  Chief Complaint  Patient presents with  . Eye Drainage    bilat eye drainage, bruning, and redness started 6 days ago     HPI: Glenda Stevens is a 71 y.o. female who presents to [place] [facility] today for issues as discussed below.  Patient presents with concerns for redness, watery eyes-which she suffers from during the allergy season worse in the fall.  They do itch as well..  The last couple of days however she has had some morning "eye snots" and is now worried about infection. -No other associated symptoms.  Wt Readings from Last 3 Encounters:  12/13/16 139 lb 6.4 oz (63.2 kg)  10/17/16 138 lb (62.6 kg)  09/17/16 138 lb 3.2 oz (62.7 kg)   BP Readings from Last 3 Encounters:  01/13/17 140/81  12/13/16 (!) 160/81  10/17/16 (!) 161/80   BMI Readings from Last 3 Encounters:  12/13/16 24.69 kg/m  10/17/16 24.45 kg/m  09/17/16 24.48 kg/m     Patient Care Team    Relationship Specialty Notifications Start End  [name] D, NP PCP - General Family Medicine  08/12/99   Leighton Ruff, MD Consulting Physician General Surgery  10/24/14   Kyung Rudd, MD Consulting Physician Radiation Oncology  10/24/14      Patient Active Problem List   Diagnosis Date Noted  . Corneal abrasion, right 10/17/2016  . Vitamin D deficiency 07/18/2016  . Other fatigue 05/09/2016  . Bilateral hip pain 05/09/2016  . Tinea corporis 05/09/2016  . Foot pain, bilateral 05/09/2016  . Medicare annual wellness visit, initial 07/06/2015  . Anxiety 07/06/2015  . Rectal cancer ([place]) 08/01/2014  . Diarrhea 07/08/2014  . Seborrheic keratoses 03/18/2014  . Nasal congestion 09/14/2013  .  Smoker 09/14/2013  . Renal artery  stenosis ([place]) 08/06/2013  . Hyperlipidemia 08/06/2013  . Essential hypertension 08/06/2013    Past Medical history, Surgical history, Family history, Social history, Allergies and Medications have been entered into the medical record, reviewed and changed as needed.    Current Meds  Medication Sig  . ALPRAZolam (XANAX) 0.25 MG tablet Take 0.125 mg by mouth daily as needed for anxiety.  Marland Kitchen aspirin EC 81 MG tablet Take 81 mg by mouth daily.  Marland Kitchen atenolol (TENORMIN) 50 MG tablet TAKE 1 TABLET BY MOUTH  DAILY  . Cholecalciferol (VITAMIN D PO) Take by mouth daily.  Marland Kitchen ketoconazole (NIZORAL) 2 % shampoo Apply 1 application topically 2 (two) times a week.  . loperamide (IMODIUM) 2 MG capsule States taking 1/2 tab daily  . Multiple Vitamin (MULTIVITAMIN) tablet Take 1 tablet by mouth daily.  Marland Kitchen ofloxacin (OCUFLOX) 0.3 % ophthalmic solution Place 1 drop into the right eye 4 (four) times daily.  . Omega-3 Fatty Acids (FISH OIL PO) Take 1 tablet by mouth daily.  . ranitidine (ZANTAC) 150 MG tablet Take 150 mg by mouth daily as needed for heartburn.  . sertraline (ZOLOFT) 100 MG tablet Take 100 mg by mouth daily.   . Vitamin D, Ergocalciferol, (DRISDOL) 50000 units CAPS capsule Take 1 capsule (50,000 Units total) by mouth every 7 (seven) days.  . [DISCONTINUED] candesartan (ATACAND) 32 MG tablet TAKE 1 TABLET BY MOUTH  DAILY   Current Facility-Administered Medications for the 12/13/16 encounter (Office Visit) with Mellody Dance, DO  Medication  . 0.9 %  sodium chloride infusion    Allergies:  Allergies  Allergen Reactions  . Crestor [Rosuvastatin]     Tolerated lipitor prev. Intolerance     Review of Systems: General:   Denies fever, chills, unexplained weight loss.  Optho/Auditory:   Denies visual changes, blurred vision/LOV Respiratory:   Denies wheeze, DOE more than baseline levels.  Cardiovascular:   Denies chest pain, palpitations, new onset peripheral edema  Gastrointestinal:    Denies nausea, vomiting, diarrhea, abd pain.  Genitourinary: Denies dysuria, freq/ urgency, flank pain or discharge from genitals.  Endocrine:     Denies hot or cold intolerance, polyuria, polydipsia. Musculoskeletal:   Denies unexplained myalgias, joint swelling, unexplained arthralgias, gait problems.  Skin:  Denies new onset rash, suspicious lesions Neurological:     Denies dizziness, unexplained weakness, numbness  Psychiatric/Behavioral:   Denies mood changes, suicidal or homicidal ideations, hallucinations    Objective:   Blood pressure (!) 160/81, pulse 73, height 5\' 3"  (1.6 m), weight 139 lb 6.4 oz (63.2 kg). Body mass index is 24.69 kg/m. General:  Well Developed, well nourished, appropriate for stated age.  Neuro:  Alert and oriented,  extra-ocular muscles intact, no conjunctivitis essentially within normal limits.  No exudate or discharge from the eyes.  Papilla appears patent bilaterally HEENT:  Normocephalic, atraumatic, neck supple Skin:  no gross rash, warm, pink. Cardiac:  RRR, S1 S2 Respiratory:  ECTA B/L and A/P, Not using accessory muscles, speaking in full sentences- unlabored. Vascular:  Ext warm, no cyanosis apprec.; cap RF less 2 sec. Psych:  No HI/SI, judgement and insight good, Euthymic mood. Full Affect.

## 2016-12-13 NOTE — Patient Instructions (Signed)
We will treat you for bacterial conjunctivitis.  Please use the medicine we called in to your pharmacy.  After your still having some itchy or watery eyes you can use over-the-counter Naphcon a as needed.  - Please use warm compresses to your eyes as much as possible.  This will help increase blood flow and move the infection along.  - Seasonal allergy medicine such as Xyzal, Zyrtec, Allegra or Claritin can also be taken daily throughout the allergy season.

## 2016-12-18 ENCOUNTER — Other Ambulatory Visit: Payer: Self-pay | Admitting: Family Medicine

## 2016-12-18 NOTE — Telephone Encounter (Signed)
Received refill request from pharmacy for Varenicline. Chart shows PCP as [name]. NP. Medication is not on list.

## 2016-12-19 NOTE — Telephone Encounter (Signed)
Deny this rx. I routed to PCP as FYI.  I'll defer.  Thanks.

## 2016-12-19 NOTE — Telephone Encounter (Signed)
LVM for pt to call to discuss.  T. Nelson, CMA  

## 2016-12-19 NOTE — Telephone Encounter (Signed)
Morning Glenda Stevens, I see in notes that she has extensive hx of tobacco abuse, but I do not see any discussion with her in regards to using Chantix. I declined this b/c it is not listed on her med and and the above. She was seen once in March to establish and august/oct for eye issues, but never seen for regular f/u. She has hx of depression/anxiety and before starting Chantix she will need an OV to discuss. GREAT THAT SHE WANTS TO QUIT. Can you please call [name] and share the above. Thanks! [name]

## 2016-12-23 NOTE — Telephone Encounter (Signed)
LVM for pt to call to discuss.  T. Nelson, CMA  

## 2016-12-24 ENCOUNTER — Telehealth: Payer: Self-pay | Admitting: Adult Health

## 2016-12-24 NOTE — Telephone Encounter (Signed)
Noted.  T. Nelson, CMA 

## 2016-12-24 NOTE — Telephone Encounter (Signed)
Pt called states she spoke to MA/ Kenney Houseman earlier today about an Rx she requested in error-- Patient says she no longer wishes Rx and for MA to disregard request. ---glh

## 2016-12-24 NOTE — Telephone Encounter (Signed)
Since patient has not returned phone calls, MyChart message sent to patient.  Charyl Bigger, CMA

## 2017-01-13 ENCOUNTER — Ambulatory Visit (INDEPENDENT_AMBULATORY_CARE_PROVIDER_SITE_OTHER): Payer: Medicare Other

## 2017-01-13 VITALS — BP 140/81 | HR 76 | Temp 98.3°F

## 2017-01-13 DIAGNOSIS — Z23 Encounter for immunization: Secondary | ICD-10-CM | POA: Diagnosis not present

## 2017-01-13 NOTE — Progress Notes (Signed)
Flu Shot

## 2017-01-20 ENCOUNTER — Encounter: Payer: Self-pay | Admitting: Hematology

## 2017-02-15 ENCOUNTER — Other Ambulatory Visit: Payer: Self-pay | Admitting: Adult Health

## 2017-03-05 ENCOUNTER — Ambulatory Visit (HOSPITAL_COMMUNITY): Payer: Medicare Other

## 2017-03-06 ENCOUNTER — Telehealth: Payer: Self-pay | Admitting: Hematology

## 2017-03-06 NOTE — Telephone Encounter (Signed)
Call day - moved 1/4 appointments to earlier time. Not able to reach patient or leave message. Schedule mailed.

## 2017-03-07 ENCOUNTER — Ambulatory Visit (HOSPITAL_COMMUNITY)
Admission: RE | Admit: 2017-03-07 | Discharge: 2017-03-07 | Disposition: A | Discharge status: Home / Self Care | Payer: Medicare Other | Source: Ambulatory Visit | Attending: Hematology | Admitting: Hematology

## 2017-03-07 DIAGNOSIS — Z08 Encounter for follow-up examination after completed treatment for malignant neoplasm: Secondary | ICD-10-CM | POA: Insufficient documentation

## 2017-03-07 DIAGNOSIS — C20 Malignant neoplasm of rectum: Secondary | ICD-10-CM

## 2017-03-07 DIAGNOSIS — Z85038 Personal history of other malignant neoplasm of large intestine: Secondary | ICD-10-CM | POA: Diagnosis not present

## 2017-03-07 LAB — GLUCOSE, CAPILLARY: Glucose-Capillary: 89 mg/dL (ref 65–99)

## 2017-03-07 MED ORDER — FLUDEOXYGLUCOSE F - 18 (FDG) INJECTION
6.9000 | Freq: Once | INTRAVENOUS | Status: AC | PRN
  Administered 2017-03-07: 6.9 via INTRAVENOUS

## 2017-03-13 ENCOUNTER — Other Ambulatory Visit: Payer: Self-pay | Admitting: Hematology

## 2017-03-13 DIAGNOSIS — C20 Malignant neoplasm of rectum: Secondary | ICD-10-CM

## 2017-03-13 NOTE — Progress Notes (Signed)
[facility]  [phone] [phone]  Clinic follow up Note   Patient Care Team: Esaw Grandchild, NP as PCP - General (Family Medicine) Leighton Ruff, MD as Consulting Physician (General Surgery) Kyung Rudd, MD as Consulting Physician (Radiation Oncology)   Date of Service:  03/14/2017  CHIEF COMPLAINTS:  Follow up rectal squamous cell carcinoma  Oncology History   Presented with heme + stool, loose stools for 2 months     Rectal cancer ([place])   08/01/2014 Pathology Results    Rectum, biopsy, distal mass - SQUAMOUS CELL CARCINOMA, BASALOID TYPE      08/01/2014 Procedure    Colonoscopy-ulcerated, malignant appearing distal rectal mass adjacent to the internal anal sphincter. #14 polyps spread throughout the colon (#12 removed)      08/01/2014 Tumor Marker    CEA=5.3      08/01/2014 Initial Diagnosis    Rectal cancer      08/04/2014 Imaging    CT C/A/P-No evidence of metastatic disease in chest, abdomen, or pelvis      08/18/2014 Procedure    Colonoscopy and parotidectomy. Path reviewed tubular adenoma.      08/25/2014 Imaging    PET IMPRESSION: 1. Hypermetabolic activity at the anus corresponding with known squamous cell carcinoma. No evidence of metastatic disease. 2. No other suspicious metabolic activity. 3. Diffuse atherosclerosis.      09/05/2014 - 10/19/2014 Radiation Therapy    definitive XRT to 50.4 Gy with concurrent chemotherapy      09/05/2015 - 10/07/2015 Chemotherapy    Mitomycin 10mg /m2 on Day 1 and 28, 5-fu 1000mg /m2 on day 1-5  and day 28-32 (96 hours)      11/10/2015 Procedure    Colonoscopy 11/10/2015 - Three 4 to 5 mm polyps in the ascending colon, removed with a hot snare. Resected and retrieved. - One 4 mm polyp in the transverse colon, removed with a cold snare. Resected and retrieved. - The examination was otherwise normal on direct and retroflexion views.       11/10/2015 Pathology Results    Diagnosis  11/10/2015 Surgical [P], ascendingx3, transversex1, polyp (4) - TUBULAR ADENOMA (X3 FRAGMENTS). - BENIGN COLONIC MUCOSA (MULTIPLE FRAGMENTS). - NO HIGH GRADE DYSPLASIA OR MALIGNANCY.      03/06/2016 PET scan    PET 03/06/2016 IMPRESSION: Negative PET scan for residual, recurrent or metastatic disease.      03/07/2017 PET scan    PET 03/07/17 IMPRESSION: No evidence of colorectal carcinoma local recurrence or metastasis. Stable exam.       HISTORY OF PRESENTING ILLNESS:  Glenda Stevens 72 y.o. female is here because of recently diagnosed rectal squamous cell carcinoma.  She has been having diarrhea for the past 4 months, she has watery bowel movement 1-2 times daily, with some urgency, no blood, associated with abdominal cramps before the bowel movement. She denies any nausea or vomiting. She has good appetite, no weight loss. No fever or chills. She didn't seek medical attention until a month's ago she was in [place] by her primary care physician. Lab CBC and CMP was unremarkable, stool guaiac was positive. She was referred to gastroenterologist Dr. Ardis Hughs, and underwent her first colonoscopy on 08/01/2014. The colonoscopy showed a total of 14 polyps, 12 polyps were removed, and a distal rectal mass adjacent to the internal and no sphincter was found, which was suspicious for malignancy. Biopsy showed squamous cell carcinoma. Staging scan CT showed no evidence of adenopathy or distant metastasis. She was referred to our cancer  center to discuss chemotherapy and radiation. She is going to see radiation oncologist Dr. Lisbeth Renshaw this afternoon.  CURRENT THERAPY: Surveillance    INTERIM HISTORY  Glenda Stevens returns for follow-up. She was last seen by me 6 months ago. She presents to the clinic today notes she is doing well overall. She notes to having occasional loose stool 1-2 times a day. She rarely has incontinence anymore. She will take imodium as needed. She will have abdominal when she needs to go  to the bathroom and will resolve afterward. She got her flu shot this season. She has not had lunch yet as her blood glucose is low. She was encouraged to eat as soon as possible. She sees Dr. Marcello Moores every 6 months.     MEDICAL HISTORY:  Past Medical History:  Diagnosis Date  . Allergy    Hay fever  . Anxiety   . Arthritis   . Colon cancer ([place]) 08/01/14   rectal squamous cell ca  . GERD (gastroesophageal reflux disease)   . History of chicken pox   . Hyperlipidemia   . Hypertension   . Phlebitis and thrombophlebitis   . PONV (postoperative nausea and vomiting)   . Renal artery stenosis ([facility])    left stent in 2003  . Renovascular hypertension   . S/P radiation therapy 09/05/14-10/19/14   anal ca,50.4Gy    SURGICAL HISTORY: Past Surgical History:  Procedure Laterality Date  . ABDOMINAL HYSTERECTOMY    . BREAST SURGERY     bilateral lumpectomy-benign  . CATARACT EXTRACTION Bilateral   . COLON RESECTION N/A 08/18/2014   Procedure: LAPAROSCOPIC ASSISTED  COLON POLYP RESECTION , DIAGNOSTIC LAPROSCOPY ;  Surgeon: Leighton Ruff, MD;  Location: [facility];  Service: General;  Laterality: N/A;  . NASAL SINUS SURGERY     patient denies having had nasal sinus surgery  . NM MYOCAR PERF WALL MOTION  09/2009   bruce myoview - normal pattern of perfusion, EF 83%, low risk scan  . RENAL ARTERY ANGIOPLASTY  07/02/2001   6x50mm Genesis on Aviator balloon stent overlapping a 6x58mm Genesist on Aviator balloon stent (Dr. Marella Chimes)  . RENAL ARTERY STENT    . RENAL DOPPLER  09/2012   left renal artery stent - 60-99% diameter reduction  . TRANSTHORACIC ECHOCARDIOGRAM  06/2012   EF 55-60%, mild MR    SOCIAL HISTORY: Social History   Socioeconomic History  . Marital status: Divorced    Spouse name: Not on file  . Number of children: 1  . Years of education: 57  . Highest education level: Not on file  Social Needs  . Financial resource strain: Not on file  . Food insecurity - worry: Not on  file  . Food insecurity - inability: Not on file  . Transportation needs - medical: Not on file  . Transportation needs - non-medical: Not on file  Occupational History  . Occupation: Education officer, museum  Tobacco Use  . Smoking status: Current Some Day Smoker    Packs/day: 1.00    Years: 30.00    Pack years: 30.00    Types: Cigarettes  . Smokeless tobacco: Never Used  Substance and Sexual Activity  . Alcohol use: Yes    Alcohol/week: 4.2 oz    Types: 7 Glasses of wine per week  . Drug use: No  . Sexual activity: Not Currently  Other Topics Concern  . Not on file  Social History Narrative   Divorced   1 son, local who lives with her  Retired from [employer] work   Hydrologist grad   Has poodle named "Murphy"    FAMILY HISTORY: Family History  Problem Relation Age of Onset  . Hypertension Mother   . Heart disease Mother   . Dementia Mother   . Hyperlipidemia Mother   . Heart disease Father   . Hyperlipidemia Father   . Cancer Father        lung cancer and prostate cancer   . Hypertension Father   . Breast cancer Cousin   . Cancer Cousin        4 paternal and 1 maternal cousins had breast cancer   . Healthy Brother   . Colon cancer Neg Hx   . Esophageal cancer Neg Hx   . Stomach cancer Neg Hx   . Rectal cancer Neg Hx     ALLERGIES:  is allergic to crestor [rosuvastatin].  MEDICATIONS:  Current Outpatient Medications  Medication Sig Dispense Refill  . ALPRAZolam (XANAX) 0.25 MG tablet Take 0.125 mg by mouth daily as needed for anxiety.    Marland Kitchen aspirin EC 81 MG tablet Take 81 mg by mouth daily.    Marland Kitchen atenolol (TENORMIN) 50 MG tablet TAKE 1 TABLET BY MOUTH  DAILY 90 tablet 2  . candesartan (ATACAND) 32 MG tablet TAKE 1 TABLET BY MOUTH  DAILY 90 tablet 0  . Cholecalciferol (VITAMIN D PO) Take by mouth daily.    . cholecalciferol (VITAMIN D) 1000 units tablet Take 1,000 Units by mouth daily.    Marland Kitchen ketoconazole (NIZORAL) 2 % shampoo Apply 1 application topically 2 (two) times a  week. 120 mL 1  . loperamide (IMODIUM) 2 MG capsule States taking 1/2 tab daily    . Multiple Vitamin (MULTIVITAMIN) tablet Take 1 tablet by mouth daily.    . Omega-3 Fatty Acids (FISH OIL PO) Take 1 tablet by mouth daily.    . ranitidine (ZANTAC) 150 MG tablet Take 150 mg by mouth daily as needed for heartburn.    . sertraline (ZOLOFT) 100 MG tablet Take 100 mg by mouth daily.      Current Facility-Administered Medications  Medication Dose Route Frequency Provider Last Rate Last Dose  . 0.9 %  sodium chloride infusion  500 mL Intravenous Continuous Milus Banister, MD        REVIEW OF SYSTEMS:   Constitutional: Denies fevers, chills or abnormal night sweats Eyes: Denies blurriness of vision, double vision or watery eyes Ears, nose, mouth, throat, and face: Denies mucositis or sore throat Respiratory: Denies cough, dyspnea or wheezes Cardiovascular: Denies palpitation, chest discomfort or lower extremity swelling Gastrointestinal:  Denies nausea, heartburn. (+) Intermittent diarrhea and abdominal pain  Skin: Denies abnormal skin rashes Lymphatics: Denies new lymphadenopathy or easy bruising Neurological:Denies numbness, tingling or new weaknesses Behavioral/Psych: Mood is stable, no new changes  All other systems were reviewed with the patient and are negative.  PHYSICAL EXAMINATION:  ECOG PERFORMANCE STATUS: 0  Vitals:   03/14/17 1320  BP: (!) 156/73  Pulse: 71  Resp: 20  Temp: 98.3 F (36.8 C)  SpO2: 100%   Filed Weights   03/14/17 1320  Weight: 139 lb 9.6 oz (63.3 kg)    GENERAL:alert, no distress and comfortable SKIN: skin color, texture, turgor are normal, no rashes or significant lesions EYES: normal, conjunctiva are pink and non-injected, sclera clear OROPHARYNX:no exudate, no erythema and lips, buccal mucosa, and tongue normal  NECK: supple, thyroid normal size, non-tender, without nodularity LYMPH:  no palpable lymphadenopathy in the  cervical, axillary or  inguinal area. LUNGS: clear to auscultation and percussion with normal breathing effort HEART: regular rate & rhythm and no murmurs and no lower extremity edema ABDOMEN:abdomen soft, non-tender and normal bowel sounds. Rectal exam was deferred today. PSYCH: alert & oriented x 3 with fluent speech NEURO: no focal motor/sensory deficits RECTAL: normal Rectal exam, no palpable mass, no blood on the glove.  LABORATORY DATA:  CBC Latest Ref Rng & Units 03/14/2017 09/17/2016 05/17/2016  WBC 3.9 - 10.3 10e3/uL 6.3 6.4 5.4  Hemoglobin 11.6 - 15.9 g/dL 13.6 13.5 15.7  Hematocrit 34.8 - 46.6 % 40.3 40.2 46.7(H)  Platelets 145 - 400 10e3/uL 211 173 195    CMP Latest Ref Rng & Units 03/14/2017 09/17/2016 05/17/2016  Glucose 70 - 140 mg/dl 62(L) 102 96  BUN 7.0 - 26.0 mg/dL 10.8 6.7(L) 8  Creatinine 0.6 - 1.1 mg/dL 0.8 0.7 0.67  Sodium 136 - 145 mEq/L 137 138 140  Potassium 3.5 - 5.1 mEq/L 3.6 3.9 4.2  Chloride 96 - 106 mmol/L - - 98  CO2 22 - 29 mEq/L 27 28 27   Calcium 8.4 - 10.4 mg/dL 9.2 9.6 9.3  Total Protein 6.4 - 8.3 g/dL 6.5 6.6 6.5  Total Bilirubin 0.20 - 1.20 mg/dL 0.31 0.46 0.3  Alkaline Phos 40 - 150 U/L 53 55 56  AST 5 - 34 U/L 19 23 19   ALT 0 - 55 U/L 14 18 13     PATHOLOGY REPORT: Diagnosis 08/01/2014  1. Colon, biopsy, ascending and cecum, polyp (7) - TUBULAR ADENOMAS (7). NO HIGH GRADE DYSPLASIA OR MALIGNANCY IDENTIFIED. 2. Colon, biopsy, hepatic flexure, polyp (3) - TUBULAR ADENOMAS (3). NO HIGH GRADE DYSPLASIA OR MALIGNANCY IDENTIFIED. 3. Colon, biopsy, transverse - TWO FRAGMENTS OF TUBULAR ADENOMA. NO HIGH GRADE DYSPLASIA OR MALIGNANCY IDENTIFIED. 4. Colon, biopsy, descending, sigmoid - TUBULAR ADENOMAS (3). NO HIGH GRADE DYSPLASIA OR MALIGNANCY IDENTIFIED. 5. Rectum, biopsy, distal mass - SQUAMOUS CELL CARCINOMA, BASALOID TYPE. - SEE MICROSCOPIC DESCRIPTION.  Colon, polyp(s), distal ascending 08/18/2014 - MULTIPLE FRAGMENTS OF TUBULAR ADENOMA. - NO HIGH GRADE DYSPLASIA OR  MALIGNANCY.  Colonoscopy Pathology: Diagnosis 11/10/2015 Surgical [P], ascendingx3, transversex1, polyp (4) - TUBULAR ADENOMA (X3 FRAGMENTS). - BENIGN COLONIC MUCOSA (MULTIPLE FRAGMENTS). - NO HIGH GRADE DYSPLASIA OR MALIGNANCY.  RADIOGRAPHIC STUDIES:  PET 03/07/17 IMPRESSION: No evidence of colorectal carcinoma local recurrence or metastasis. Stable exam.   PET 03/06/2016 IMPRESSION: Negative PET scan for residual, recurrent or metastatic disease.  PET 02/23/2015 IMPRESSION: 1. Interval response to therapy (SUV 4.49. Previously 7.9.). 2. Decrease in FDG uptake associated with anal tumor. 3. No new or progressive disease identified within the chest, abdomen or pelvis.  PROCEDURES Colonoscopy 11/10/2015 - Three 4 to 5 mm polyps in the ascending colon, removed with a hot snare. Resected and retrieved. - One 4 mm polyp in the transverse colon, removed with a cold snare. Resected and retrieved. - The examination was otherwise normal on direct and retroflexion views.   COLONOSCOPY 08/18/2014 ENDOSCOPIC IMPRESSION: In the distal ascending colon, near the hepatic flexure the two sessile polyps were again located. One was 1cm, the other about 1.7cm along a fold. Neither now appeared cancerous. Previous tatoo at the site was somewhat visible. With external pressure and manipulation of the colon by Dr. Marcello Moores using laparscopic technique I was able to resect the polyps. I used saline pressure injector to lift the polyps and then piecemeal snare/cautery technique. I felt there was a small amount of remaining polyp tissue at the  site and I used snare tip cautery to cauterize it. Three other small (2-13mm) clearly adenomatous polyps were removed from the right colon (not retrieved). The examination was otherwise normal RECOMMENDATIONS: Await final pathology, likely you will need repeat colonoscopy in 1 year.  Colonoscopy 08/01/2014 They were adjacent to eachother. They were located on both sides  of a fold, sessile, slightly firm. The largest of the two (18mm) was biopsied and then the site was labled with SPOT via submucosal injection. These biopsies were in jar #3. The other 12 polyp were all sessile, located in cecum, ascending, hepatic flexure, descending and sigmoid. Two were removed with snare cautery (ascending and sigmoid), and the rest removed with cold snare. Sent in three different jars (#1 ascending, cecum), (#2hepatic flexure), (#4 descending, sigmoid). There was also an ulcerated, malignant appearing distal rectal mass that was adjacent to the internal anal sphincter. This was non-cirumferential, 3-5cm across, friable, biopsied extensively (#5). There were 8-10 diminutive polyps spread throughout the colon that I did not remove (2-44mm each). Retroflexed views revealed no abnormalities. The time to cecum = 60min Withdrawal time = 73min The scope was withdrawn and the procedure completed. COMPLICATIONS: There were no immediate complications. ENDOSCOPIC IMPRESSION: Multiple sites of significant neoplasia, see above  ASSESSMENT & PLAN: 72 y.o. Caucasian female with past medical history of hypertension, anxiety and depression, mild arthritis, who presents with watery diarrhea for 4 months.  1. Rectal squamous cell carcinoma, [id number], stage I  -I previously reviewed her colonoscopy findings, the rectal mass biopsy results with patient and her son in great details. -We previously reviewed the natural history of rectal cancer, and staging workup. CT scans reviewed no local adenopathy or distant metastasis. she early stage rectal cancer. -She completed her concurrent chemoradiation, with 2 cycles of full doses of FU and mitomycin. The goal of treatment was curative -I previously reviewed her restaging PET scan from 02/2015, which showed interval response and decreased FDG uptake in the and no mass. No other evidence of recurrence. We previously discussed that the residual hypermetabolic  activity is possible related to her radiation, less likely residual tumor. -Colonoscopy on 11/10/15, revealed three 4-5 mm polyps in the ascending colon and these were removed. Pathology revealed no high grade dysplasia or malignancy. -I reviewed her restaging PET scan from 03/06/16, and this showed no residual, recurrent, or metastatic disease. -I reviewed her restaging PET from 03/07/17 which showed no evidence of residual, recurrent or metastatic disease.  -She is currently doing well, lab results reviewed with her, normal CBC and CMP, no evidence of recurrence. Rectal exam unremarkable during this encounter. -We will continue cancer surveillance. She is over 2 years since diagnosis. After 3 years the risk of recurrence will decrease significantly.  -F/u with Dr. Marcello Moores in 3 months and f/u with me in 6 months.    2. Smoking cessation -I strongly encouraged her to stop smoking, she is willing to cut back.  3. Hypertension, anxiety -She'll continue follow-up with her primary care physician  4. Loose stools -Managed with imodium -Advised the patient that too much imodium could cause constipation. The patient is aware of this. -I encouraged her to do pelvic exercise  PLAN -Lab and PET scan reviewed, no evidence of recurrence, continue surveillance. -lab and f/u in 6 months  -She will follow-up with Dr. Marcello Moores in 3 months  All questions were answered. The patient knows to call the clinic with any problems, questions or concerns.  I spent 20 minutes counseling the patient  face to face. The total time spent in the appointment was 25 minutes and more than 50% was on counseling.     Truitt Merle, MD 03/14/2017 11:53 PM   This document serves as a record of services personally performed by Truitt Merle, MD. It was created on her behalf by Joslyn Devon, a trained medical scribe. The creation of this record is based on the scribe's personal observations and the provider's statements to them.    I have  reviewed the above documentation for accuracy and completeness, and I agree with the above.

## 2017-03-14 ENCOUNTER — Other Ambulatory Visit (HOSPITAL_BASED_OUTPATIENT_CLINIC_OR_DEPARTMENT_OTHER): Payer: Medicare Other

## 2017-03-14 ENCOUNTER — Encounter: Payer: Self-pay | Admitting: Hematology

## 2017-03-14 ENCOUNTER — Telehealth: Payer: Self-pay | Admitting: Hematology

## 2017-03-14 ENCOUNTER — Ambulatory Visit (HOSPITAL_BASED_OUTPATIENT_CLINIC_OR_DEPARTMENT_OTHER): Payer: Medicare Other | Admitting: Hematology

## 2017-03-14 VITALS — BP 156/73 | HR 71 | Temp 98.3°F | Resp 20 | Ht 63.0 in | Wt 139.6 lb

## 2017-03-14 DIAGNOSIS — Z720 Tobacco use: Secondary | ICD-10-CM

## 2017-03-14 DIAGNOSIS — C20 Malignant neoplasm of rectum: Secondary | ICD-10-CM

## 2017-03-14 LAB — COMPREHENSIVE METABOLIC PANEL
ALT: 14 U/L (ref 0–55)
AST: 19 U/L (ref 5–34)
Albumin: 3.5 g/dL (ref 3.5–5.0)
Alkaline Phosphatase: 53 U/L (ref 40–150)
Anion Gap: 6 mEq/L (ref 3–11)
BILIRUBIN TOTAL: 0.31 mg/dL (ref 0.20–1.20)
BUN: 10.8 mg/dL (ref 7.0–26.0)
CHLORIDE: 104 meq/L (ref 98–109)
CO2: 27 meq/L (ref 22–29)
Calcium: 9.2 mg/dL (ref 8.4–10.4)
Creatinine: 0.8 mg/dL (ref 0.6–1.1)
Glucose: 62 mg/dl — ABNORMAL LOW (ref 70–140)
Potassium: 3.6 mEq/L (ref 3.5–5.1)
Sodium: 137 mEq/L (ref 136–145)
TOTAL PROTEIN: 6.5 g/dL (ref 6.4–8.3)

## 2017-03-14 LAB — CBC WITH DIFFERENTIAL/PLATELET
BASO%: 0.5 % (ref 0.0–2.0)
BASOS ABS: 0 10*3/uL (ref 0.0–0.1)
EOS ABS: 0.1 10*3/uL (ref 0.0–0.5)
EOS%: 2.2 % (ref 0.0–7.0)
HEMATOCRIT: 40.3 % (ref 34.8–46.6)
HEMOGLOBIN: 13.6 g/dL (ref 11.6–15.9)
LYMPH#: 0.6 10*3/uL — AB (ref 0.9–3.3)
LYMPH%: 10 % — ABNORMAL LOW (ref 14.0–49.7)
MCH: 31.9 pg (ref 25.1–34.0)
MCHC: 33.7 g/dL (ref 31.5–36.0)
MCV: 94.6 fL (ref 79.5–101.0)
MONO#: 0.5 10*3/uL (ref 0.1–0.9)
MONO%: 7.9 % (ref 0.0–14.0)
NEUT#: 5 10*3/uL (ref 1.5–6.5)
NEUT%: 79.4 % — AB (ref 38.4–76.8)
PLATELETS: 211 10*3/uL (ref 145–400)
RBC: 4.26 10*6/uL (ref 3.70–5.45)
RDW: 12.9 % (ref 11.2–14.5)
WBC: 6.3 10*3/uL (ref 3.9–10.3)

## 2017-03-14 NOTE — Telephone Encounter (Signed)
Gave avs and calendar for february °

## 2017-04-17 IMAGING — CT CT CHEST W/ CM
2 of 5 series · 15 of 36 positions shown, 18 images · IV contrast (omnipaque)
Comparison: None.

CLINICAL DATA: Newly diagnosed squamous cell carcinoma of the anus.
Multiple colonic polyps. History of hyperlipidemia. Gastroesophageal
reflux disease.

EXAM:
CT CHEST, ABDOMEN, AND PELVIS WITH CONTRAST
TECHNIQUE: Multidetector CT imaging of the chest, abdomen and pelvis was
performed following the standard protocol during bolus
administration of intravenous contrast.
CONTRAST:  100 cc of Omnipaque 300

[Series 2: cap with · axial · 0.68mm/px · z∈[-590,-60]mm · 12 of 122 slices shown, 15 images]
[im 8/122  mediastinal]
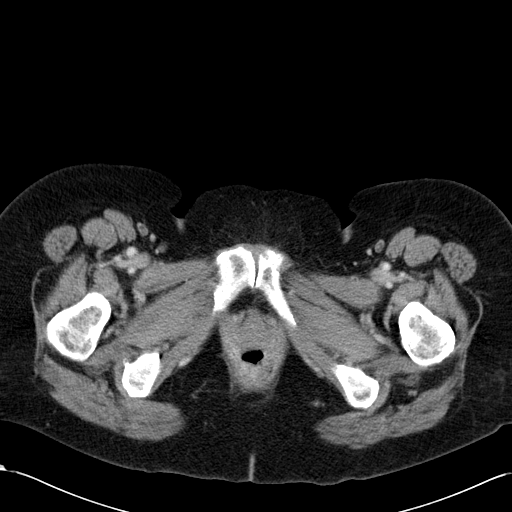
[im 8/122  lung]
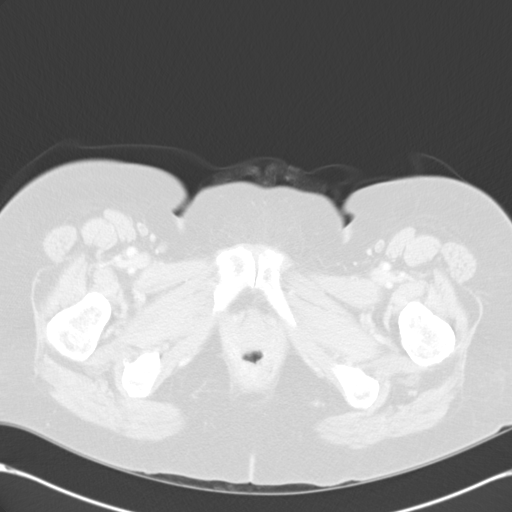
[im 16/122  lung]
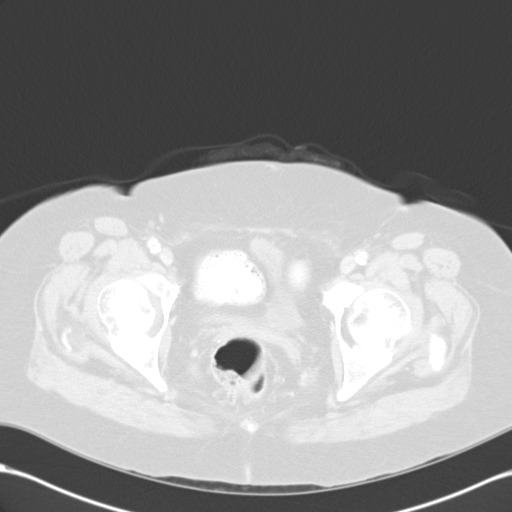
[im 31/122  lung]
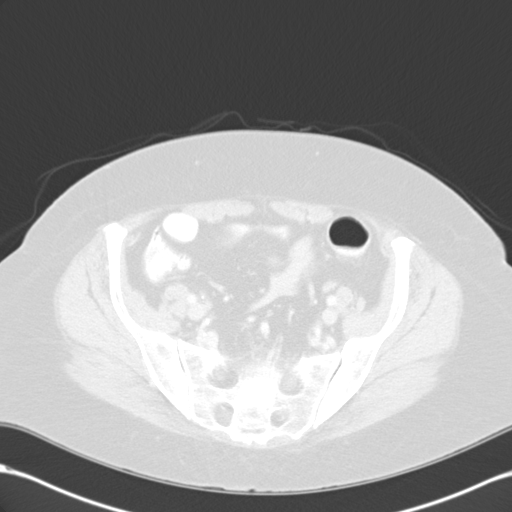
[im 38/122  lung]
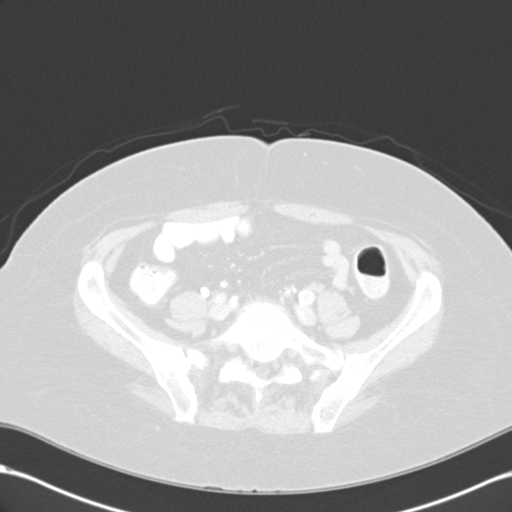
[im 46/122  mediastinal]
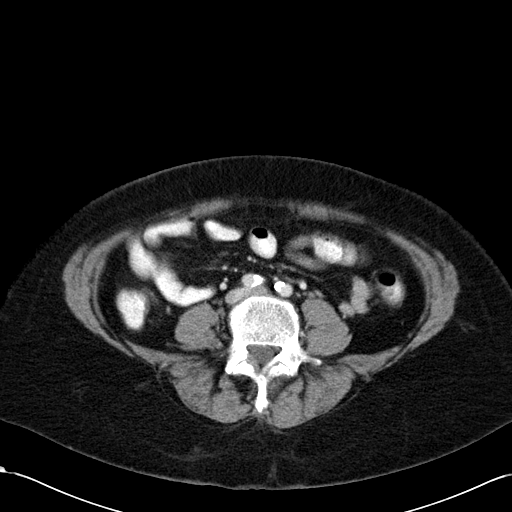
[im 46/122  lung]
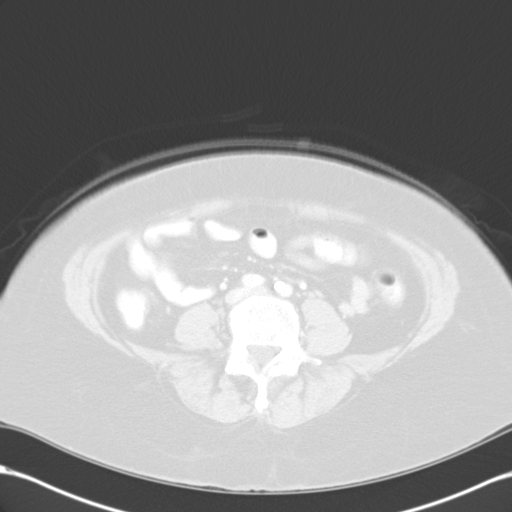
[im 53/122  lung]
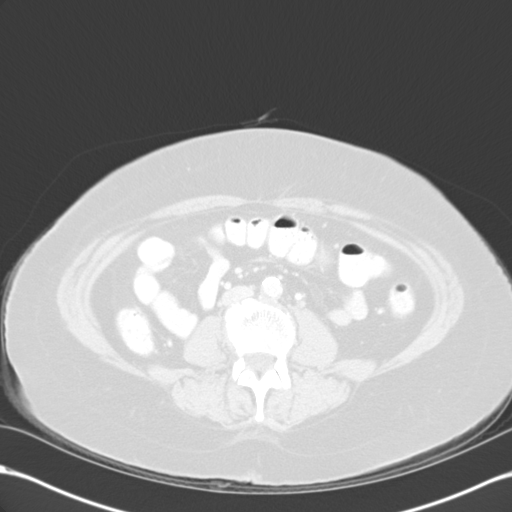
[im 69/122  lung]
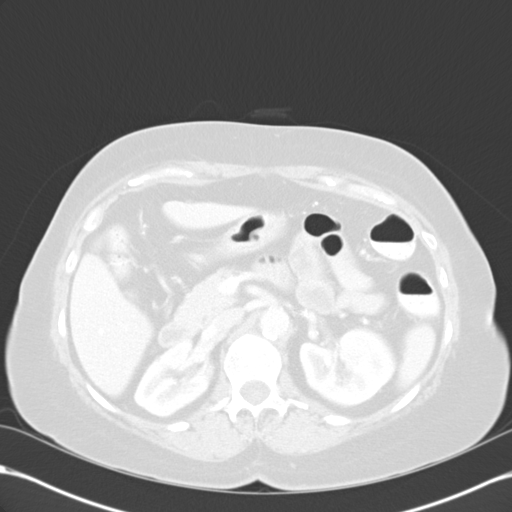
[im 76/122  lung]
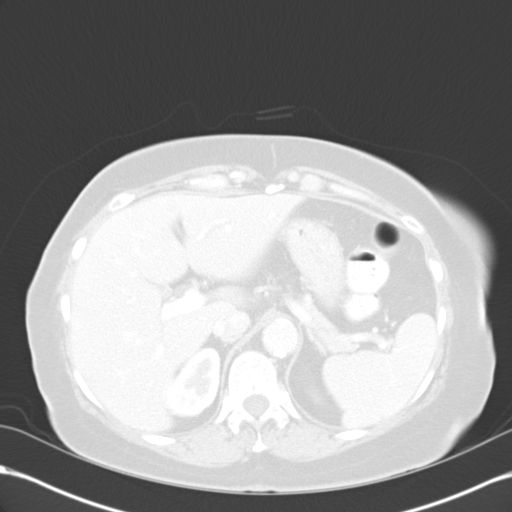
[im 84/122  mediastinal]
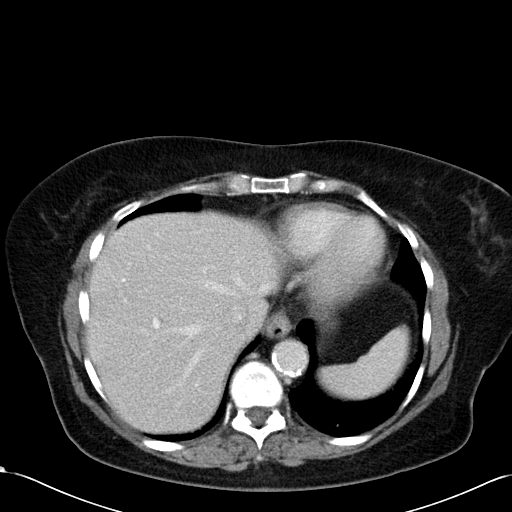
[im 84/122  lung]
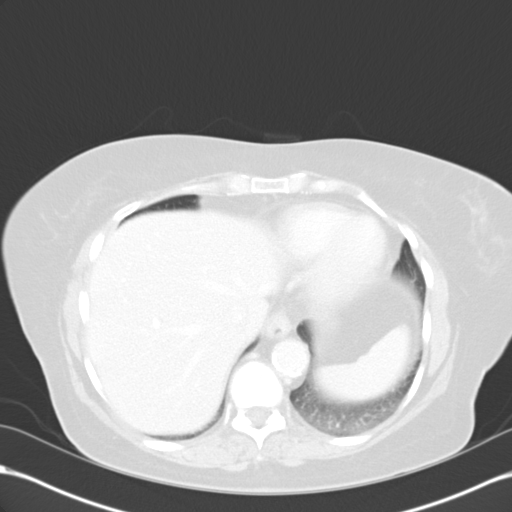
[im 91/122  lung]
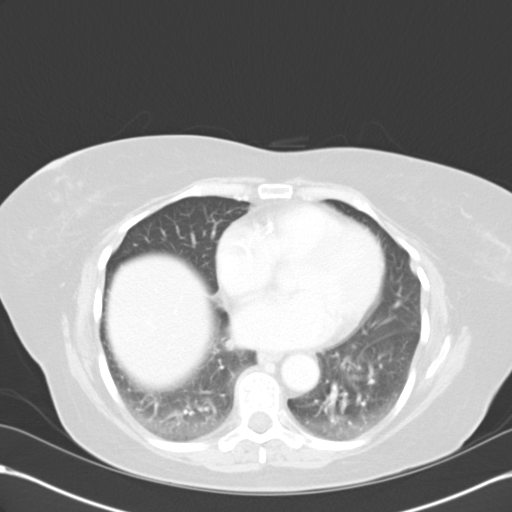
[im 106/122  lung]
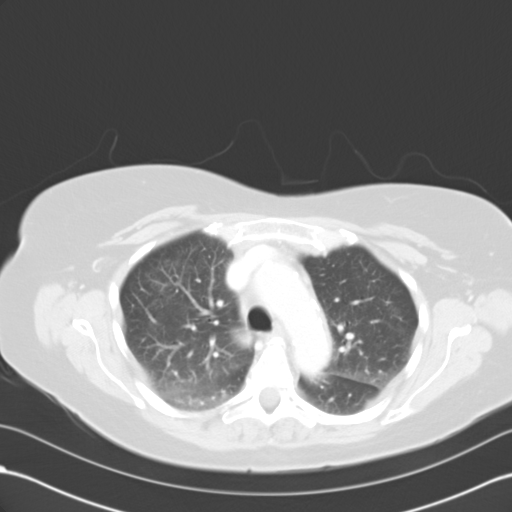
[im 114/122  lung]
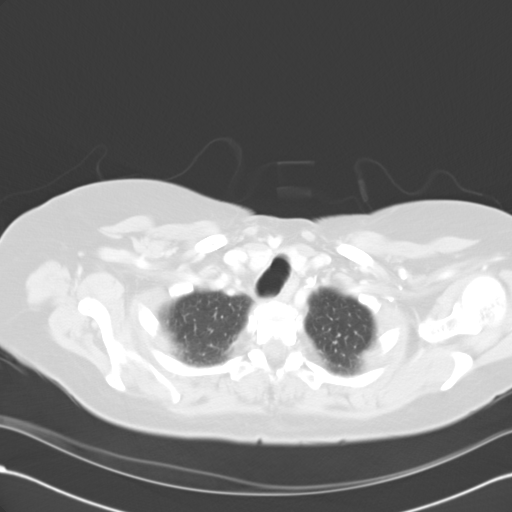

[Series 602: cor · coronal · 1.23mm/px · 3 of 126 slices shown]
[im 26/126  lung]
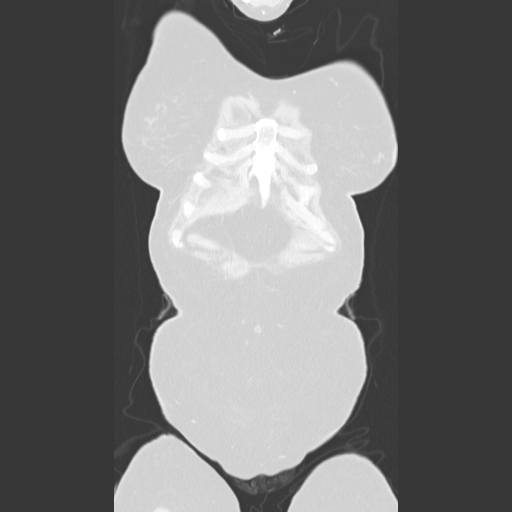
[im 51/126  lung]
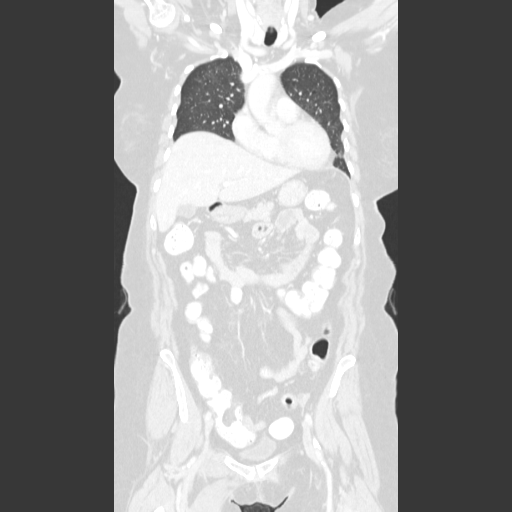
[im 76/126  lung]
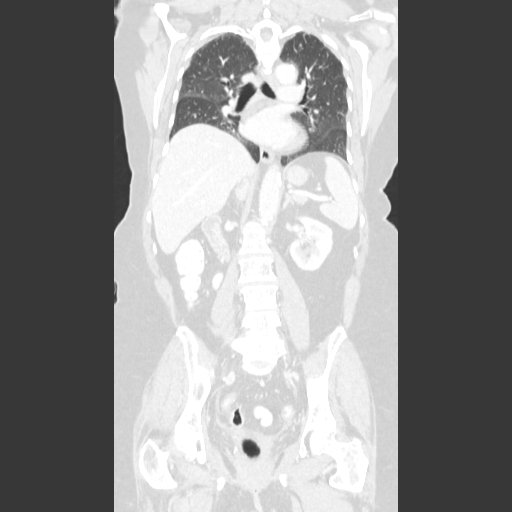

[15 of 36 positions shown; findings below may reference images not displayed]

FINDINGS: CT CHEST FINDINGS

Mediastinum/Nodes: No supraclavicular adenopathy. Aortic and branch
vessel atherosclerosis. Mild cardiomegaly. LAD and right coronary
artery atherosclerosis. No pericardial effusion. No central
pulmonary embolism, on this non-dedicated study. No mediastinal or
hilar adenopathy.

Lungs/Pleura: No pleural fluid. Dependent bibasilar atelectasis. No
pulmonary nodule or consolidation.

Musculoskeletal: No acute osseous abnormality.

CT ABDOMEN PELVIS FINDINGS

Hepatobiliary: Normal liver. Normal gallbladder, without biliary
ductal dilatation.

Pancreas: Normal, without mass or ductal dilatation.

Spleen: Normal

Adrenals/Urinary Tract: Normal adrenal glands. Normal kidneys,
without hydronephrosis. Normal urinary bladder.

Stomach/Bowel: Underdistended proximal stomach. No dominant anal
primary identified. Normal perianal and perirectal fat planes. No
bowel obstruction. Mild motion degradation in the lower abdomen and
pelvis. Cecum extends into the right central pelvis. Normal terminal
ileum. Appendix may be diminutive on image 105. No surrounding
inflammation. Normal small bowel.

Vascular/Lymphatic: Advanced atherosclerosis. Left renal artery
stent. No abdominopelvic adenopathy.

Reproductive: Hysterectomy.  No adnexal mass.

Other: No significant free fluid. No evidence of omental or
peritoneal disease.

Musculoskeletal: No acute osseous abnormality. L4-5 and L3-4 disc
bulges.
IMPRESSION: 1. No acute process or evidence of metastatic disease in the chest,
abdomen, or pelvis.
2. Mild motion degradation involving the abdomen and upper pelvis.
3. Advanced atherosclerosis, including within the coronary arteries.

## 2017-05-01 ENCOUNTER — Encounter: Payer: Self-pay | Admitting: Adult Health

## 2017-05-01 ENCOUNTER — Ambulatory Visit (INDEPENDENT_AMBULATORY_CARE_PROVIDER_SITE_OTHER): Payer: Medicare Other | Admitting: Adult Health

## 2017-05-01 VITALS — BP 132/81 | HR 68 | Ht 62.99 in | Wt 141.0 lb

## 2017-05-01 DIAGNOSIS — I10 Essential (primary) hypertension: Secondary | ICD-10-CM

## 2017-05-01 DIAGNOSIS — F17200 Nicotine dependence, unspecified, uncomplicated: Secondary | ICD-10-CM | POA: Diagnosis not present

## 2017-05-01 DIAGNOSIS — C20 Malignant neoplasm of rectum: Secondary | ICD-10-CM | POA: Diagnosis not present

## 2017-05-01 DIAGNOSIS — F419 Anxiety disorder, unspecified: Secondary | ICD-10-CM | POA: Diagnosis not present

## 2017-05-01 DIAGNOSIS — Z0000 Encounter for general adult medical examination without abnormal findings: Secondary | ICD-10-CM

## 2017-05-01 DIAGNOSIS — E7849 Other hyperlipidemia: Secondary | ICD-10-CM

## 2017-05-01 MED ORDER — CANDESARTAN CILEXETIL 32 MG PO TABS: 32.0000 mg | ORAL_TABLET | Freq: Every day | ORAL | 2 refills | Status: DC

## 2017-05-01 NOTE — Assessment & Plan Note (Signed)
  Please continue all medications as directed. When you need refills, please contact your pharmacy. Increase water intake, strive for at least 70 ounces/day.   Follow Heart Healthy diet Increase regular exercise.  Recommend at least 30 minutes daily, 5 days per week of walking, jogging, biking, swimming, YouTube/Pinterest workout videos. Recommend taking [name] to walking track at church daily. Please schedule fasting lab appt next week, will advise about atorvastatin after results are available. Annual physical with fasting labs.

## 2017-05-01 NOTE — Assessment & Plan Note (Signed)
Stable

## 2017-05-01 NOTE — Assessment & Plan Note (Signed)
Declined smoking cessation

## 2017-05-01 NOTE — Patient Instructions (Addendum)
Heart-Healthy Eating Plan Many factors influence your heart health, including eating and exercise habits. Heart (coronary) risk increases with abnormal blood fat (lipid) levels. Heart-healthy meal planning includes limiting unhealthy fats, increasing healthy fats, and making other small dietary changes. This includes maintaining a healthy body weight to help keep lipid levels within a normal range. What is my plan? Your health care provider recommends that you:  Get no more than __25___% of the total calories in your daily diet from fat.  Limit your intake of saturated fat to less than ___5___% of your total calories each day.  Limit the amount of cholesterol in your diet to less than _300_ mg per day.  What types of fat should I choose?  Choose healthy fats more often. Choose monounsaturated and polyunsaturated fats, such as olive oil and canola oil, flaxseeds, walnuts, almonds, and seeds.  Eat more omega-3 fats. Good choices include salmon, mackerel, sardines, tuna, flaxseed oil, and ground flaxseeds. Aim to eat fish at least two times each week.  Limit saturated fats. Saturated fats are primarily found in animal products, such as meats, butter, and cream. Plant sources of saturated fats include palm oil, palm kernel oil, and coconut oil.  Avoid foods with partially hydrogenated oils in them. These contain trans fats. Examples of foods that contain trans fats are stick margarine, some tub margarines, cookies, crackers, and other baked goods. What general guidelines do I need to follow?  Check food labels carefully to identify foods with trans fats or high amounts of saturated fat.  Fill one half of your plate with vegetables and green salads. Eat 4-5 servings of vegetables per day. A serving of vegetables equals 1 cup of raw leafy vegetables,  cup of raw or cooked cut-up vegetables, or  cup of vegetable juice.  Fill one fourth of your plate with whole grains. Look for the word "whole"  as the first word in the ingredient list.  Fill one fourth of your plate with lean protein foods.  Eat 4-5 servings of fruit per day. A serving of fruit equals one medium whole fruit,  cup of dried fruit,  cup of fresh, frozen, or canned fruit, or  cup of 100% fruit juice.  Eat more foods that contain soluble fiber. Examples of foods that contain this type of fiber are apples, broccoli, carrots, beans, peas, and barley. Aim to get 20-30 g of fiber per day.  Eat more home-cooked food and less restaurant, buffet, and fast food.  Limit or avoid alcohol.  Limit foods that are high in starch and sugar.  Avoid fried foods.  Cook foods by using methods other than frying. Baking, boiling, grilling, and broiling are all great options. Other fat-reducing suggestions include: ? Removing the skin from poultry. ? Removing all visible fats from meats. ? Skimming the fat off of stews, soups, and gravies before serving them. ? Steaming vegetables in water or broth.  Lose weight if you are overweight. Losing just 5-10% of your initial body weight can help your overall health and prevent diseases such as diabetes and heart disease.  Increase your consumption of nuts, legumes, and seeds to 4-5 servings per week. One serving of dried beans or legumes equals  cup after being cooked, one serving of nuts equals 1 ounces, and one serving of seeds equals  ounce or 1 tablespoon.  You may need to monitor your salt (sodium) intake, especially if you have high blood pressure. Talk with your health care provider or dietitian to get  more information about reducing sodium. What foods can I eat? Grains  Breads, including [place], white, pita, wheat, raisin, rye, oatmeal, and [place]. Tortillas that are neither fried nor made with lard or trans fat. Low-fat rolls, including hotdog and hamburger buns and English muffins. Biscuits. Muffins. Waffles. Pancakes. Light popcorn. Whole-grain cereals. Flatbread. Melba  toast. Pretzels. Breadsticks. Rusks. Low-fat snacks and crackers, including oyster, saltine, matzo, graham, animal, and rye. Rice and pasta, including brown rice and those that are made with whole wheat. Vegetables All vegetables. Fruits All fruits, but limit coconut. Meats and Other Protein Sources Lean, well-trimmed beef, veal, pork, and lamb. Chicken and [place] without skin. All fish and shellfish. Wild duck, rabbit, pheasant, and venison. Egg whites or low-cholesterol egg substitutes. Dried beans, peas, lentils, and tofu.Seeds and most nuts. Dairy Low-fat or nonfat cheeses, including ricotta, string, and mozzarella. Skim or 1% milk that is liquid, powdered, or evaporated. Buttermilk that is made with low-fat milk. Nonfat or low-fat yogurt. Beverages Mineral water. Diet carbonated beverages. Sweets and Desserts Sherbets and fruit ices. Honey, jam, marmalade, jelly, and syrups. Meringues and gelatins. Pure sugar candy, such as hard candy, jelly beans, gumdrops, mints, marshmallows, and small amounts of dark chocolate. [employer] Eat all sweets and desserts in moderation. Fats and Oils Nonhydrogenated (trans-free) margarines. Vegetable oils, including soybean, sesame, sunflower, olive, peanut, safflower, corn, canola, and cottonseed. Salad dressings or mayonnaise that are made with a vegetable oil. Limit added fats and oils that you use for cooking, baking, salads, and as spreads. Other Cocoa powder. Coffee and tea. All seasonings and condiments. The items listed above may not be a complete list of recommended foods or beverages. Contact your dietitian for more options. What foods are not recommended? Grains Breads that are made with saturated or trans fats, oils, or whole milk. Croissants. Butter rolls. Cheese breads. Sweet rolls. Donuts. Buttered popcorn. Chow mein noodles. High-fat crackers, such as cheese or butter crackers. Meats and Other Protein Sources Fatty meats, such as  hotdogs, short ribs, sausage, spareribs, bacon, ribeye roast or steak, and mutton. High-fat deli meats, such as salami and bologna. Caviar. Domestic duck and goose. Organ meats, such as kidney, liver, sweetbreads, brains, gizzard, chitterlings, and heart. Dairy Cream, sour cream, cream cheese, and creamed cottage cheese. Whole milk cheeses, including blue (bleu), Monterey Jack, [place], [place], American, [place], Swiss, [place], [place], and [place]. Whole or 2% milk that is liquid, evaporated, or condensed. Whole buttermilk. Cream sauce or high-fat cheese sauce. Yogurt that is made from whole milk. Beverages Regular sodas and drinks with added sugar. Sweets and Desserts Frosting. Pudding. Cookies. Cakes other than angel food cake. Candy that has milk chocolate or white chocolate, hydrogenated fat, butter, coconut, or unknown ingredients. Buttered syrups. Full-fat ice cream or ice cream drinks. Fats and Oils Gravy that has suet, meat fat, or shortening. Cocoa butter, hydrogenated oils, palm oil, coconut oil, palm kernel oil. These can often be found in baked products, candy, fried foods, nondairy creamers, and whipped toppings. Solid fats and shortenings, including bacon fat, salt pork, lard, and butter. Nondairy cream substitutes, such as coffee creamers and sour cream substitutes. Salad dressings that are made of unknown oils, cheese, or sour cream. The items listed above may not be a complete list of foods and beverages to avoid. Contact your dietitian for more information. This information is not intended to replace advice given to you by your health care provider. Make sure you discuss any questions you have with your health care  provider. Document Released: 12/05/2007 Document Revised: 09/15/2015 Document Reviewed: 08/19/2013 Elsevier Interactive Patient Education  [employer].  Please continue all medications as directed. When you need refills, please contact your pharmacy. Increase  water intake, strive for at least 70 ounces/day.   Follow Heart Healthy diet Increase regular exercise.  Recommend at least 30 minutes daily, 5 days per week of walking, jogging, biking, swimming, YouTube/Pinterest workout videos. Recommend taking [name] to walking track at church daily. Please schedule fasting lab appt next week, will advise about atorvastatin after results are available. Annual physical with fasting labs. NICE TO SEE YOU!

## 2017-05-01 NOTE — Progress Notes (Signed)
Subjective:    Patient ID: Glenda Stevens, female    DOB: 1946/02/12, 72 y.o.   MRN: [id number]  HPI: 05/09/16 OV: [name] is here to establish as a new pt.  She is a very, very pleasant 72 year old woman.  PMH:  Renal artery stenosis, hyperlipidemia, HTN, tobacco use (pack/day for > 30 years), rectal ca (remission now, followed by [facility]), Anxiety, bil hip/foot pain (4/10-6/10 each morning that improves with daily walking), and "rash on chest" that developed last summer.  She is compliant on all medications and denies SE.  She declined smoking cessation today.  She lives with her adult son and is "very content in life"  She has a dog, "Mr. [name]" whom she walks 60 mins daily-GREAT!  05/01/17 OV: [name] presents for CPE. She reports medication compliance and denies SE Lipid Panel 3/18- LDL 221, Atorvastatin 40mg  nightly was started and she only took 3-4 doses, but is open to re-starting rx. She walks 1 mile/day with her dog "[name]" and follows a heart healthy diet, "as much as I can, my son is a Biomedical scientist". She denies thoughts of harming herself/others. She continues to smoke pack/day, declined smoking cessation   Healthcare Maintenance: PAP-not indicated Mammogram- august 2018 followed by breast US Jan 2019 mammogram,  recommend repeat in 6 months Colonoscopy- followed by GI, hx of recta ca LDCT- has PET scan annually, last imaging was 03/07/17- CHEST "No hypermetabolic mediastinal or hilar nodes. No suspicious pulmonary nodules on the CT scan" DEXA- declined   Patient Care Team    Relationship Specialty Notifications Start End  [name] D, NP PCP - General Family Medicine  11/11/21   Leighton Ruff, MD Consulting Physician General Surgery  10/24/14   Kyung Rudd, MD Consulting Physician Radiation Oncology  10/24/14     Patient Active Problem List   Diagnosis Date Noted  . Healthcare maintenance 05/01/2017  . Corneal abrasion, right 10/17/2016  . Vitamin D deficiency  07/18/2016  . Other fatigue 05/09/2016  . Bilateral hip pain 05/09/2016  . Tinea corporis 05/09/2016  . Foot pain, bilateral 05/09/2016  . Medicare annual wellness visit, initial 07/06/2015  . Anxiety 07/06/2015  . Rectal cancer ([place]) 08/01/2014  . Diarrhea 07/08/2014  . Seborrheic keratoses 03/18/2014  . Nasal congestion 09/14/2013  . Smoker 09/14/2013  . Renal artery stenosis ([place]) 08/06/2013  . Hyperlipidemia 08/06/2013  . Essential hypertension 08/06/2013     Past Medical History:  Diagnosis Date  . Allergy    Hay fever  . Anxiety   . Arthritis   . 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 GERD (gastroesophageal reflux disease)   . History of chicken pox   . Hyperlipidemia   . Hypertension   . Phlebitis and thrombophlebitis   . PONV (postoperative nausea and vomiting)   . Renal artery stenosis ([facility])    left stent in 2003  . Renovascular hypertension   . 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 ABDOMINAL HYSTERECTOMY    . BREAST SURGERY     bilateral lumpectomy-benign  . CATARACT EXTRACTION Bilateral   . COLON RESECTION N/A 08/18/2014   Procedure: LAPAROSCOPIC ASSISTED  COLON POLYP RESECTION , DIAGNOSTIC LAPROSCOPY ;  Surgeon: Leighton Ruff, MD;  Location: [facility];  Service: General;  Laterality: N/A;  . NASAL SINUS SURGERY     patient denies having had nasal sinus surgery  .  NM MYOCAR PERF WALL MOTION  09/2009   bruce myoview - normal pattern of perfusion, EF 83%, low risk scan  . RENAL ARTERY ANGIOPLASTY  07/02/2001   6x91mm Genesis on Aviator balloon stent overlapping a 6x50mm Genesist on Aviator balloon stent (Dr. Marella Chimes)  . RENAL ARTERY STENT    . RENAL DOPPLER  09/2012   left renal artery stent - 60-99% diameter reduction  . TRANSTHORACIC ECHOCARDIOGRAM  06/2012   EF 55-60%, mild MR     Family History  Problem Relation Age of Onset  . Hypertension Mother   .  Heart disease Mother   . Dementia Mother   . Hyperlipidemia Mother   . Heart disease Father   . Hyperlipidemia Father   . Cancer Father        lung cancer and prostate cancer   . Hypertension Father   . Breast cancer Cousin   . Cancer Cousin        4 paternal and 1 maternal cousins had breast cancer   . Healthy Brother   . Colon cancer Neg Hx   . Esophageal cancer Neg Hx   . Stomach cancer Neg Hx   . Rectal cancer Neg Hx      Social History   Substance and Sexual Activity  Drug Use No     Social History   Substance and Sexual Activity  Alcohol Use Yes  . Alcohol/week: 4.2 oz  . Types: 7 Glasses of wine per week     Social History   Tobacco Use  Smoking Status Current Some Day Smoker  . Packs/day: 1.00  . Years: 30.00  . Pack years: 30.00  . Types: Cigarettes  Smokeless Tobacco Never Used     Outpatient Encounter Medications as of 05/01/2017  Medication Sig Note  . ALPRAZolam (XANAX) 0.25 MG tablet Take 0.125 mg by mouth daily as needed for anxiety.   Marland Kitchen aspirin EC 81 MG tablet Take 81 mg by mouth daily.   Marland Kitchen atenolol (TENORMIN) 50 MG tablet TAKE 1 TABLET BY MOUTH  DAILY   . candesartan (ATACAND) 32 MG tablet Take 1 tablet (32 mg total) by mouth daily.   . Cholecalciferol (VITAMIN D PO) Take by mouth daily. 12/13/2016: Patient is not sure the strength  . cholecalciferol (VITAMIN D) 1000 units tablet Take 1,000 Units by mouth daily.   Marland Kitchen ketoconazole (NIZORAL) 2 % shampoo Apply 1 application topically 2 (two) times a week.   . loperamide (IMODIUM) 2 MG capsule States taking 1/2 tab daily   . Multiple Vitamin (MULTIVITAMIN) tablet Take 1 tablet by mouth daily.   . Omega-3 Fatty Acids (FISH OIL PO) Take 1 tablet by mouth daily.   . ranitidine (ZANTAC) 150 MG tablet Take 150 mg by mouth daily as needed for heartburn.   . sertraline (ZOLOFT) 100 MG tablet Take 100 mg by mouth daily.    . [DISCONTINUED] candesartan (ATACAND) 32 MG tablet TAKE 1 TABLET BY MOUTH  DAILY     Facility-Administered Encounter Medications as of 05/01/2017  Medication  . 0.9 %  sodium chloride infusion    Allergies: Crestor [rosuvastatin]  Body mass index is 24.98 kg/m.  Blood pressure 132/81, pulse 68, height 5' 2.99" (1.6 m), weight 141 lb (64 kg), SpO2 100 %.   Review of Systems  Constitutional: Negative for activity change, appetite change, chills, diaphoresis, fatigue, fever and unexpected weight change.  HENT: Negative for congestion.   Eyes: Negative for visual disturbance.  Respiratory: Negative for cough,  shortness of breath, wheezing and stridor.   Cardiovascular: Negative for chest pain, palpitations and leg swelling.  Gastrointestinal: Negative for abdominal distention, abdominal pain, anal bleeding, constipation, diarrhea, nausea, rectal pain and vomiting.       Diarrhea frequency greatly reduced Denies blood in urine/stool  Endocrine: Negative for cold intolerance, heat intolerance, polydipsia, polyphagia and polyuria.  Genitourinary: Negative for difficulty urinating, dysuria and flank pain.  Musculoskeletal: Positive for arthralgias and myalgias. Negative for back pain, gait problem, joint swelling, neck pain and neck stiffness.       BIl hip/foot pain that is worse in the am and improves with movement throughout the day. She has never seen Ortho.  She does not use OTC pain medications (i.e. Acetaminophen, Ibuprofen).  Skin: Negative for color change, pallor, rash and wound.  Neurological: Negative for dizziness, tremors, weakness, light-headedness and headaches.  Psychiatric/Behavioral: Negative for agitation, behavioral problems, confusion, self-injury, sleep disturbance and suicidal ideas. The patient is not nervous/anxious.        Objective:   Physical Exam  Constitutional: She is oriented to person, place, and time. She appears well-developed and well-nourished. No distress.  HENT:  Head: Normocephalic and atraumatic.  Right Ear: External ear  normal.  Left Ear: External ear normal.  Eyes: Conjunctivae and EOM are normal. Pupils are equal, round, and reactive to light.  Neck: Normal range of motion. Neck supple.  Cardiovascular: Normal rate, regular rhythm, normal heart sounds and intact distal pulses.  Pulmonary/Chest: Effort normal and breath sounds normal. No respiratory distress. She has no wheezes. She has no rales. She exhibits no tenderness.  Abdominal: Soft. Bowel sounds are normal. She exhibits no distension and no mass. There is no tenderness. There is no rebound and no guarding.  Musculoskeletal: Normal range of motion. She exhibits tenderness.       Right hip: She exhibits tenderness. She exhibits normal range of motion and normal strength.       Left hip: She exhibits tenderness. She exhibits normal range of motion and normal strength.       Right ankle: She exhibits normal range of motion, no swelling and normal pulse. Tenderness. Achilles tendon exhibits no pain.       Left ankle: She exhibits normal range of motion, no swelling and normal pulse. Tenderness. Achilles tendon exhibits no pain.  Lymphadenopathy:    She has no cervical adenopathy.  Neurological: She is alert and oriented to person, place, and time. She has normal reflexes.  Skin: Skin is warm and dry. No rash noted. She is not diaphoretic. No erythema. No pallor.  Psychiatric: She has a normal mood and affect. Her behavior is normal. Judgment and thought content normal.  Nursing note and vitals reviewed.         Assessment & Plan:   1. Other hyperlipidemia   2. Essential hypertension   3. Healthcare maintenance   4. Rectal cancer ([place])   5. Anxiety   6. Smoker     Essential hypertension BP at goal 132/81, HR 68 Continue Candesartan 32mg  QD Atenolol 50mg  QD She denies acute cardiac sx's  Rectal cancer ([facility]) Followed by GI  Anxiety Stable  Hyperlipidemia She only took 3-4 doses of Atorvastatin 40mg  3/18 Will re-check lipids next  week Continue to follow heart healthy diet and increase daily walking  Smoker Declined smoking cessation   Healthcare maintenance  Please continue all medications as directed. When you need refills, please contact your pharmacy. Increase water intake, strive for at least 70  ounces/day.   Follow Heart Healthy diet Increase regular exercise.  Recommend at least 30 minutes daily, 5 days per week of walking, jogging, biking, swimming, YouTube/Pinterest workout videos. Recommend taking [name] to walking track at church daily. Please schedule fasting lab appt next week, will advise about atorvastatin after results are available. Annual physical with fasting labs.    FOLLOW-UP:  Return in about 1 year (around 05/01/2018) for CPE, Fasting Labs.

## 2017-05-01 NOTE — Assessment & Plan Note (Signed)
Followed by GI

## 2017-05-01 NOTE — Assessment & Plan Note (Signed)
BP at goal 132/81, HR 68 Continue Candesartan 32mg  QD Atenolol 50mg  QD She denies acute cardiac sx's

## 2017-05-01 NOTE — Assessment & Plan Note (Signed)
She only took 3-4 doses of Atorvastatin 40mg  3/18 Will re-check lipids next week Continue to follow heart healthy diet and increase daily walking

## 2017-05-05 ENCOUNTER — Encounter: Payer: Self-pay | Admitting: Adult Health

## 2017-05-07 ENCOUNTER — Other Ambulatory Visit: Payer: Self-pay

## 2017-05-07 ENCOUNTER — Other Ambulatory Visit: Payer: Medicare Other

## 2017-05-07 DIAGNOSIS — B354 Tinea corporis: Secondary | ICD-10-CM

## 2017-05-07 DIAGNOSIS — L821 Other seborrheic keratosis: Secondary | ICD-10-CM

## 2017-05-08 ENCOUNTER — Other Ambulatory Visit: Payer: Medicare Other

## 2017-05-12 ENCOUNTER — Other Ambulatory Visit: Payer: Self-pay

## 2017-05-12 ENCOUNTER — Other Ambulatory Visit: Payer: Medicare Other

## 2017-05-12 DIAGNOSIS — R5383 Other fatigue: Secondary | ICD-10-CM

## 2017-05-12 DIAGNOSIS — E7849 Other hyperlipidemia: Secondary | ICD-10-CM

## 2017-05-12 DIAGNOSIS — Z0000 Encounter for general adult medical examination without abnormal findings: Secondary | ICD-10-CM

## 2017-05-12 DIAGNOSIS — E559 Vitamin D deficiency, unspecified: Secondary | ICD-10-CM

## 2017-05-12 DIAGNOSIS — I10 Essential (primary) hypertension: Secondary | ICD-10-CM

## 2017-05-13 LAB — COMPREHENSIVE METABOLIC PANEL
ALT: 13 IU/L (ref 0–32)
AST: 20 IU/L (ref 0–40)
Albumin/Globulin Ratio: 1.7 (ref 1.2–2.2)
Albumin: 4.2 g/dL (ref 3.5–4.8)
Alkaline Phosphatase: 53 IU/L (ref 39–117)
BUN/Creatinine Ratio: 13 (ref 12–28)
BUN: 8 mg/dL (ref 8–27)
Bilirubin Total: 0.3 mg/dL (ref 0.0–1.2)
CALCIUM: 9.5 mg/dL (ref 8.7–10.3)
CO2: 25 mmol/L (ref 20–29)
CREATININE: 0.63 mg/dL (ref 0.57–1.00)
Chloride: 101 mmol/L (ref 96–106)
GFR calc Af Amer: 103 mL/min/{1.73_m2} (ref >59)
GFR, EST NON AFRICAN AMERICAN: 89 mL/min/{1.73_m2} (ref >59)
GLOBULIN, TOTAL: 2.5 g/dL (ref 1.5–4.5)
Glucose: 96 mg/dL (ref 65–99)
Potassium: 4.5 mmol/L (ref 3.5–5.2)
Sodium: 141 mmol/L (ref 134–144)
Total Protein: 6.7 g/dL (ref 6.0–8.5)

## 2017-05-13 LAB — CBC WITH DIFFERENTIAL/PLATELET
Basophils Absolute: 0 10*3/uL (ref 0.0–0.2)
Basos: 0 %
EOS (ABSOLUTE): 0.2 10*3/uL (ref 0.0–0.4)
EOS: 3 %
HEMATOCRIT: 41 % (ref 34.0–46.6)
Hemoglobin: 14 g/dL (ref 11.1–15.9)
IMMATURE GRANS (ABS): 0 10*3/uL (ref 0.0–0.1)
IMMATURE GRANULOCYTES: 0 %
LYMPHS: 17 %
Lymphocytes Absolute: 0.9 10*3/uL (ref 0.7–3.1)
MCH: 31.5 pg (ref 26.6–33.0)
MCHC: 34.1 g/dL (ref 31.5–35.7)
MCV: 92 fL (ref 79–97)
MONOCYTES: 11 %
Monocytes Absolute: 0.6 10*3/uL (ref 0.1–0.9)
NEUTROS PCT: 69 %
Neutrophils Absolute: 3.8 10*3/uL (ref 1.4–7.0)
Platelets: 217 10*3/uL (ref 150–379)
RBC: 4.45 x10E6/uL (ref 3.77–5.28)
RDW: 13.7 % (ref 12.3–15.4)
WBC: 5.5 10*3/uL (ref 3.4–10.8)

## 2017-05-13 LAB — VITAMIN D 25 HYDROXY (VIT D DEFICIENCY, FRACTURES): Vit D, 25-Hydroxy: 23.4 ng/mL — ABNORMAL LOW (ref 30.0–100.0)

## 2017-05-13 LAB — HEMOGLOBIN A1C
ESTIMATED AVERAGE GLUCOSE: 111 mg/dL
HEMOGLOBIN A1C: 5.5 % (ref 4.8–5.6)

## 2017-05-13 LAB — LIPID PANEL
CHOL/HDL RATIO: 4.7 ratio — AB (ref 0.0–4.4)
Cholesterol, Total: 281 mg/dL — ABNORMAL HIGH (ref 100–199)
HDL: 60 mg/dL (ref >39)
LDL Calculated: 192 mg/dL — ABNORMAL HIGH (ref 0–99)
Triglycerides: 145 mg/dL (ref 0–149)
VLDL CHOLESTEROL CAL: 29 mg/dL (ref 5–40)

## 2017-05-13 LAB — TSH: TSH: 3.63 u[IU]/mL (ref 0.450–4.500)

## 2017-05-14 ENCOUNTER — Other Ambulatory Visit: Payer: Self-pay | Admitting: Adult Health

## 2017-05-14 DIAGNOSIS — E7849 Other hyperlipidemia: Secondary | ICD-10-CM

## 2017-05-14 DIAGNOSIS — Z79899 Other long term (current) drug therapy: Secondary | ICD-10-CM

## 2017-05-14 DIAGNOSIS — E559 Vitamin D deficiency, unspecified: Secondary | ICD-10-CM

## 2017-05-14 MED ORDER — ATORVASTATIN CALCIUM 40 MG PO TABS: 40.0000 mg | ORAL_TABLET | Freq: Every day | ORAL | 3 refills | Status: DC

## 2017-05-14 MED ORDER — VITAMIN D (ERGOCALCIFEROL) 1.25 MG (50000 UNIT) PO CAPS: 50000.0000 [IU] | ORAL_CAPSULE | ORAL | 0 refills | Status: DC

## 2017-05-28 ENCOUNTER — Telehealth: Payer: Self-pay

## 2017-05-28 NOTE — Telephone Encounter (Signed)
Received Epic notification that pt has not read MyChart message regarding results.     Recent lab results   From Fonnie Mu, CMA To [name] Sent 05/14/2017 5:31 PM  [name] asked that I share that your vitamin D level remains below normal at 23.4. [name] has sent in another round of the prescription strength vitamin D.  Your thyroid test, metabolic panel, complete blood count and A1c (3 month average of blood sugars) were all normal.   Your HDL (good cholesterol) was 60 mg/dL and your total cholesterol was 281 mg/dL (normal is less than 199)  Please re-start the Atorvastatin 40mg  nightly.  Please continue to drink plenty of water and walk your dog [name].  We will recheck your liver function tests in 6 weeks and lipids and vitamin D level in 4 months.   Please call our office to make a lab appointment in 6 weeks for the liver function test and a fasting lab appointment in 4 months.   Wishing you well,  Kenney Houseman, CMA for  [name], NP     [address]   MyChart User Last Read On  Glenda Stevens Not Read     LVM for pt to call to discuss results.  Charyl Bigger, CMA

## 2017-06-02 ENCOUNTER — Other Ambulatory Visit: Payer: Self-pay | Admitting: Adult Health

## 2017-06-11 NOTE — Telephone Encounter (Signed)
LVM for pt to call to discuss results.  T. Nelson, CMA  

## 2017-06-13 NOTE — Telephone Encounter (Signed)
LVM for pt to call to discuss results.  T. Lashuna Tamashiro, CMA  

## 2017-06-16 NOTE — Telephone Encounter (Signed)
Spoke with patient who states that she read the MyChart message and has started the vitamin D.  Pt transferred to front desk to schedule 2 lab appointments.  Charyl Bigger, CMA

## 2017-06-18 ENCOUNTER — Other Ambulatory Visit: Payer: Self-pay | Admitting: Adult Health

## 2017-06-23 ENCOUNTER — Ambulatory Visit (INDEPENDENT_AMBULATORY_CARE_PROVIDER_SITE_OTHER): Payer: Medicare Other | Admitting: Adult Health

## 2017-06-23 ENCOUNTER — Encounter: Payer: Self-pay | Admitting: Adult Health

## 2017-06-23 VITALS — BP 178/92 | HR 76 | Ht 62.0 in | Wt 139.1 lb

## 2017-06-23 DIAGNOSIS — F17200 Nicotine dependence, unspecified, uncomplicated: Secondary | ICD-10-CM | POA: Diagnosis not present

## 2017-06-23 DIAGNOSIS — E7849 Other hyperlipidemia: Secondary | ICD-10-CM | POA: Diagnosis not present

## 2017-06-23 DIAGNOSIS — F419 Anxiety disorder, unspecified: Secondary | ICD-10-CM

## 2017-06-23 DIAGNOSIS — I10 Essential (primary) hypertension: Secondary | ICD-10-CM

## 2017-06-23 MED ORDER — PROPRANOLOL HCL 20 MG PO TABS: 20.0000 mg | ORAL_TABLET | Freq: Three times a day (TID) | ORAL | 0 refills | Status: DC

## 2017-06-23 MED ORDER — NICOTINE 14 MG/24HR TD PT24: 14.0000 mg | MEDICATED_PATCH | Freq: Every day | TRANSDERMAL | 1 refills | Status: DC

## 2017-06-23 MED ORDER — ATORVASTATIN CALCIUM 40 MG PO TABS: 40.0000 mg | ORAL_TABLET | Freq: Every day | ORAL | 3 refills | Status: DC

## 2017-06-23 MED ORDER — AMLODIPINE BESYLATE 2.5 MG PO TABS: 2.5000 mg | ORAL_TABLET | Freq: Every day | ORAL | 0 refills | Status: DC

## 2017-06-23 NOTE — Patient Instructions (Signed)
Hypertension Hypertension, commonly called high blood pressure, is when the force of blood pumping through the arteries is too strong. The arteries are the blood vessels that carry blood from the heart throughout the body. Hypertension forces the heart to work harder to pump blood and may cause arteries to become narrow or stiff. Having untreated or uncontrolled hypertension can cause heart attacks, strokes, kidney disease, and other problems. A blood pressure reading consists of a higher number over a lower number. Ideally, your blood pressure should be below 120/80. The first ("top") number is called the systolic pressure. It is a measure of the pressure in your arteries as your heart beats. The second ("bottom") number is called the diastolic pressure. It is a measure of the pressure in your arteries as the heart relaxes. What are the causes? The cause of this condition is not known. What increases the risk? Some risk factors for high blood pressure are under your control. Others are not. Factors you can change  Smoking.  Having type 2 diabetes mellitus, high cholesterol, or both.  Not getting enough exercise or physical activity.  Being overweight.  Having too much fat, sugar, calories, or salt (sodium) in your diet.  Drinking too much alcohol. Factors that are difficult or impossible to change  Having chronic kidney disease.  Having a family history of high blood pressure.  Age. Risk increases with age.  Race. You may be at higher risk if you are African-American.  Gender. Men are at higher risk than women before age 45. After age 65, women are at higher risk than men.  Having obstructive sleep apnea.  Stress. What are the signs or symptoms? Extremely high blood pressure (hypertensive crisis) may cause:  Headache.  Anxiety.  Shortness of breath.  Nosebleed.  Nausea and vomiting.  Severe chest pain.  Jerky movements you cannot control (seizures).  How is this  diagnosed? This condition is diagnosed by measuring your blood pressure while you are seated, with your arm resting on a surface. The cuff of the blood pressure monitor will be placed directly against the skin of your upper arm at the level of your heart. It should be measured at least twice using the same arm. Certain conditions can cause a difference in blood pressure between your right and left arms. Certain factors can cause blood pressure readings to be lower or higher than normal (elevated) for a short period of time:  When your blood pressure is higher when you are in a health care provider's office than when you are at home, this is called white coat hypertension. Most people with this condition do not need medicines.  When your blood pressure is higher at home than when you are in a health care provider's office, this is called masked hypertension. Most people with this condition may need medicines to control blood pressure.  If you have a high blood pressure reading during one visit or you have normal blood pressure with other risk factors:  You may be asked to return on a different day to have your blood pressure checked again.  You may be asked to monitor your blood pressure at home for 1 week or longer.  If you are diagnosed with hypertension, you may have other blood or imaging tests to help your health care provider understand your overall risk for other conditions. How is this treated? This condition is treated by making healthy lifestyle changes, such as eating healthy foods, exercising more, and reducing your alcohol intake. Your   health care provider may prescribe medicine if lifestyle changes are not enough to get your blood pressure under control, and if:  Your systolic blood pressure is above 130.  Your diastolic blood pressure is above 80.  Your personal target blood pressure may vary depending on your medical conditions, your age, and other factors. Follow these  instructions at home: Eating and drinking  Eat a diet that is high in fiber and potassium, and low in sodium, added sugar, and fat. An example eating plan is called the DASH (Dietary Approaches to Stop Hypertension) diet. To eat this way: ? Eat plenty of fresh fruits and vegetables. Try to fill half of your plate at each meal with fruits and vegetables. ? Eat whole grains, such as whole wheat pasta, brown rice, or whole grain bread. Fill about one quarter of your plate with whole grains. ? Eat or drink low-fat dairy products, such as skim milk or low-fat yogurt. ? Avoid fatty cuts of meat, processed or cured meats, and poultry with skin. Fill about one quarter of your plate with lean proteins, such as fish, chicken without skin, beans, eggs, and tofu. ? Avoid premade and processed foods. These tend to be higher in sodium, added sugar, and fat.  Reduce your daily sodium intake. Most people with hypertension should eat less than 1,500 mg of sodium a day.  Limit alcohol intake to no more than 1 drink a day for nonpregnant women and 2 drinks a day for men. One drink equals 12 oz of beer, 5 oz of wine, or 1 oz of hard liquor. Lifestyle  Work with your health care provider to maintain a healthy body weight or to lose weight. Ask what an ideal weight is for you.  Get at least 30 minutes of exercise that causes your heart to beat faster (aerobic exercise) most days of the week. Activities may include walking, swimming, or biking.  Include exercise to strengthen your muscles (resistance exercise), such as pilates or lifting weights, as part of your weekly exercise routine. Try to do these types of exercises for 30 minutes at least 3 days a week.  Do not use any products that contain nicotine or tobacco, such as cigarettes and e-cigarettes. If you need help quitting, ask your health care provider.  Monitor your blood pressure at home as told by your health care provider.  Keep all follow-up visits as  told by your health care provider. This is important. Medicines  Take over-the-counter and prescription medicines only as told by your health care provider. Follow directions carefully. Blood pressure medicines must be taken as prescribed.  Do not skip doses of blood pressure medicine. Doing this puts you at risk for problems and can make the medicine less effective.  Ask your health care provider about side effects or reactions to medicines that you should watch for. Contact a health care provider if:  You think you are having a reaction to a medicine you are taking.  You have headaches that keep coming back (recurring).  You feel dizzy.  You have swelling in your ankles.  You have trouble with your vision. Get help right away if:  You develop a severe headache or confusion.  You have unusual weakness or numbness.  You feel faint.  You have severe pain in your chest or abdomen.  You vomit repeatedly.  You have trouble breathing. Summary  Hypertension is when the force of blood pumping through your arteries is too strong. If this condition is not   controlled, it may put you at risk for serious complications.  Your personal target blood pressure may vary depending on your medical conditions, your age, and other factors. For most people, a normal blood pressure is less than 120/80.  Hypertension is treated with lifestyle changes, medicines, or a combination of both. Lifestyle changes include weight loss, eating a healthy, low-sodium diet, exercising more, and limiting alcohol. This information is not intended to replace advice given to you by your health care provider. Make sure you discuss any questions you have with your health care provider. Document Released: 02/25/2005 Document Revised: 01/24/2016 Document Reviewed: 01/24/2016 Elsevier Interactive Patient Education  2018 [place] refers to food and lifestyle choices that  are based on the traditions of countries located on the [employer]. This way of eating has been shown to help prevent certain conditions and improve outcomes for people who have chronic diseases, like kidney disease and heart disease. What are tips for following this plan? Lifestyle  Cook and eat meals together with your family, when possible.  Drink enough fluid to keep your urine clear or pale yellow.  Be physically active every day. This includes: ? Aerobic exercise like running or swimming. ? Leisure activities like gardening, walking, or housework.  Get 7-8 hours of sleep each night.  If recommended by your health care provider, drink red wine in moderation. This means 1 glass a day for nonpregnant women and 2 glasses a day for men. A glass of wine equals 5 oz (150 mL). Reading food labels  Check the serving size of packaged foods. For foods such as rice and pasta, the serving size refers to the amount of cooked product, not dry.  Check the total fat in packaged foods. Avoid foods that have saturated fat or trans fats.  Check the ingredients list for added sugars, such as corn syrup. Shopping  At the grocery store, buy most of your food from the areas near the walls of the store. This includes: ? Fresh fruits and vegetables (produce). ? Grains, beans, nuts, and seeds. Some of these may be available in unpackaged forms or large amounts (in bulk). ? Fresh seafood. ? Poultry and eggs. ? Low-fat dairy products.  Buy whole ingredients instead of prepackaged foods.  Buy fresh fruits and vegetables in-season from local farmers markets.  Buy frozen fruits and vegetables in resealable bags.  If you do not have access to quality fresh seafood, buy precooked frozen shrimp or canned fish, such as tuna, salmon, or sardines.  Buy small amounts of raw or cooked vegetables, salads, or olives from the deli or salad bar at your store.  Stock your pantry so you always have certain  foods on hand, such as olive oil, canned tuna, canned tomatoes, rice, pasta, and beans. Cooking  Cook foods with extra-virgin olive oil instead of using butter or other vegetable oils.  Have meat as a side dish, and have vegetables or grains as your main dish. This means having meat in small portions or adding small amounts of meat to foods like pasta or stew.  Use beans or vegetables instead of meat in common dishes like chili or lasagna.  Experiment with different cooking methods. Try roasting or broiling vegetables instead of steaming or sauteing them.  Add frozen vegetables to soups, stews, pasta, or rice.  Add nuts or seeds for added healthy fat at each meal. You can add these to yogurt, salads, or vegetable dishes.  Marinate  fish or vegetables using olive oil, lemon juice, garlic, and fresh herbs. Meal planning  Plan to eat 1 vegetarian meal one day each week. Try to work up to 2 vegetarian meals, if possible.  Eat seafood 2 or more times a week.  Have healthy snacks readily available, such as: ? Vegetable sticks with hummus. ? [place] yogurt. ? Fruit and nut trail mix.  Eat balanced meals throughout the week. This includes: ? Fruit: 2-3 servings a day ? Vegetables: 4-5 servings a day ? Low-fat dairy: 2 servings a day ? Fish, poultry, or lean meat: 1 serving a day ? Beans and legumes: 2 or more servings a week ? Nuts and seeds: 1-2 servings a day ? Whole grains: 6-8 servings a day ? Extra-virgin olive oil: 3-4 servings a day  Limit red meat and sweets to only a few servings a month What are my food choices?  Mediterranean diet ? Recommended ? Grains: Whole-grain pasta. Brown rice. Bulgar wheat. Polenta. Couscous. Whole-wheat bread. [place]. ? Vegetables: Artichokes. Beets. Broccoli. Cabbage. Carrots. Eggplant. Green beans. Chard. Kale. Spinach. Onions. Leeks. Peas. Squash. Tomatoes. Peppers. Radishes. ? Fruits: Apples. Apricots. Avocado. Berries. Bananas.  [place]. Dates. Figs. Grapes. Lemons. Melon. Oranges. Peaches. Plums. Pomegranate. ? Meats and other protein foods: Beans. Almonds. Sunflower seeds. Pine nuts. Peanuts. [place]. Salmon. Scallops. Shrimp. [place]. Tilapia. Clams. Oysters. Eggs. ? Dairy: Low-fat milk. Cheese. Greek yogurt. ? Beverages: Water. Red wine. Herbal tea. ? Fats and oils: Extra virgin olive oil. Avocado oil. Grape seed oil. ? Sweets and desserts: [place] yogurt with honey. Baked apples. Poached pears. Trail mix. ? Seasoning and other foods: Basil. Cilantro. Coriander. Cumin. Mint. Parsley. Sage. Rosemary. Tarragon. Garlic. Oregano. Thyme. Pepper. Balsalmic vinegar. Tahini. Hummus. Tomato sauce. Olives. Mushrooms. ? Limit these ? Grains: Prepackaged pasta or rice dishes. Prepackaged cereal with added sugar. ? Vegetables: Deep fried potatoes (french fries). ? Fruits: Fruit canned in syrup. ? Meats and other protein foods: Beef. Pork. Lamb. Poultry with skin. Hot dogs. [place] Salines. ? Dairy: Ice cream. Sour cream. Whole milk. ? Beverages: Juice. Sugar-sweetened soft drinks. Beer. Liquor and spirits. ? Fats and oils: Butter. Canola oil. Vegetable oil. Beef fat (tallow). Lard. ? Sweets and desserts: Cookies. Cakes. Pies. Candy. ? Seasoning and other foods: Mayonnaise. Premade sauces and marinades. ? The items listed may not be a complete list. Talk with your dietitian about what dietary choices are right for you. Summary  The Mediterranean diet includes both food and lifestyle choices.  Eat a variety of fresh fruits and vegetables, beans, nuts, seeds, and whole grains.  Limit the amount of red meat and sweets that you eat.  Talk with your health care provider about whether it is safe for you to drink red wine in moderation. This means 1 glass a day for nonpregnant women and 2 glasses a day for men. A glass of wine equals 5 oz (150 mL). This information is not intended to replace advice given to you by your health care provider. Make  sure you discuss any questions you have with your health care provider. Document Released: 10/19/2015 Document Revised: 11/21/2015 Document Reviewed: 10/19/2015 Elsevier Interactive Patient Education  2018 [employer].  Following medication changes: Stop Atelonol 50mg  daily. Start Propanolol 20mg , three times daily. Use Nicoderm patch as directed and continue to reduce tobacco use- YOU CAN DO IT! Referral to mental health places re: anxiety. Continue all other medications as directed. Atorvastatin sent in again, please take as directed. Increase daily walking, [name] would  LOVE that! Follow heart healthy diet and continue to check blood pressure and heart rate daily. Please bring log to follow-up in 2 weeks. Please call clinic with any questions/concerns. NICE TO SEE YOU!

## 2017-06-23 NOTE — Assessment & Plan Note (Signed)
Nicoderm patch Rx sent in

## 2017-06-23 NOTE — Progress Notes (Addendum)
Subjective:    Patient ID: Glenda Stevens, female    DOB: 1946-02-25, 72 y.o.   MRN: [id number]  HPI:  [name] presents with  She is currently on atenolol 50mg  QDand Candesaretan 32mg  QD. She reports "feeling flushed in the face" for the last few weeks, which prompted her to check BP She reports home BP readings: SBP 150-170s, DBP 90s She denies CP/increased dyspnea/palpitations. She was re-started on Atorvastatin 40mg  QD on 05/14/17- however she reports not taking med as directed. She has reduced tobacco use "by a few cigarettes daily" and is willing to try Nicoderm patch again. She has been trying to increase regular walking, however is hesitant to walk "Glenda Stevens" in neighborhood after he was attacked by another dog. She denies ETOH use She reports increase in acute anxiety and is open to referral to mental health/therapist.  She she psychiatrist annually and is currently on Sertraline 100mg  QD and Alprazalom 0.25 PRN daily.   She denies thoughts of harming herself/others She has support system of local son and friends.  Patient Care Team    Relationship Specialty Notifications Start End  Glenda Stevens D, NP PCP - General Family Medicine  05/10/97   Glenda Ruff, MD Consulting Physician General Surgery  10/24/14   Glenda Rudd, MD Consulting Physician Radiation Oncology  10/24/14     Patient Active Problem List   Diagnosis Date Noted  . Healthcare maintenance 05/01/2017  . Corneal abrasion, right 10/17/2016  . Vitamin D deficiency 07/18/2016  . Other fatigue 05/09/2016  . Bilateral hip pain 05/09/2016  . Tinea corporis 05/09/2016  . Foot pain, bilateral 05/09/2016  . Medicare annual wellness visit, initial 07/06/2015  . Anxiety 07/06/2015  . Rectal cancer ([place]) 08/01/2014  . Diarrhea 07/08/2014  . Seborrheic keratoses 03/18/2014  . Nasal congestion 09/14/2013  . Smoker 09/14/2013  . Renal artery stenosis ([place]) 08/06/2013  . Hyperlipidemia 08/06/2013  . Essential hypertension  08/06/2013     Past Medical History:  Diagnosis Date  . Allergy    Hay fever  . Anxiety   . Arthritis   . 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 GERD (gastroesophageal reflux disease)   . History of chicken pox   . Hyperlipidemia   . Hypertension   . Phlebitis and thrombophlebitis   . PONV (postoperative nausea and vomiting)   . Renal artery stenosis ([facility])    left stent in 2003  . Renovascular hypertension   . 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 ABDOMINAL HYSTERECTOMY    . BREAST SURGERY     bilateral lumpectomy-benign  . CATARACT EXTRACTION Bilateral   . COLON RESECTION N/A 08/18/2014   Procedure: LAPAROSCOPIC ASSISTED  COLON POLYP RESECTION , DIAGNOSTIC LAPROSCOPY ;  Surgeon: Glenda Ruff, MD;  Location: [facility];  Service: General;  Laterality: N/A;  . NASAL SINUS SURGERY     patient denies having had nasal sinus surgery  . NM MYOCAR PERF WALL MOTION  09/2009   bruce myoview - normal pattern of perfusion, EF 83%, low risk scan  . RENAL ARTERY ANGIOPLASTY  07/02/2001   6x45mm Genesis on Aviator balloon stent overlapping a 6x54mm Genesist on Aviator balloon stent (Dr. Marella Stevens)  . RENAL ARTERY STENT    . RENAL DOPPLER  09/2012   left renal artery stent - 60-99% diameter reduction  . TRANSTHORACIC ECHOCARDIOGRAM  06/2012   EF 55-60%, mild MR  Family History  Problem Relation Age of Onset  . Hypertension Mother   . Heart disease Mother   . Dementia Mother   . Hyperlipidemia Mother   . Heart disease Father   . Hyperlipidemia Father   . Cancer Father        lung cancer and prostate cancer   . Hypertension Father   . Breast cancer Cousin   . Cancer Cousin        4 paternal and 1 maternal cousins had breast cancer   . Healthy Brother   . Colon cancer Neg Hx   . Esophageal cancer Neg Hx   . Stomach cancer Neg Hx   . Rectal cancer Neg Hx      Social History    Substance and Sexual Activity  Drug Use No     Social History   Substance and Sexual Activity  Alcohol Use Yes  . Alcohol/week: 4.2 oz  . Types: 7 Glasses of wine per week     Social History   Tobacco Use  Smoking Status Current Some Day Smoker  . Packs/day: 1.00  . Years: 30.00  . Pack years: 30.00  . Types: Cigarettes  Smokeless Tobacco Never Used     Outpatient Encounter Medications as of 06/23/2017  Medication Sig Note  . ALPRAZolam (XANAX) 0.25 MG tablet Take 0.125 mg by mouth daily as needed for anxiety.   Marland Kitchen aspirin EC 81 MG tablet Take 81 mg by mouth daily.   Marland Kitchen atorvastatin (LIPITOR) 40 MG tablet Take 1 tablet (40 mg total) by mouth daily.   . Cholecalciferol (VITAMIN D PO) Take by mouth daily. 12/13/2016: Patient is not sure the strength  . cholecalciferol (VITAMIN D) 1000 units tablet Take 1,000 Units by mouth daily.   Marland Kitchen ketoconazole (NIZORAL) 2 % shampoo APPLY 1 APPLICATION TOPICALLY 2 (TWO) TIMES A WEEK.   Marland Kitchen loperamide (IMODIUM) 2 MG capsule States taking 1/2 tab daily   . Multiple Vitamin (MULTIVITAMIN) tablet Take 1 tablet by mouth daily.   . Omega-3 Fatty Acids (FISH OIL PO) Take 1 tablet by mouth daily.   . ranitidine (ZANTAC) 150 MG tablet Take 150 mg by mouth daily as needed for heartburn.   . sertraline (ZOLOFT) 100 MG tablet Take 100 mg by mouth daily.    . Vitamin D, Ergocalciferol, (DRISDOL) 50000 units CAPS capsule Take 1 capsule (50,000 Units total) by mouth every 7 (seven) days.   . [DISCONTINUED] atenolol (TENORMIN) 50 MG tablet TAKE 1 TABLET BY MOUTH  DAILY   . [DISCONTINUED] atorvastatin (LIPITOR) 40 MG tablet Take 1 tablet (40 mg total) by mouth daily.   . [DISCONTINUED] candesartan (ATACAND) 32 MG tablet Take 1 tablet (32 mg total) by mouth daily.   Marland Kitchen amLODipine (NORVASC) 2.5 MG tablet Take 1 tablet (2.5 mg total) by mouth daily.   . propranolol (INDERAL) 20 MG tablet Take 1 tablet (20 mg total) by mouth 3 (three) times daily.   .  [DISCONTINUED] nicotine (NICODERM CQ) 14 mg/24hr patch Place 1 patch (14 mg total) onto the skin daily.    Facility-Administered Encounter Medications as of 06/23/2017  Medication  . 0.9 %  sodium chloride infusion    Allergies: Crestor [rosuvastatin]  Body mass index is 25.44 kg/m.  Blood pressure (!) 178/92, pulse 76, height 5\' 2"  (1.575 m), weight 139 lb 1.6 oz (63.1 kg), SpO2 100 %.     Review of Systems  Constitutional: Positive for fatigue. Negative for activity change, appetite change, chills, diaphoresis,  fever and unexpected weight change.  Eyes: Negative for visual disturbance.  Respiratory: Positive for cough. Negative for chest tightness, shortness of breath, wheezing and stridor.   Cardiovascular: Negative for chest pain, palpitations and leg swelling.  Gastrointestinal: Negative for abdominal distention, abdominal pain, blood in stool, constipation, diarrhea, nausea and vomiting.  Genitourinary: Negative for difficulty urinating.  Neurological: Negative for dizziness, tremors, weakness and headaches.  Hematological: Does not bruise/bleed easily.  Psychiatric/Behavioral: Positive for dysphoric mood. Negative for agitation, behavioral problems, confusion, decreased concentration, hallucinations, self-injury, sleep disturbance and suicidal ideas. The patient is nervous/anxious. The patient is not hyperactive.        Objective:   Physical Exam  Constitutional: She is oriented to person, place, and time. She appears well-developed and well-nourished. No distress.  HENT:  Head: Normocephalic and atraumatic.  Right Ear: External ear normal.  Left Ear: External ear normal.  Eyes: Pupils are equal, round, and reactive to light. Conjunctivae are normal.  Cardiovascular: Normal rate, regular rhythm, normal heart sounds and intact distal pulses.  No murmur heard. Pulmonary/Chest: Effort normal and breath sounds normal. No respiratory distress. She has no wheezes. She has no  rales. She exhibits no tenderness.  Neurological: She is alert and oriented to person, place, and time.  Skin: Skin is warm and dry. No rash noted. She is not diaphoretic. No erythema.  Psychiatric: She has a normal mood and affect. Her behavior is normal. Judgment and thought content normal.  Nursing note and vitals reviewed.      Assessment & Plan:   1. Anxiety   2. Essential hypertension   3. Other hyperlipidemia   4. Smoker     Essential hypertension Both BP checks well above goal Following medication changes: Stop Atelonol 50mg  daily. Start Propanolol 20mg , three times daily. Continue Candesartan 32 mg QD Continue Amlodipine 2.5mg  QD Follow heart healthy diet and continue to check blood pressure and heart rate daily. Please bring log to follow-up in 2 weeks. Please call clinic with any questions/concerns.  Anxiety Continue Sertraline 100mg  QD Continue Alprazolam 0.25mg  PRN, reports using 1-2 tabs daily the last few weeks. She denies SE from Alprazolam She is followed by psychiatrist annually Referral to mental health/psycology She denies thoughts of harming herself/others Increase daily walking and practice relaxation techniques   Hyperlipidemia The 10-year ASCVD risk score Mikey Bussing DC Brooke Bonito., et al., 2013) is: 44.9%   Values used to calculate the score:     Age: 27 years     Sex: Female     Is Non-Hispanic African American: No     Diabetic: No     Tobacco smoker: Yes     Systolic Blood Pressure: 295 mmHg     Is BP treated: Yes     HDL Cholesterol: 60 mg/dL     Total Cholesterol: 281 mg/dL  IMPERATIVE that she start Atorvastatin and stop tobacco use Rx sent in again and pt verbalized understanding/agreement She reports mild myalgia with Crestor Encouraged to increase water intake  Smoker Nicoderm patch Rx sent in    FOLLOW-UP:  Return in about 2 weeks (around 07/07/2017) for HTN.

## 2017-06-23 NOTE — Assessment & Plan Note (Addendum)
The 10-year ASCVD risk score Mikey Bussing DC Brooke Bonito., et al., 2013) is: 44.9%   Values used to calculate the score:     Age: 72 years     Sex: Female     Is Non-Hispanic African American: No     Diabetic: No     Tobacco smoker: Yes     Systolic Blood Pressure: 694 mmHg     Is BP treated: Yes     HDL Cholesterol: 60 mg/dL     Total Cholesterol: 281 mg/dL  IMPERATIVE that she start Atorvastatin and stop tobacco use Rx sent in again and pt verbalized understanding/agreement She reports mild myalgia with Crestor Encouraged to increase water intake

## 2017-06-23 NOTE — Assessment & Plan Note (Addendum)
Both BP checks well above goal Following medication changes: Stop Atelonol 50mg  daily. Start Propanolol 20mg , three times daily. Continue Candesartan 32 mg QD Continue Amlodipine 2.5mg  QD Follow heart healthy diet and continue to check blood pressure and heart rate daily. Please bring log to follow-up in 2 weeks. Please call clinic with any questions/concerns.

## 2017-06-23 NOTE — Assessment & Plan Note (Signed)
Continue Sertraline 100mg  QD Continue Alprazolam 0.25mg  PRN, reports using 1-2 tabs daily the last few weeks. She denies SE from Alprazolam She is followed by psychiatrist annually Referral to mental health/psycology She denies thoughts of harming herself/others Increase daily walking and practice relaxation techniques

## 2017-07-06 NOTE — Progress Notes (Signed)
Subjective:    Patient ID: Glenda Stevens, female    DOB: February 15, 1946, 72 y.o.   MRN: [id number]  HPI:  06/23/17 OV: Glenda Stevens presents with  She is currently on atenolol 50mg  QDand Candesaretan 32mg  QD. She reports "feeling flushed in the face" for the last few weeks, which prompted her to check BP She reports home BP readings: SBP 150-170s, DBP 90s She denies CP/increased dyspnea/palpitations. She was re-started on Atorvastatin 40mg  QD on 05/14/17- however she reports not taking med as directed. She has reduced tobacco use "by a few cigarettes daily" and is willing to try Nicoderm patch again. She has been trying to increase regular walking, however is hesitant to walk "Glenda Stevens" in neighborhood after he was attacked by another dog. She denies ETOH use She reports increase in acute anxiety and is open to referral to mental health/therapist.  She she psychiatrist annually and is currently on Sertraline 100mg  QD and Alprazalom 0.25 PRN daily.   She denies thoughts of harming herself/others She has support system of local son and friends.  07/07/17 OV: Glenda Stevens presents for f/u: HTN, anxiety Changes to antihypertensives two weeks ago- Stop Atelonol 50mg  daily. Start Propanolol 20mg , three times daily. Continue Candesartan 32 mg QD Continue Amlodipine 2.5mg  QD She reports home readings- SBP 110-140 DBP 60-80 HR 70-90 Referral to psychology was placed She is taking Sertraline 100mg  QD and Alprazolam 0.25mg  PRN- reports She denies thoughts of harming herself/others She reports that her overall mood has stabalized She was concerned when her SBP was 110, so she stopped taking Candesartan 32mg  She was started on this ARB after renal stent placed, 10-15 years ago  Depression screen [facility] 2/9 07/07/2017 06/23/2017 05/01/2017  Decreased Interest 0 0 0  Down, Depressed, Hopeless 0 0 0  PHQ - 2 Score 0 0 0  Altered sleeping 1 1 -  Tired, decreased energy 1 0 -  Change in appetite 0 0 -  Feeling bad  or failure about yourself  1 1 -  Trouble concentrating 1 1 -  Moving slowly or fidgety/restless 0 0 -  Suicidal thoughts 0 0 -  PHQ-9 Score 4 3 -  Difficult doing work/chores Not difficult at all Somewhat difficult -   Patient Care Team    Relationship Specialty Notifications Start End  [name] D, NP PCP - General Family Medicine  05/15/32   Leighton Ruff, MD Consulting Physician General Surgery  10/24/14   Kyung Rudd, MD Consulting Physician Radiation Oncology  10/24/14     Patient Active Problem List   Diagnosis Date Noted  . Healthcare maintenance 05/01/2017  . Corneal abrasion, right 10/17/2016  . Vitamin D deficiency 07/18/2016  . Other fatigue 05/09/2016  . Bilateral hip pain 05/09/2016  . Tinea corporis 05/09/2016  . Foot pain, bilateral 05/09/2016  . Medicare annual wellness visit, initial 07/06/2015  . Anxiety 07/06/2015  . Rectal cancer ([place]) 08/01/2014  . Diarrhea 07/08/2014  . Seborrheic keratoses 03/18/2014  . Nasal congestion 09/14/2013  . Smoker 09/14/2013  . Renal artery stenosis ([place]) 08/06/2013  . Hyperlipidemia 08/06/2013  . Essential hypertension 08/06/2013     Past Medical History:  Diagnosis Date  . Allergy    Hay fever  . Anxiety   . Arthritis   . 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 GERD (gastroesophageal reflux disease)   . History of chicken pox   . Hyperlipidemia   . Hypertension   . Phlebitis and thrombophlebitis   .  PONV (postoperative nausea and vomiting)   . Renal artery stenosis ([facility])    left stent in 2003  . Renovascular hypertension   . 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 ABDOMINAL HYSTERECTOMY    . BREAST SURGERY     bilateral lumpectomy-benign  . CATARACT EXTRACTION Bilateral   . COLON RESECTION N/A 08/18/2014   Procedure: LAPAROSCOPIC ASSISTED  COLON POLYP RESECTION , DIAGNOSTIC LAPROSCOPY ;  Surgeon: Leighton Ruff, MD;   Location: [facility];  Service: General;  Laterality: N/A;  . NASAL SINUS SURGERY     patient denies having had nasal sinus surgery  . NM MYOCAR PERF WALL MOTION  09/2009   bruce myoview - normal pattern of perfusion, EF 83%, low risk scan  . RENAL ARTERY ANGIOPLASTY  07/02/2001   6x44mm Genesis on Aviator balloon stent overlapping a 6x48mm Genesist on Aviator balloon stent (Dr. Marella Chimes)  . RENAL ARTERY STENT    . RENAL DOPPLER  09/2012   left renal artery stent - 60-99% diameter reduction  . TRANSTHORACIC ECHOCARDIOGRAM  06/2012   EF 55-60%, mild MR     Family History  Problem Relation Age of Onset  . Hypertension Mother   . Heart disease Mother   . Dementia Mother   . Hyperlipidemia Mother   . Heart disease Father   . Hyperlipidemia Father   . Cancer Father        lung cancer and prostate cancer   . Hypertension Father   . Breast cancer Cousin   . Cancer Cousin        4 paternal and 1 maternal cousins had breast cancer   . Healthy Brother   . Colon cancer Neg Hx   . Esophageal cancer Neg Hx   . Stomach cancer Neg Hx   . Rectal cancer Neg Hx      Social History   Substance and Sexual Activity  Drug Use No     Social History   Substance and Sexual Activity  Alcohol Use Yes  . Alcohol/week: 4.2 oz  . Types: 7 Glasses of wine per week     Social History   Tobacco Use  Smoking Status Current Some Day Smoker  . Packs/day: 1.00  . Years: 30.00  . Pack years: 30.00  . Types: Cigarettes  Smokeless Tobacco Never Used     Outpatient Encounter Medications as of 07/07/2017  Medication Sig Note  . ALPRAZolam (XANAX) 0.25 MG tablet Take 0.125 mg by mouth daily as needed for anxiety.   Marland Kitchen amLODipine (NORVASC) 2.5 MG tablet Take 1 tablet (2.5 mg total) by mouth daily.   Marland Kitchen aspirin EC 81 MG tablet Take 81 mg by mouth daily.   Marland Kitchen atorvastatin (LIPITOR) 40 MG tablet Take 1 tablet (40 mg total) by mouth daily.   . Cholecalciferol (VITAMIN D PO) Take by mouth daily.  12/13/2016: Patient is not sure the strength  . cholecalciferol (VITAMIN D) 1000 units tablet Take 1,000 Units by mouth daily.   Marland Kitchen ketoconazole (NIZORAL) 2 % shampoo APPLY 1 APPLICATION TOPICALLY 2 (TWO) TIMES A WEEK.   Marland Kitchen loperamide (IMODIUM) 2 MG capsule States taking 1/2 tab daily   . Multiple Vitamin (MULTIVITAMIN) tablet Take 1 tablet by mouth daily.   . Omega-3 Fatty Acids (FISH OIL PO) Take 1 tablet by mouth daily.   . propranolol (INDERAL) 20 MG tablet Take 1 tablet (20 mg total) by mouth 3 (  three) times daily.   . ranitidine (ZANTAC) 150 MG tablet Take 150 mg by mouth daily as needed for heartburn.   . sertraline (ZOLOFT) 100 MG tablet Take 100 mg by mouth daily.    . Vitamin D, Ergocalciferol, (DRISDOL) 50000 units CAPS capsule Take 1 capsule (50,000 Units total) by mouth every 7 (seven) days.   . candesartan (ATACAND) 8 MG tablet Take 1 tablet (8 mg total) by mouth daily.   . [DISCONTINUED] candesartan (ATACAND) 32 MG tablet Take 1 tablet (32 mg total) by mouth daily.   . [DISCONTINUED] nicotine (NICODERM CQ) 14 mg/24hr patch Place 1 patch (14 mg total) onto the skin daily.    Facility-Administered Encounter Medications as of 07/07/2017  Medication  . 0.9 %  sodium chloride infusion    Allergies: Crestor [rosuvastatin]  Body mass index is 26.32 kg/m.  Blood pressure (!) 142/80, pulse 74, height 5\' 2"  (1.575 m), weight 143 lb 14.4 oz (65.3 kg), SpO2 98 %.     Review of Systems  Constitutional: Positive for fatigue. Negative for activity change, appetite change, chills, diaphoresis, fever and unexpected weight change.  Eyes: Negative for visual disturbance.  Respiratory: Negative for cough, chest tightness, shortness of breath, wheezing and stridor.   Cardiovascular: Negative for chest pain, palpitations and leg swelling.  Gastrointestinal: Negative for abdominal distention, abdominal pain, blood in stool, constipation, diarrhea, nausea and vomiting.  Genitourinary: Negative  for difficulty urinating.  Neurological: Negative for dizziness, tremors, weakness and headaches.  Hematological: Does not bruise/bleed easily.  Psychiatric/Behavioral: Negative for agitation, behavioral problems, confusion, decreased concentration, dysphoric mood, hallucinations, self-injury, sleep disturbance and suicidal ideas. The patient is not nervous/anxious and is not hyperactive.        Objective:   Physical Exam  Constitutional: She is oriented to person, place, and time. She appears well-developed and well-nourished. No distress.  HENT:  Head: Normocephalic and atraumatic.  Right Ear: External ear normal.  Left Ear: External ear normal.  Eyes: Pupils are equal, round, and reactive to light. Conjunctivae are normal.  Cardiovascular: Normal rate, regular rhythm, normal heart sounds and intact distal pulses.  No murmur heard. Pulmonary/Chest: Effort normal and breath sounds normal. No respiratory distress. She has no wheezes. She has no rales. She exhibits no tenderness.  Neurological: She is alert and oriented to person, place, and time.  Skin: Skin is warm and dry. No rash noted. She is not diaphoretic. No erythema. No pallor.  Psychiatric: She has a normal mood and affect. Her behavior is normal. Judgment and thought content normal.  Nursing note and vitals reviewed.     Assessment & Plan:   1. Essential hypertension   2. Healthcare maintenance   3. Anxiety     Healthcare maintenance Continue all medications as directed. One change today- remain off Candesartan 32mg  and start Candesartan 8mg  once daily. Continue to check blood pressure and heart rate several times/week. If readings are consistently >140/90 or <100/60- please call clinic. Continue to stay hydrated and eat a heart healthy diet. Once Glenda Stevens feels better, get back to regular walking. Follow-up in 6 months, sooner if needed.  Essential hypertension She reports home readings- SBP 110-140 DBP 60-80 HR  70-90 She was concerned when her SBP was 110, so she stopped taking Candesartan 32mg  She was started on this ARB after renal stent placed, 10-15 years ago Re-started on Candesartan, but dosage reduced to 8mg  QD Continue to check blood pressure and heart rate several times/week. If readings are consistently >140/90 or <  100/60- please call clinic. Continue to stay hydrated and eat a heart healthy diet.  Anxiety Currently taking Sertraline 100mg  QD and has been using Propranolol 20mg  TID the last two weeks. She reports sig reduction in anxiety/tension. She denies thoughts of harming herself/others She plans on establishing with therapist. Pt's demeanor is much improved from last OV two weeks ago    FOLLOW-UP:  Return in about 6 months (around 01/06/2018) for Regular Follow Up.

## 2017-07-07 ENCOUNTER — Encounter: Payer: Self-pay | Admitting: Adult Health

## 2017-07-07 ENCOUNTER — Ambulatory Visit (INDEPENDENT_AMBULATORY_CARE_PROVIDER_SITE_OTHER): Payer: Medicare Other | Admitting: Adult Health

## 2017-07-07 VITALS — BP 142/80 | HR 74 | Ht 62.0 in | Wt 143.9 lb

## 2017-07-07 DIAGNOSIS — Z0000 Encounter for general adult medical examination without abnormal findings: Secondary | ICD-10-CM

## 2017-07-07 DIAGNOSIS — I10 Essential (primary) hypertension: Secondary | ICD-10-CM | POA: Diagnosis not present

## 2017-07-07 DIAGNOSIS — F419 Anxiety disorder, unspecified: Secondary | ICD-10-CM | POA: Diagnosis not present

## 2017-07-07 MED ORDER — CANDESARTAN CILEXETIL 8 MG PO TABS: 8.0000 mg | ORAL_TABLET | Freq: Every day | ORAL | 2 refills | Status: DC

## 2017-07-07 NOTE — Assessment & Plan Note (Addendum)
Currently taking Sertraline 100mg  QD and has been using Propranolol 20mg  TID the last two weeks. She reports sig reduction in anxiety/tension. She denies thoughts of harming herself/others She plans on establishing with therapist. Pt's demeanor is much improved from last OV two weeks ago

## 2017-07-07 NOTE — Assessment & Plan Note (Signed)
Continue all medications as directed. One change today- remain off Candesartan 32mg  and start Candesartan 8mg  once daily. Continue to check blood pressure and heart rate several times/week. If readings are consistently >140/90 or <100/60- please call clinic. Continue to stay hydrated and eat a heart healthy diet. Once Glenda Stevens feels better, get back to regular walking. Follow-up in 6 months, sooner if needed.

## 2017-07-07 NOTE — Patient Instructions (Signed)
Generalized Anxiety Disorder, Adult Generalized anxiety disorder (GAD) is a mental health disorder. People with this condition constantly worry about everyday events. Unlike normal anxiety, worry related to GAD is not triggered by a specific event. These worries also do not fade or get better with time. GAD interferes with life functions, including relationships, work, and school. GAD can vary from mild to severe. People with severe GAD can have intense waves of anxiety with physical symptoms (panic attacks). What are the causes? The exact cause of GAD is not known. What increases the risk? This condition is more likely to develop in:  Women.  People who have a family history of anxiety disorders.  People who are very shy.  People who experience very stressful life events, such as the death of a loved one.  People who have a very stressful family environment.  What are the signs or symptoms? People with GAD often worry excessively about many things in their lives, such as their health and family. They may also be overly concerned about:  Doing well at work.  Being on time.  Natural disasters.  Friendships.  Physical symptoms of GAD include:  Fatigue.  Muscle tension or having muscle twitches.  Trembling or feeling shaky.  Being easily startled.  Feeling like your heart is pounding or racing.  Feeling out of breath or like you cannot take a deep breath.  Having trouble falling asleep or staying asleep.  Sweating.  Nausea, diarrhea, or irritable bowel syndrome (IBS).  Headaches.  Trouble concentrating or remembering facts.  Restlessness.  Irritability.  How is this diagnosed? Your health care provider can diagnose GAD based on your symptoms and medical history. You will also have a physical exam. The health care provider will ask specific questions about your symptoms, including how severe they are, when they started, and if they come and go. Your health care  provider may ask you about your use of alcohol or drugs, including prescription medicines. Your health care provider may refer you to a mental health specialist for further evaluation. Your health care provider will do a thorough examination and may perform additional tests to rule out other possible causes of your symptoms. To be diagnosed with GAD, a person must have anxiety that:  Is out of his or her control.  Affects several different aspects of his or her life, such as work and relationships.  Causes distress that makes him or her unable to take part in normal activities.  Includes at least three physical symptoms of GAD, such as restlessness, fatigue, trouble concentrating, irritability, muscle tension, or sleep problems.  Before your health care provider can confirm a diagnosis of GAD, these symptoms must be present more days than they are not, and they must last for six months or longer. How is this treated? The following therapies are usually used to treat GAD:  Medicine. Antidepressant medicine is usually prescribed for long-term daily control. Antianxiety medicines may be added in severe cases, especially when panic attacks occur.  Talk therapy (psychotherapy). Certain types of talk therapy can be helpful in treating GAD by providing support, education, and guidance. Options include: ? Cognitive behavioral therapy (CBT). People learn coping skills and techniques to ease their anxiety. They learn to identify unrealistic or negative thoughts and behaviors and to replace them with positive ones. ? Acceptance and commitment therapy (ACT). This treatment teaches people how to be mindful as a way to cope with unwanted thoughts and feelings. ? Biofeedback. This process trains you to   manage your body's response (physiological response) through breathing techniques and relaxation methods. You will work with a therapist while machines are used to monitor your physical symptoms.  Stress  management techniques. These include yoga, meditation, and exercise.  A mental health specialist can help determine which treatment is best for you. Some people see improvement with one type of therapy. However, other people require a combination of therapies. Follow these instructions at home:  Take over-the-counter and prescription medicines only as told by your health care provider.  Try to maintain a normal routine.  Try to anticipate stressful situations and allow extra time to manage them.  Practice any stress management or self-calming techniques as taught by your health care provider.  Do not punish yourself for setbacks or for not making progress.  Try to recognize your accomplishments, even if they are small.  Keep all follow-up visits as told by your health care provider. This is important. Contact a health care provider if:  Your symptoms do not get better.  Your symptoms get worse.  You have signs of depression, such as: ? A persistently sad, cranky, or irritable mood. ? Loss of enjoyment in activities that used to bring you joy. ? Change in weight or eating. ? Changes in sleeping habits. ? Avoiding friends or family members. ? Loss of energy for normal tasks. ? Feelings of guilt or worthlessness. Get help right away if:  You have serious thoughts about hurting yourself or others. If you ever feel like you may hurt yourself or others, or have thoughts about taking your own life, get help right away. You can go to your nearest emergency department or call:  Your local emergency services (911 in the [place]  A suicide crisis helpline, such as the [place] at (929)473-5874. This is open 24 hours a day.  Summary  Generalized anxiety disorder (GAD) is a mental health disorder that involves worry that is not triggered by a specific event.  People with GAD often worry excessively about many things in their lives, such as their health and  family.  GAD may cause physical symptoms such as restlessness, trouble concentrating, sleep problems, frequent sweating, nausea, diarrhea, headaches, and trembling or muscle twitching.  A mental health specialist can help determine which treatment is best for you. Some people see improvement with one type of therapy. However, other people require a combination of therapies. This information is not intended to replace advice given to you by your health care provider. Make sure you discuss any questions you have with your health care provider. Document Released: 06/22/2012 Document Revised: 01/16/2016 Document Reviewed: 01/16/2016 Elsevier Interactive Patient Education  2018 [employer].    Hypertension Hypertension, commonly called high blood pressure, is when the force of blood pumping through the arteries is too strong. The arteries are the blood vessels that carry blood from the heart throughout the body. Hypertension forces the heart to work harder to pump blood and may cause arteries to become narrow or stiff. Having untreated or uncontrolled hypertension can cause heart attacks, strokes, kidney disease, and other problems. A blood pressure reading consists of a higher number over a lower number. Ideally, your blood pressure should be below 120/80. The first ("top") number is called the systolic pressure. It is a measure of the pressure in your arteries as your heart beats. The second ("bottom") number is called the diastolic pressure. It is a measure of the pressure in your arteries as the heart relaxes. What are the  causes? The cause of this condition is not known. What increases the risk? Some risk factors for high blood pressure are under your control. Others are not. Factors you can change  Smoking.  Having type 2 diabetes mellitus, high cholesterol, or both.  Not getting enough exercise or physical activity.  Being overweight.  Having too much fat, sugar, calories, or salt  (sodium) in your diet.  Drinking too much alcohol. Factors that are difficult or impossible to change  Having chronic kidney disease.  Having a family history of high blood pressure.  Age. Risk increases with age.  Race. You may be at higher risk if you are African-American.  Gender. Men are at higher risk than women before age 49. After age 58, women are at higher risk than men.  Having obstructive sleep apnea.  Stress. What are the signs or symptoms? Extremely high blood pressure (hypertensive crisis) may cause:  Headache.  Anxiety.  Shortness of breath.  Nosebleed.  Nausea and vomiting.  Severe chest pain.  Jerky movements you cannot control (seizures).  How is this diagnosed? This condition is diagnosed by measuring your blood pressure while you are seated, with your arm resting on a surface. The cuff of the blood pressure monitor will be placed directly against the skin of your upper arm at the level of your heart. It should be measured at least twice using the same arm. Certain conditions can cause a difference in blood pressure between your right and left arms. Certain factors can cause blood pressure readings to be lower or higher than normal (elevated) for a short period of time:  When your blood pressure is higher when you are in a health care provider's office than when you are at home, this is called white coat hypertension. Most people with this condition do not need medicines.  When your blood pressure is higher at home than when you are in a health care provider's office, this is called masked hypertension. Most people with this condition may need medicines to control blood pressure.  If you have a high blood pressure reading during one visit or you have normal blood pressure with other risk factors:  You may be asked to return on a different day to have your blood pressure checked again.  You may be asked to monitor your blood pressure at home for 1 week  or longer.  If you are diagnosed with hypertension, you may have other blood or imaging tests to help your health care provider understand your overall risk for other conditions. How is this treated? This condition is treated by making healthy lifestyle changes, such as eating healthy foods, exercising more, and reducing your alcohol intake. Your health care provider may prescribe medicine if lifestyle changes are not enough to get your blood pressure under control, and if:  Your systolic blood pressure is above 130.  Your diastolic blood pressure is above 80.  Your personal target blood pressure may vary depending on your medical conditions, your age, and other factors. Follow these instructions at home: Eating and drinking  Eat a diet that is high in fiber and potassium, and low in sodium, added sugar, and fat. An example eating plan is called the DASH (Dietary Approaches to Stop Hypertension) diet. To eat this way: ? Eat plenty of fresh fruits and vegetables. Try to fill half of your plate at each meal with fruits and vegetables. ? Eat whole grains, such as whole wheat pasta, brown rice, or whole grain bread. Fill  about one quarter of your plate with whole grains. ? Eat or drink low-fat dairy products, such as skim milk or low-fat yogurt. ? Avoid fatty cuts of meat, processed or cured meats, and poultry with skin. Fill about one quarter of your plate with lean proteins, such as fish, chicken without skin, beans, eggs, and tofu. ? Avoid premade and processed foods. These tend to be higher in sodium, added sugar, and fat.  Reduce your daily sodium intake. Most people with hypertension should eat less than 1,500 mg of sodium a day.  Limit alcohol intake to no more than 1 drink a day for nonpregnant women and 2 drinks a day for men. One drink equals 12 oz of beer, 5 oz of wine, or 1 oz of hard liquor. Lifestyle  Work with your health care provider to maintain a healthy body weight or to  lose weight. Ask what an ideal weight is for you.  Get at least 30 minutes of exercise that causes your heart to beat faster (aerobic exercise) most days of the week. Activities may include walking, swimming, or biking.  Include exercise to strengthen your muscles (resistance exercise), such as pilates or lifting weights, as part of your weekly exercise routine. Try to do these types of exercises for 30 minutes at least 3 days a week.  Do not use any products that contain nicotine or tobacco, such as cigarettes and e-cigarettes. If you need help quitting, ask your health care provider.  Monitor your blood pressure at home as told by your health care provider.  Keep all follow-up visits as told by your health care provider. This is important. Medicines  Take over-the-counter and prescription medicines only as told by your health care provider. Follow directions carefully. Blood pressure medicines must be taken as prescribed.  Do not skip doses of blood pressure medicine. Doing this puts you at risk for problems and can make the medicine less effective.  Ask your health care provider about side effects or reactions to medicines that you should watch for. Contact a health care provider if:  You think you are having a reaction to a medicine you are taking.  You have headaches that keep coming back (recurring).  You feel dizzy.  You have swelling in your ankles.  You have trouble with your vision. Get help right away if:  You develop a severe headache or confusion.  You have unusual weakness or numbness.  You feel faint.  You have severe pain in your chest or abdomen.  You vomit repeatedly.  You have trouble breathing. Summary  Hypertension is when the force of blood pumping through your arteries is too strong. If this condition is not controlled, it may put you at risk for serious complications.  Your personal target blood pressure may vary depending on your medical  conditions, your age, and other factors. For most people, a normal blood pressure is less than 120/80.  Hypertension is treated with lifestyle changes, medicines, or a combination of both. Lifestyle changes include weight loss, eating a healthy, low-sodium diet, exercising more, and limiting alcohol. This information is not intended to replace advice given to you by your health care provider. Make sure you discuss any questions you have with your health care provider. Document Released: 02/25/2005 Document Revised: 01/24/2016 Document Reviewed: 01/24/2016 Elsevier Interactive Patient Education  2018 [place] all medications as directed. One change today- remain off Candesartan 32mg  and start Candesartan 8mg  once daily. Continue to check blood pressure and  heart rate several times/week. If readings are consistently >140/90 or <100/60- please call clinic. Continue to stay hydrated and eat a heart healthy diet. Once Glenda Stevens feels better, get back to regular walking. Follow-up in 6 months, sooner if needed. NICE TO SEE YOU!

## 2017-07-07 NOTE — Assessment & Plan Note (Signed)
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 If readings are consistently >140/90 or <100/60- please call clinic. Continue to stay hydrated and eat a heart healthy diet.

## 2017-07-13 ENCOUNTER — Other Ambulatory Visit: Payer: Self-pay | Admitting: Adult Health

## 2017-07-14 MED ORDER — AMLODIPINE BESYLATE 2.5 MG PO TABS: 2.5000 mg | ORAL_TABLET | Freq: Every day | ORAL | 0 refills | Status: DC

## 2017-07-14 MED ORDER — PROPRANOLOL HCL 20 MG PO TABS: 20.0000 mg | ORAL_TABLET | Freq: Three times a day (TID) | ORAL | 0 refills | Status: DC

## 2017-07-15 ENCOUNTER — Other Ambulatory Visit: Payer: Self-pay | Admitting: Adult Health

## 2017-07-28 ENCOUNTER — Other Ambulatory Visit: Payer: Medicare Other

## 2017-07-28 DIAGNOSIS — E7849 Other hyperlipidemia: Secondary | ICD-10-CM

## 2017-07-28 DIAGNOSIS — C20 Malignant neoplasm of rectum: Secondary | ICD-10-CM

## 2017-07-28 DIAGNOSIS — E559 Vitamin D deficiency, unspecified: Secondary | ICD-10-CM

## 2017-07-28 DIAGNOSIS — Z79899 Other long term (current) drug therapy: Secondary | ICD-10-CM

## 2017-07-29 LAB — ALT: ALT: 23 IU/L (ref 0–32)

## 2017-07-30 ENCOUNTER — Other Ambulatory Visit: Payer: Self-pay | Admitting: Adult Health

## 2017-09-01 ENCOUNTER — Other Ambulatory Visit: Payer: Self-pay | Admitting: Adult Health

## 2017-09-01 ENCOUNTER — Telehealth: Payer: Self-pay

## 2017-09-01 NOTE — Progress Notes (Signed)
Atenolol d/c'd 06/23/17 Only BB that she should be taking Propanolol 20mg  TID Spoke with [facility]  At 1724 and ensured that Atenolol is to be d/c'd and NOT to be filled/delivered Pt called/left VM to ensure that she is only taking propanolol and NOT atenolol

## 2017-09-01 NOTE — Telephone Encounter (Signed)
Received fax from [facility] that pt is getting both atenolol and propranolol filled at the pharmacy.  LVM for pt to call to discuss duplicate therapy and ensure that she is not taking atenolol and only the propranolol.  Charyl Bigger, CMA

## 2017-09-02 ENCOUNTER — Telehealth: Payer: Self-pay

## 2017-09-02 NOTE — Telephone Encounter (Signed)
LVM for pt to call to discuss on cell phone.  Charyl Bigger, CMA

## 2017-09-02 NOTE — Telephone Encounter (Signed)
Spoke with pt who states that she is NOT taking atenolol and is taking only propranolol.  Advised pt this is correct and to continue with propranolol only.  Charyl Bigger, CMA

## 2017-09-02 NOTE — Telephone Encounter (Signed)
Spoke with [facility]  At 6144 on 6/24/- Ensured that Atenolol is to be d/c'd and NOT to be filled/delivered-Atenolol d/c'd 06/23/17 per our records Only BB that she should be taking Propanolol 20mg  TID [facility] explained that Atenolol was filled per "auto-refill"  Tonya- can you please confirm with Ms. Muradyan that the ONLY BB that she is to taking propanolol and she is NOT taking Atenolol  Thanks! [name]

## 2017-09-02 NOTE — Telephone Encounter (Signed)
Please document your conversation with [facility].  Charyl Bigger, CMA

## 2017-09-08 ENCOUNTER — Other Ambulatory Visit: Payer: Self-pay | Admitting: Adult Health

## 2017-09-09 NOTE — Progress Notes (Signed)
[place]  [phone]) [phone] [phone]) [phone]  Clinic follow up Note   Patient Care Team: Esaw Grandchild, NP as PCP - General (Family Medicine) Leighton Ruff, MD as Consulting Physician (General Surgery) Kyung Rudd, MD as Consulting Physician (Radiation Oncology)   Date of Service:  09/10/2017  CHIEF COMPLAINTS:  Follow up rectal squamous cell carcinoma  Oncology History   Presented with heme + stool, loose stools for 2 months     Rectal cancer ([facility])   08/01/2014 Pathology Results    Rectum, biopsy, distal mass - SQUAMOUS CELL CARCINOMA, BASALOID TYPE      08/01/2014 Procedure    Colonoscopy-ulcerated, malignant appearing distal rectal mass adjacent to the internal anal sphincter. #14 polyps spread throughout the colon (#12 removed)      08/01/2014 Tumor Marker    CEA=5.3      08/01/2014 Initial Diagnosis    Rectal cancer      08/04/2014 Imaging    CT C/A/P-No evidence of metastatic disease in chest, abdomen, or pelvis      08/18/2014 Procedure    Colonoscopy and parotidectomy. Path reviewed tubular adenoma.      08/25/2014 Imaging    PET IMPRESSION: 1. Hypermetabolic activity at the anus corresponding with known squamous cell carcinoma. No evidence of metastatic disease. 2. No other suspicious metabolic activity. 3. Diffuse atherosclerosis.      09/05/2014 - 10/19/2014 Radiation Therapy    definitive XRT to 50.4 Gy with concurrent chemotherapy      09/05/2015 - 10/07/2015 Chemotherapy    Mitomycin 10mg /m2 on Day 1 and 28, 5-fu 1000mg /m2 on day 1-5  and day 28-32 (96 hours)      11/10/2015 Procedure    Colonoscopy 11/10/2015 - Three 4 to 5 mm polyps in the ascending colon, removed with a hot snare. Resected and retrieved. - One 4 mm polyp in the transverse colon, removed with a cold snare. Resected and retrieved. - The examination was otherwise normal on direct and retroflexion views.       11/10/2015 Pathology Results    Diagnosis  11/10/2015 Surgical [P], ascendingx3, transversex1, polyp (4) - TUBULAR ADENOMA (X3 FRAGMENTS). - BENIGN COLONIC MUCOSA (MULTIPLE FRAGMENTS). - NO HIGH GRADE DYSPLASIA OR MALIGNANCY.      03/06/2016 PET scan    PET 03/06/2016 IMPRESSION: Negative PET scan for residual, recurrent or metastatic disease.      03/07/2017 PET scan    PET 03/07/17 IMPRESSION: No evidence of colorectal carcinoma local recurrence or metastasis. Stable exam.       HISTORY OF PRESENTING ILLNESS:  Glenda Stevens 72 y.o. female is here because of recently diagnosed rectal squamous cell carcinoma.  She has been having diarrhea for the past 4 months, she has watery bowel movement 1-2 times daily, with some urgency, no blood, associated with abdominal cramps before the bowel movement. She denies any nausea or vomiting. She has good appetite, no weight loss. No fever or chills. She didn't seek medical attention until a month's ago she was in [place] by her primary care physician. Lab CBC and CMP was unremarkable, stool guaiac was positive. She was referred to gastroenterologist Dr. Ardis Hughs, and underwent her first colonoscopy on 08/01/2014. The colonoscopy showed a total of 14 polyps, 12 polyps were removed, and a distal rectal mass adjacent to the internal and no sphincter was found, which was suspicious for malignancy. Biopsy showed squamous cell carcinoma. Staging scan CT showed no evidence of adenopathy or distant metastasis. She was referred to our cancer  center to discuss chemotherapy and radiation. She is going to see radiation oncologist Dr. Lisbeth Renshaw this afternoon.  CURRENT THERAPY: Surveillance    INTERIM HISTORY: Glenda Stevens is a 72 y.o. female who returns for follow-up. She is here for follow-up. She is here alone. She feels well today. No pain. Her bowel movements are good and improving. She states that she has bouts of diarrhea occasionally. She sometimes gets bloated.  She is very happy to feel better.  She  follows-up with her PCP regularly. Her BP is high today at 178/70, which she relates to the hospital visit, as her BP is normal at home.     MEDICAL HISTORY:  Past Medical History:  Diagnosis Date  . Allergy    Hay fever  . Anxiety   . Arthritis   . Colon cancer ([facility]) 08/01/14   rectal squamous cell ca  . GERD (gastroesophageal reflux disease)   . History of chicken pox   . Hyperlipidemia   . Hypertension   . Phlebitis and thrombophlebitis   . PONV (postoperative nausea and vomiting)   . Renal artery stenosis ([facility])    left stent in 2003  . Renovascular hypertension   . S/P radiation therapy 09/05/14-10/19/14   anal ca,50.4Gy    SURGICAL HISTORY: Past Surgical History:  Procedure Laterality Date  . ABDOMINAL HYSTERECTOMY    . BREAST SURGERY     bilateral lumpectomy-benign  . CATARACT EXTRACTION Bilateral   . COLON RESECTION N/A 08/18/2014   Procedure: LAPAROSCOPIC ASSISTED  COLON POLYP RESECTION , DIAGNOSTIC LAPROSCOPY ;  Surgeon: Leighton Ruff, MD;  Location: [facility];  Service: General;  Laterality: N/A;  . NASAL SINUS SURGERY     patient denies having had nasal sinus surgery  . NM MYOCAR PERF WALL MOTION  09/2009   bruce myoview - normal pattern of perfusion, EF 83%, low risk scan  . RENAL ARTERY ANGIOPLASTY  07/02/2001   6x54mm Genesis on Aviator balloon stent overlapping a 6x23mm Genesist on Aviator balloon stent (Dr. Marella Chimes)  . RENAL ARTERY STENT    . RENAL DOPPLER  09/2012   left renal artery stent - 60-99% diameter reduction  . TRANSTHORACIC ECHOCARDIOGRAM  06/2012   EF 55-60%, mild MR    SOCIAL HISTORY: Social History   Socioeconomic History  . Marital status: Divorced    Spouse name: Not on file  . Number of children: 1  . Years of education: 52  . Highest education level: Not on file  Occupational History  . Occupation: Education officer, museum  Social Needs  . Financial resource strain: Not on file  . Food insecurity:    Worry: Not on file    Inability: Not  on file  . Transportation needs:    Medical: Not on file    Non-medical: Not on file  Tobacco Use  . Smoking status: Current Some Day Smoker    Packs/day: 1.00    Years: 30.00    Pack years: 30.00    Types: Cigarettes  . Smokeless tobacco: Never Used  Substance and Sexual Activity  . Alcohol use: Yes    Alcohol/week: 4.2 oz    Types: 7 Glasses of wine per week  . Drug use: No  . Sexual activity: Not Currently  Lifestyle  . Physical activity:    Days per week: Not on file    Minutes per session: Not on file  . Stress: Not on file  Relationships  . Social connections:    Talks on phone: Not  on file    Gets together: Not on file    Attends religious service: Not on file    Active member of club or organization: Not on file    Attends meetings of clubs or organizations: Not on file    Relationship status: Not on file  . Intimate partner violence:    Fear of current or ex partner: Not on file    Emotionally abused: Not on file    Physically abused: Not on file    Forced sexual activity: Not on file  Other Topics Concern  . Not on file  Social History Narrative   Divorced   1 son, local who lives with her   Retired from [employer] work   Hydrologist grad   Has poodle named "Murphy"    FAMILY HISTORY: Family History  Problem Relation Age of Onset  . Hypertension Mother   . Heart disease Mother   . Dementia Mother   . Hyperlipidemia Mother   . Heart disease Father   . Hyperlipidemia Father   . Cancer Father        lung cancer and prostate cancer   . Hypertension Father   . Breast cancer Cousin   . Cancer Cousin        4 paternal and 1 maternal cousins had breast cancer   . Healthy Brother   . Colon cancer Neg Hx   . Esophageal cancer Neg Hx   . Stomach cancer Neg Hx   . Rectal cancer Neg Hx     ALLERGIES:  has No Known Allergies.  MEDICATIONS:  Current Outpatient Medications  Medication Sig Dispense Refill  . ALPRAZolam (XANAX) 0.25 MG tablet Take 0.125 mg by  mouth daily as needed for anxiety.    Marland Kitchen amLODipine (NORVASC) 2.5 MG tablet Take 1 tablet (2.5 mg total) by mouth daily. 90 tablet 0  . aspirin EC 81 MG tablet Take 81 mg by mouth daily.    Marland Kitchen atorvastatin (LIPITOR) 40 MG tablet Take 1 tablet (40 mg total) by mouth daily. 90 tablet 3  . Cholecalciferol (VITAMIN D PO) Take by mouth daily.    . cholecalciferol (VITAMIN D) 1000 units tablet Take 1,000 Units by mouth daily.    Marland Kitchen ketoconazole (NIZORAL) 2 % shampoo APPLY 1 APPLICATION TOPICALLY 2 (TWO) TIMES A WEEK. 120 mL 1  . loperamide (IMODIUM) 2 MG capsule States taking 1/2 tab daily    . Multiple Vitamin (MULTIVITAMIN) tablet Take 1 tablet by mouth daily.    . Omega-3 Fatty Acids (FISH OIL PO) Take 1 tablet by mouth daily.    . propranolol (INDERAL) 20 MG tablet TAKE 1 TABLET BY MOUTH 3  TIMES DAILY 270 tablet 1  . ranitidine (ZANTAC) 150 MG tablet Take 150 mg by mouth daily as needed for heartburn.    . sertraline (ZOLOFT) 100 MG tablet Take 100 mg by mouth daily.      Current Facility-Administered Medications  Medication Dose Route Frequency Provider Last Rate Last Dose  . 0.9 %  sodium chloride infusion  500 mL Intravenous Continuous Milus Banister, MD        REVIEW OF SYSTEMS:   Constitutional: Denies fevers, chills or abnormal night sweats Eyes: Denies blurriness of vision, double vision or watery eyes Ears, nose, mouth, throat, and face: Denies mucositis or sore throat Respiratory: Denies cough, dyspnea or wheezes Cardiovascular: Denies palpitation, chest discomfort or lower extremity swelling Gastrointestinal:  Denies nausea, heartburn. (+) occational diarrhea and bloating  Skin: Denies abnormal skin rashes Lymphatics: Denies new lymphadenopathy or easy bruising Neurological:Denies numbness, tingling or new weaknesses Behavioral/Psych: Mood is stable, no new changes  All other systems were reviewed with the patient and are negative.  PHYSICAL EXAMINATION:  ECOG PERFORMANCE  STATUS: 0  Vitals:   09/10/17 1304  BP: (!) 178/70  Pulse: 68  Resp: 20  Temp: 98.6 F (37 C)  SpO2: 100%   Filed Weights   09/10/17 1304  Weight: 141 lb 6.4 oz (64.1 kg)    GENERAL:alert, no distress and comfortable SKIN: skin color, texture, turgor are normal, no rashes or significant lesions EYES: normal, conjunctiva are pink and non-injected, sclera clear OROPHARYNX:no exudate, no erythema and lips, buccal mucosa, and tongue normal  NECK: supple, thyroid normal size, non-tender, without nodularity LYMPH:  no palpable lymphadenopathy in the cervical, axillary or inguinal area. LUNGS: clear to auscultation and percussion with normal breathing effort HEART: regular rate & rhythm and no murmurs and no lower extremity edema ABDOMEN:abdomen soft, non-tender and normal bowel sounds. Rectal exam was deferred today. PSYCH: alert & oriented x 3 with fluent speech NEURO: no focal motor/sensory deficits RECTAL: no tenderness, no palpable mass, no blood on the glove.  LABORATORY DATA:  CBC Latest Ref Rng & Units 09/10/2017 05/12/2017 03/14/2017  WBC 3.9 - 10.3 K/uL 6.1 5.5 6.3  Hemoglobin 11.6 - 15.9 g/dL 13.3 14.0 13.6  Hematocrit 34.8 - 46.6 % 39.7 41.0 40.3  Platelets 145 - 400 K/uL 245 217 211    CMP Latest Ref Rng & Units 09/10/2017 07/28/2017 05/12/2017  Glucose 70 - 99 mg/dL 87 - 96  BUN 8 - 23 mg/dL 12 - 8  Creatinine 0.44 - 1.00 mg/dL 0.76 - 0.63  Sodium 135 - 145 mmol/L 136 - 141  Potassium 3.5 - 5.1 mmol/L 3.9 - 4.5  Chloride 98 - 111 mmol/L 101 - 101  CO2 22 - 32 mmol/L 29 - 25  Calcium 8.9 - 10.3 mg/dL 9.7 - 9.5  Total Protein 6.5 - 8.1 g/dL 7.0 - 6.7  Total Bilirubin 0.3 - 1.2 mg/dL 0.4 - 0.3  Alkaline Phos 38 - 126 U/L 67 - 53  AST 15 - 41 U/L 20 - 20  ALT 0 - 44 U/L 17 23 13     PATHOLOGY REPORT: Diagnosis 08/01/2014  1. Colon, biopsy, ascending and cecum, polyp (7) - TUBULAR ADENOMAS (7). NO HIGH GRADE DYSPLASIA OR MALIGNANCY IDENTIFIED. 2. Colon, biopsy, hepatic  flexure, polyp (3) - TUBULAR ADENOMAS (3). NO HIGH GRADE DYSPLASIA OR MALIGNANCY IDENTIFIED. 3. Colon, biopsy, transverse - TWO FRAGMENTS OF TUBULAR ADENOMA. NO HIGH GRADE DYSPLASIA OR MALIGNANCY IDENTIFIED. 4. Colon, biopsy, descending, sigmoid - TUBULAR ADENOMAS (3). NO HIGH GRADE DYSPLASIA OR MALIGNANCY IDENTIFIED. 5. Rectum, biopsy, distal mass - SQUAMOUS CELL CARCINOMA, BASALOID TYPE. - SEE MICROSCOPIC DESCRIPTION.  Colon, polyp(s), distal ascending 08/18/2014 - MULTIPLE FRAGMENTS OF TUBULAR ADENOMA. - NO HIGH GRADE DYSPLASIA OR MALIGNANCY.  Colonoscopy Pathology: Diagnosis 11/10/2015 Surgical [P], ascendingx3, transversex1, polyp (4) - TUBULAR ADENOMA (X3 FRAGMENTS). - BENIGN COLONIC MUCOSA (MULTIPLE FRAGMENTS). - NO HIGH GRADE DYSPLASIA OR MALIGNANCY.  RADIOGRAPHIC STUDIES:  PET 03/07/17 IMPRESSION: No evidence of colorectal carcinoma local recurrence or metastasis. Stable exam.   PET 03/06/2016 IMPRESSION: Negative PET scan for residual, recurrent or metastatic disease.  PET 02/23/2015 IMPRESSION: 1. Interval response to therapy (SUV 4.49. Previously 7.9.). 2. Decrease in FDG uptake associated with anal tumor. 3. No new or progressive disease identified within the chest, abdomen or pelvis.  PROCEDURES  Colonoscopy 11/10/2015 - Three 4 to 5 mm polyps in the ascending colon, removed with a hot snare. Resected and retrieved. - One 4 mm polyp in the transverse colon, removed with a cold snare. Resected and retrieved. - The examination was otherwise normal on direct and retroflexion views.   PET 03/07/2017 IMPRESSION: No evidence of colorectal carcinoma local recurrence or metastasis. Stable exam.   ASSESSMENT & PLAN: 72 y.o. Caucasian female with past medical history of hypertension, anxiety and depression, mild arthritis, who presents with watery diarrhea for 4 months.  1. Rectal squamous cell carcinoma, [id number], stage I  -I previously reviewed her colonoscopy  findings, the rectal mass biopsy results with patient and her son in great details. -We previously reviewed the natural history of rectal cancer, and staging workup. CT scans reviewed no local adenopathy or distant metastasis. she early stage rectal cancer. -She completed her concurrent chemoradiation, with 2 cycles of full doses of 5-FU and mitomycin. The goal of treatment was curative -I previously reviewed her restaging PET scan from 02/2015, which showed interval response and decreased FDG uptake in the and no mass. No other evidence of recurrence. We previously discussed that the residual hypermetabolic activity is possible related to her radiation, less likely residual tumor. -Colonoscopy on 11/10/15, revealed three 4-5 mm polyps in the ascending colon and these were removed. Pathology revealed no high grade dysplasia or malignancy. -I reviewed her restaging PET scan from 03/06/16, and this showed no residual, recurrent, or metastatic disease. -I reviewed her restaging PET from 03/07/17 which showed no evidence of residual, recurrent or metastatic disease.  -She is currently doing well, lab results reviewed with her, normal CBC and CMP, no evidence of recurrence. Rectal exam unremarkable during this encounter. -she saw Dr. Marcello Moores 3 months ago, anascope exam was also normal. -We will continue cancer surveillance. She is over 3 years since diagnosis. After 3 years the risk of recurrence will decrease significantly.  I do not plan to have more routine surveillance scans. -F/u with Dr. Marcello Moores in 3 months and f/u with me in 9 months.    2. Smoking cessation -I strongly encouraged her to stop smoking, she is willing to cut back.  3. Hypertension, anxiety -She'll continue follow-up with her primary care physician  4. Loose stools -Managed with imodium, mild and intermittent  -I encouraged her to do pelvic exercise  PLAN -Lab and f/u in 9 months, she will see Dr. Marcello Moores in 3 months  All  questions were answered. The patient knows to call the clinic with any problems, questions or concerns.  I spent 15 minutes counseling the patient face to face. The total time spent in the appointment was 20 minutes and more than 50% was on counseling.   Dierdre Searles Dweik am acting as scribe for Dr. Truitt Merle.  I have reviewed the above documentation for accuracy and completeness, and I agree with the above.    Truitt Merle, MD 09/10/2017 1:19 PM

## 2017-09-10 ENCOUNTER — Telehealth: Payer: Self-pay | Admitting: Hematology

## 2017-09-10 ENCOUNTER — Inpatient Hospital Stay: Payer: Medicare Other

## 2017-09-10 ENCOUNTER — Inpatient Hospital Stay: Payer: Medicare Other | Attending: Hematology | Admitting: Hematology

## 2017-09-10 VITALS — BP 178/70 | HR 68 | Temp 98.6°F | Resp 20 | Ht 62.0 in | Wt 141.4 lb

## 2017-09-10 DIAGNOSIS — C20 Malignant neoplasm of rectum: Secondary | ICD-10-CM

## 2017-09-10 DIAGNOSIS — F17210 Nicotine dependence, cigarettes, uncomplicated: Secondary | ICD-10-CM | POA: Diagnosis not present

## 2017-09-10 DIAGNOSIS — Z801 Family history of malignant neoplasm of trachea, bronchus and lung: Secondary | ICD-10-CM | POA: Diagnosis not present

## 2017-09-10 DIAGNOSIS — F419 Anxiety disorder, unspecified: Secondary | ICD-10-CM

## 2017-09-10 DIAGNOSIS — I10 Essential (primary) hypertension: Secondary | ICD-10-CM | POA: Diagnosis not present

## 2017-09-10 DIAGNOSIS — Z800 Family history of malignant neoplasm of digestive organs: Secondary | ICD-10-CM

## 2017-09-10 DIAGNOSIS — Z85048 Personal history of other malignant neoplasm of rectum, rectosigmoid junction, and anus: Secondary | ICD-10-CM

## 2017-09-10 DIAGNOSIS — R197 Diarrhea, unspecified: Secondary | ICD-10-CM | POA: Diagnosis not present

## 2017-09-10 DIAGNOSIS — Z79899 Other long term (current) drug therapy: Secondary | ICD-10-CM

## 2017-09-10 LAB — CBC WITH DIFFERENTIAL/PLATELET
BASOS PCT: 1 %
Basophils Absolute: 0 10*3/uL (ref 0.0–0.1)
EOS ABS: 0.2 10*3/uL (ref 0.0–0.5)
EOS PCT: 3 %
HCT: 39.7 % (ref 34.8–46.6)
HEMOGLOBIN: 13.3 g/dL (ref 11.6–15.9)
LYMPHS ABS: 0.9 10*3/uL (ref 0.9–3.3)
Lymphocytes Relative: 14 %
MCH: 31.8 pg (ref 25.1–34.0)
MCHC: 33.4 g/dL (ref 31.5–36.0)
MCV: 95 fL (ref 79.5–101.0)
MONO ABS: 0.6 10*3/uL (ref 0.1–0.9)
MONOS PCT: 10 %
Neutro Abs: 4.4 10*3/uL (ref 1.5–6.5)
Neutrophils Relative %: 72 %
Platelets: 245 10*3/uL (ref 145–400)
RBC: 4.17 MIL/uL (ref 3.70–5.45)
RDW: 13.3 % (ref 11.2–14.5)
WBC: 6.1 10*3/uL (ref 3.9–10.3)

## 2017-09-10 LAB — COMPREHENSIVE METABOLIC PANEL
ALBUMIN: 4 g/dL (ref 3.5–5.0)
ALK PHOS: 67 U/L (ref 38–126)
ALT: 17 U/L (ref 0–44)
ANION GAP: 6 (ref 5–15)
AST: 20 U/L (ref 15–41)
BUN: 12 mg/dL (ref 8–23)
CALCIUM: 9.7 mg/dL (ref 8.9–10.3)
CO2: 29 mmol/L (ref 22–32)
Chloride: 101 mmol/L (ref 98–111)
Creatinine, Ser: 0.76 mg/dL (ref 0.44–1.00)
GFR calc non Af Amer: >60 mL/min (ref >60)
GLUCOSE: 87 mg/dL (ref 70–99)
Potassium: 3.9 mmol/L (ref 3.5–5.1)
SODIUM: 136 mmol/L (ref 135–145)
TOTAL PROTEIN: 7 g/dL (ref 6.5–8.1)
Total Bilirubin: 0.4 mg/dL (ref 0.3–1.2)

## 2017-09-10 NOTE — Telephone Encounter (Signed)
Appointments scheduled letter/calendar mailed to patient per 7/3 los

## 2017-09-11 ENCOUNTER — Encounter: Payer: Self-pay | Admitting: Hematology

## 2017-09-13 ENCOUNTER — Other Ambulatory Visit: Payer: Self-pay | Admitting: Adult Health

## 2017-09-16 ENCOUNTER — Other Ambulatory Visit: Payer: Self-pay | Admitting: Adult Health

## 2017-09-22 ENCOUNTER — Encounter: Payer: Self-pay | Admitting: Adult Health

## 2017-09-22 ENCOUNTER — Telehealth: Payer: Self-pay | Admitting: Hematology

## 2017-09-22 NOTE — Telephone Encounter (Signed)
tried to call regarding voicemail however kept giving me a dial tone

## 2017-10-09 LAB — HM MAMMOGRAPHY

## 2017-10-13 ENCOUNTER — Other Ambulatory Visit: Payer: Self-pay | Admitting: Adult Health

## 2017-10-13 ENCOUNTER — Other Ambulatory Visit: Payer: Medicare Other

## 2017-12-10 ENCOUNTER — Ambulatory Visit: Payer: Self-pay

## 2017-12-11 ENCOUNTER — Ambulatory Visit (INDEPENDENT_AMBULATORY_CARE_PROVIDER_SITE_OTHER): Payer: Medicare Other

## 2017-12-11 VITALS — BP 146/76 | HR 88 | Temp 98.2°F

## 2017-12-11 DIAGNOSIS — Z23 Encounter for immunization: Secondary | ICD-10-CM

## 2017-12-12 ENCOUNTER — Emergency Department (HOSPITAL_COMMUNITY)
Admission: EM | Admit: 2017-12-12 | Discharge: 2017-12-12 | Disposition: A | Discharge status: Home / Self Care | Payer: Medicare Other | Attending: Emergency Medicine | Admitting: Emergency Medicine

## 2017-12-12 ENCOUNTER — Other Ambulatory Visit: Payer: Self-pay

## 2017-12-12 ENCOUNTER — Emergency Department (HOSPITAL_COMMUNITY): Payer: Medicare Other

## 2017-12-12 DIAGNOSIS — I10 Essential (primary) hypertension: Secondary | ICD-10-CM | POA: Diagnosis not present

## 2017-12-12 DIAGNOSIS — R55 Syncope and collapse: Secondary | ICD-10-CM | POA: Insufficient documentation

## 2017-12-12 DIAGNOSIS — Z7982 Long term (current) use of aspirin: Secondary | ICD-10-CM | POA: Insufficient documentation

## 2017-12-12 DIAGNOSIS — N390 Urinary tract infection, site not specified: Secondary | ICD-10-CM | POA: Insufficient documentation

## 2017-12-12 DIAGNOSIS — Z79899 Other long term (current) drug therapy: Secondary | ICD-10-CM | POA: Diagnosis not present

## 2017-12-12 DIAGNOSIS — F17210 Nicotine dependence, cigarettes, uncomplicated: Secondary | ICD-10-CM | POA: Insufficient documentation

## 2017-12-12 LAB — CBG MONITORING, ED: GLUCOSE-CAPILLARY: 105 mg/dL — AB (ref 70–99)

## 2017-12-12 LAB — CBC
HCT: 42 % (ref 36.0–46.0)
Hemoglobin: 13.9 g/dL (ref 12.0–15.0)
MCH: 31.2 pg (ref 26.0–34.0)
MCHC: 33.1 g/dL (ref 30.0–36.0)
MCV: 94.4 fL (ref 78.0–100.0)
PLATELETS: 168 10*3/uL (ref 150–400)
RBC: 4.45 MIL/uL (ref 3.87–5.11)
RDW: 13 % (ref 11.5–15.5)
WBC: 5.9 10*3/uL (ref 4.0–10.5)

## 2017-12-12 LAB — URINALYSIS, ROUTINE W REFLEX MICROSCOPIC
BILIRUBIN URINE: NEGATIVE
Glucose, UA: NEGATIVE mg/dL
Ketones, ur: NEGATIVE mg/dL
Nitrite: POSITIVE — AB
PH: 6 (ref 5.0–8.0)
Protein, ur: NEGATIVE mg/dL
SPECIFIC GRAVITY, URINE: 1.01 (ref 1.005–1.030)
WBC, UA: >50 WBC/hpf — ABNORMAL HIGH (ref 0–5)

## 2017-12-12 LAB — BASIC METABOLIC PANEL
ANION GAP: 9 (ref 5–15)
BUN: 7 mg/dL — ABNORMAL LOW (ref 8–23)
CALCIUM: 9.3 mg/dL (ref 8.9–10.3)
CO2: 24 mmol/L (ref 22–32)
Chloride: 101 mmol/L (ref 98–111)
Creatinine, Ser: 0.87 mg/dL (ref 0.44–1.00)
Glucose, Bld: 115 mg/dL — ABNORMAL HIGH (ref 70–99)
Potassium: 3.5 mmol/L (ref 3.5–5.1)
Sodium: 134 mmol/L — ABNORMAL LOW (ref 135–145)

## 2017-12-12 LAB — I-STAT TROPONIN, ED: Troponin i, poc: 0 ng/mL (ref 0.00–0.08)

## 2017-12-12 MED ORDER — CEPHALEXIN 500 MG PO CAPS: 500.0000 mg | ORAL_CAPSULE | Freq: Two times a day (BID) | ORAL | 0 refills | Status: DC

## 2017-12-12 MED ORDER — SODIUM CHLORIDE 0.9 % IV SOLN
1.0000 g | Freq: Once | INTRAVENOUS | Status: AC
  Administered 2017-12-12: 1 g via INTRAVENOUS
  Filled 2017-12-12: qty 10

## 2017-12-12 NOTE — ED Triage Notes (Signed)
Per pt she was at home and was walking into the kitchen and could feel herself getting weak and felt very tired. Pt passed out according to son. Son stated she hit her head but no visual sign of any trauma to head. alert oriented x 4. Pt stated she did get her flu shot today.118/78, hr 89, ra 94% cbg 115, 14 rr.

## 2017-12-12 NOTE — ED Provider Notes (Signed)
[facility] DEPARTMENT Provider Note   CSN: [id number] Arrival date & time: 12/12/17  0032     History   Chief Complaint Chief Complaint  Patient presents with  . Loss of Consciousness    HPI Glenda Stevens is a 72 y.o. female.  Patient presents to the emergency department for evaluation of syncope.  Patient reports that she was sitting at the computer for some time, stood up to go to the kitchen and passed out.  Son reports that she fell forward, did hit her head on the counter.  She is without complaints currently.  She does not think she has any injuries from the fall.  Denies neck pain, back pain, extremity pain.  She is not expensing any chest pain, heart palpitations, shortness of breath.  She admits to drinking some wine tonight and taking her Xanax but this is not any different than usual.  She did get a flu shot today, has not had any fever, chills or flu symptoms.     Past Medical History:  Diagnosis Date  . Allergy    Hay fever  . Anxiety   . Arthritis   . 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 GERD (gastroesophageal reflux disease)   . History of chicken pox   . Hyperlipidemia   . Hypertension   . Phlebitis and thrombophlebitis   . PONV (postoperative nausea and vomiting)   . Renal artery stenosis ([facility])    left stent in 2003  . Renovascular hypertension   . S/P radiation therapy 09/05/14-10/19/14   anal ca,50.4Gy    Patient Active Problem List   Diagnosis Date Noted  . Healthcare maintenance 05/01/2017  . Corneal abrasion, right 10/17/2016  . Vitamin D deficiency 07/18/2016  . Other fatigue 05/09/2016  . Bilateral hip pain 05/09/2016  . Tinea corporis 05/09/2016  . Foot pain, bilateral 05/09/2016  . Medicare annual wellness visit, initial 07/06/2015  . Anxiety 07/06/2015  . Rectal cancer ([place]) 08/01/2014  . Diarrhea 07/08/2014  . Seborrheic keratoses 03/18/2014  . Nasal congestion 09/14/2013  . Smoker  09/14/2013  . Renal artery stenosis ([place]) 08/06/2013  . Hyperlipidemia 08/06/2013  . Essential hypertension 08/06/2013    Past Surgical History:  Procedure Laterality Date  . ABDOMINAL HYSTERECTOMY    . BREAST SURGERY     bilateral lumpectomy-benign  . CATARACT EXTRACTION Bilateral   . COLON RESECTION N/A 08/18/2014   Procedure: LAPAROSCOPIC ASSISTED  COLON POLYP RESECTION , DIAGNOSTIC LAPROSCOPY ;  Surgeon: Leighton Ruff, MD;  Location: [facility];  Service: General;  Laterality: N/A;  . NASAL SINUS SURGERY     patient denies having had nasal sinus surgery  . NM MYOCAR PERF WALL MOTION  09/2009   bruce myoview - normal pattern of perfusion, EF 83%, low risk scan  . RENAL ARTERY ANGIOPLASTY  07/02/2001   6x21mm Genesis on Aviator balloon stent overlapping a 6x24mm Genesist on Aviator balloon stent (Dr. Marella Chimes)  . RENAL ARTERY STENT    . RENAL DOPPLER  09/2012   left renal artery stent - 60-99% diameter reduction  . TRANSTHORACIC ECHOCARDIOGRAM  06/2012   EF 55-60%, mild MR     OB History   None      Home Medications    Prior to Admission medications   Medication Sig Start Date End Date Taking? Authorizing Provider  ALPRAZolam (XANAX) 0.25 MG tablet Take 0.125 mg by mouth daily as needed for anxiety.   Yes [provider]  amLODipine (NORVASC) 2.5 MG tablet TAKE 1 TABLET BY MOUTH  DAILY Patient taking differently: Take 2.5 mg by mouth daily.  10/14/17  Yes Danford, Katy D, NP  aspirin EC 81 MG tablet Take 81 mg by mouth daily.   Yes [provider]  cholecalciferol (VITAMIN D) 1000 units tablet Take 1,000 Units by mouth daily.   Yes [provider]  Multiple Vitamin (MULTIVITAMIN) tablet Take 1 tablet by mouth daily.   Yes [provider]  Omega-3 Fatty Acids (FISH OIL PO) Take 1 tablet by mouth daily.   Yes [provider]  propranolol (INDERAL) 20 MG tablet TAKE 1 TABLET BY MOUTH 3  TIMES DAILY Patient taking differently: Take 20 mg  by mouth 3 (three) times daily.  09/01/17  Yes Danford, [name] D, NP  ranitidine (ZANTAC) 150 MG tablet Take 150 mg by mouth daily as needed for heartburn.   Yes [provider]  sertraline (ZOLOFT) 100 MG tablet Take 100 mg by mouth daily.    Yes [provider]  atorvastatin (LIPITOR) 40 MG tablet Take 1 tablet (40 mg total) by mouth daily. Patient not taking: Reported on 12/12/2017 06/23/17   [name] D, NP  cephALEXin (KEFLEX) 500 MG capsule Take 1 capsule (500 mg total) by mouth 2 (two) times daily. 12/12/17   Orpah Greek, MD  ketoconazole (NIZORAL) 2 % shampoo APPLY 1 APPLICATION TOPICALLY 2 (TWO) TIMES A WEEK. Patient not taking: Reported on 12/12/2017 09/08/17   [name] D, NP  loperamide (IMODIUM) 2 MG capsule States taking 1/2 tab daily Patient not taking: Reported on 12/12/2017 07/03/15   Tonia Ghent, MD    Family History Family History  Problem Relation Age of Onset  . Hypertension Mother   . Heart disease Mother   . Dementia Mother   . Hyperlipidemia Mother   . Heart disease Father   . Hyperlipidemia Father   . Cancer Father        lung cancer and prostate cancer   . Hypertension Father   . Breast cancer Cousin   . Cancer Cousin        4 paternal and 1 maternal cousins had breast cancer   . Healthy Brother   . Colon cancer Neg Hx   . Esophageal cancer Neg Hx   . Stomach cancer Neg Hx   . Rectal cancer Neg Hx     Social History Social History   Tobacco Use  . Smoking status: Current Some Day Smoker    Packs/day: 1.00    Years: 30.00    Pack years: 30.00    Types: Cigarettes  . Smokeless tobacco: Never Used  Substance Use Topics  . Alcohol use: Yes    Alcohol/week: 7.0 standard drinks    Types: 7 Glasses of wine per week  . Drug use: No     Allergies   Patient has no known allergies.   Review of Systems Review of Systems  Neurological: Positive for syncope.  All other systems reviewed and are  negative.    Physical Exam Updated Vital Signs BP 138/62   Pulse 83   Temp 99.5 F (37.5 C) (Oral)   Resp (!) 27   Ht 5\' 3"  (1.6 m)   Wt 63.5 kg   SpO2 94%   BMI 24.80 kg/m   Physical Exam  Constitutional: She is oriented to person, place, and time. She appears well-developed and well-nourished. No distress.  HENT:  Head: Normocephalic and  atraumatic.  Right Ear: Hearing normal.  Left Ear: Hearing normal.  Nose: Nose normal.  Mouth/Throat: Oropharynx is clear and moist and mucous membranes are normal.  Eyes: Pupils are equal, round, and reactive to light. Conjunctivae and EOM are normal.  Neck: Normal range of motion. Neck supple.  Cardiovascular: Regular rhythm, S1 normal and S2 normal. Exam reveals no gallop and no friction rub.  No murmur heard. Pulmonary/Chest: Effort normal and breath sounds normal. No respiratory distress. She exhibits no tenderness.  Abdominal: Soft. Normal appearance and bowel sounds are normal. There is no hepatosplenomegaly. There is no tenderness. There is no rebound, no guarding, no tenderness at McBurney's point and negative Murphy's sign. No hernia.  Musculoskeletal: Normal range of motion.  Neurological: She is alert and oriented to person, place, and time. She has normal strength. No cranial nerve deficit or sensory deficit. Coordination normal. GCS eye subscore is 4. GCS verbal subscore is 5. GCS motor subscore is 6.  Skin: Skin is warm, dry and intact. No rash noted. No cyanosis.  Psychiatric: She has a normal mood and affect. Her speech is normal and behavior is normal. Thought content normal.  Nursing note and vitals reviewed.    ED Treatments / Results  Labs (all labs ordered are listed, but only abnormal results are displayed) Labs Reviewed  BASIC METABOLIC PANEL - Abnormal; Notable for the following components:      Result Value   Sodium 134 (*)    Glucose, Bld 115 (*)    BUN 7 (*)    All other components within normal limits   URINALYSIS, ROUTINE W REFLEX MICROSCOPIC - Abnormal; Notable for the following components:   APPearance HAZY (*)    Hgb urine dipstick SMALL (*)    Nitrite POSITIVE (*)    Leukocytes, UA LARGE (*)    WBC, UA >50 (*)    Bacteria, UA MANY (*)    All other components within normal limits  CBG MONITORING, ED - Abnormal; Notable for the following components:   Glucose-Capillary 105 (*)    All other components within normal limits  URINE CULTURE  CBC  TROPONIN I  CBG MONITORING, ED  I-STAT TROPONIN, ED    EKG EKG Interpretation  Date/Time:  Friday December 12 2017 00:37:03 EDT Ventricular Rate:  88 PR Interval:    QRS Duration: 87 QT Interval:  365 QTC Calculation: 442 R Axis:   56 Text Interpretation:  Sinus rhythm Borderline repolarization abnormality Confirmed by Orpah Greek [phone]) on 12/12/2017 4:18:17 AM   Radiology Ct Head Wo Contrast  Result Date: 12/12/2017 CLINICAL DATA:  Altered level of consciousness (LOC), unexplained. Syncope striking head. EXAM: CT HEAD WITHOUT CONTRAST TECHNIQUE: Contiguous axial images were obtained from the base of the skull through the vertex without intravenous contrast. COMPARISON:  Brain and orbital MRI 12/25/2005 FINDINGS: Brain: Generalized cerebral atrophy, slightly advanced for age. Moderate chronic small vessel ischemia. Remote lacunar infarct in the left greater than right basal ganglia. No intracranial hemorrhage, mass effect, or midline shift. No hydrocephalus. The basilar cisterns are patent. No evidence of territorial infarct or acute ischemia. No extra-axial or intracranial fluid collection. Vascular: Atherosclerosis of skullbase vasculature without hyperdense vessel or abnormal calcification. Skull: No fracture or focal lesion. Sinuses/Orbits: Rounded bilateral well-defined infraorbital lesions with erosion and remodeling of the orbital floor, not significantly changed from remote MRI. Bilateral lens extraction. Small fluid  level in left maxillary sinus. Minimal opacification of lower left mastoid air cells. Other: None. IMPRESSION: 1.  No acute intracranial abnormality. 2. Generalized atrophy with moderate chronic small vessel ischemia. 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 3. Rounded bilateral well-defined infraorbital lesions with orbital remottling, previously characterized as schwannomas. No significant change compared with remote 2007 brain and orbital MRI. Electronically Signed   By: Keith Rake M.D.   On: 12/12/2017 01:42    Procedures Procedures (including critical care time)  Medications Ordered in ED Medications  cefTRIAXone (ROCEPHIN) 1 g in sodium chloride 0.9 % 100 mL IVPB (0 g Intravenous Stopped 12/12/17 0539)     Initial Impression / Assessment and Plan / ED Course  I have reviewed the triage vital signs and the nursing notes.  Pertinent labs & imaging results that were available during my care of the patient were reviewed by me and considered in my medical decision making (see chart for details).     Presents to the emergency department for evaluation of syncope.  Patient reports that she has been sitting at the computer for some time, stood up to go to the kitchen and passed out while walking.  She did hit her head on a cabinet, according to her son that was with her.  She denies any significant injury, however.  CT head unremarkable.  No neck or back pain or tenderness.  Blood work unremarkable, vital signs normal.  She does have evidence of urinary tract infection.  This likely in conjunction with Xanax and alcohol caused syncope.  She appears well, does not have any red flags that would require hospitalization.  Patient treated with Rocephin and outpatient Keflex.  Culture pending.  Final Clinical Impressions(s) / ED Diagnoses   Final diagnoses:  Syncope, unspecified syncope type  Urinary tract infection without hematuria, site unspecified    ED Discharge Orders          Ordered    cephALEXin (KEFLEX) 500 MG capsule  2 times daily     12/12/17 0547           Orpah Greek, MD 12/12/17 [phone]

## 2017-12-14 LAB — URINE CULTURE

## 2017-12-15 ENCOUNTER — Telehealth: Payer: Self-pay | Admitting: Emergency Medicine

## 2017-12-15 NOTE — Progress Notes (Addendum)
ED Antimicrobial Stewardship Positive Culture Follow Up   Glenda Stevens is an 72 y.o. female who presented to [facility] on 12/12/2017 with a chief complaint of  Chief Complaint  Patient presents with  . Loss of Consciousness    Recent Results (from the past 720 hour(s))  Urine Culture     Status: Abnormal   Collection Time: 12/12/17  2:06 AM  Result Value Ref Range Status   Specimen Description URINE, RANDOM  Final   Special Requests   Final    NONE Performed at [facility], [address] [place], [place] [address]    Culture >=100,000 COLONIES/mL ESCHERICHIA COLI (A)  Final   Report Status 12/14/2017 FINAL  Final   Organism ID, Bacteria ESCHERICHIA COLI (A)  Final      Susceptibility   Escherichia coli - MIC*    AMPICILLIN <=2 SENSITIVE Sensitive     CEFAZOLIN <=4 SENSITIVE Sensitive     CEFTRIAXONE <=1 SENSITIVE Sensitive     CIPROFLOXACIN <=0.25 SENSITIVE Sensitive     GENTAMICIN <=1 SENSITIVE Sensitive     IMIPENEM <=0.25 SENSITIVE Sensitive     NITROFURANTOIN <=16 SENSITIVE Sensitive     TRIMETH/SULFA <=20 SENSITIVE Sensitive     AMPICILLIN/SULBACTAM <=2 SENSITIVE Sensitive     PIP/TAZO <=4 SENSITIVE Sensitive     Extended ESBL NEGATIVE Sensitive     * >=100,000 COLONIES/mL ESCHERICHIA COLI   Patient presented with no urinary symptoms, antibiotics not indicated. Stop antibiotics.    ED Provider: Aletta Edouard, MD   Cleotis Lema 12/15/2017, 9:01 AM PharmD Candidate Monday - Friday phone -  [phone] Saturday - Sunday phone - [phone]

## 2017-12-15 NOTE — Telephone Encounter (Signed)
Post ED Visit - Positive Culture Follow-up: Successful Patient Follow-Up  Culture assessed and recommendations reviewed by:  []  Elenor Quinones, Pharm.D. []  Heide Guile, Pharm.D., BCPS AQ-ID []  Parks Neptune, Pharm.D., BCPS []  Alycia Rossetti, Pharm.D., BCPS []  Bearden, Pharm.D., BCPS, AAHIVP []  Legrand Como, Pharm.D., BCPS, AAHIVP []  Salome Arnt, PharmD, BCPS []  Johnnette Gourd, PharmD, BCPS []  Hughes Better, PharmD, BCPS []  Leeroy Cha, PharmD Roopah Donneta Romberg PharmD  Positive urine culture  []  Patient discharged without antimicrobial prescription and treatment is now indicated []  Organism is resistant to prescribed ED discharge antimicrobial []  Patient with positive blood cultures  Changes discussed with ED provider: Aletta Edouard PA New antibiotic prescription stop antibiotic  Attempting to contact patient   Hazle Nordmann 12/15/2017, 1:16 PM

## 2017-12-21 ENCOUNTER — Other Ambulatory Visit: Payer: Self-pay | Admitting: Adult Health

## 2018-01-12 ENCOUNTER — Other Ambulatory Visit: Payer: Self-pay

## 2018-01-23 ENCOUNTER — Telehealth: Payer: Self-pay | Admitting: Adult Health

## 2018-01-23 NOTE — Telephone Encounter (Signed)
Patient is requesting a refill of her propranolol 20mg , she states that she doesn't need a full refill amount (she said "like 15"). If approved please send to [facility] and not the mail order pharm she typically uses.

## 2018-01-26 MED ORDER — PROPRANOLOL HCL 20 MG PO TABS: 20.0000 mg | ORAL_TABLET | Freq: Three times a day (TID) | ORAL | 0 refills | Status: DC

## 2018-01-26 NOTE — Addendum Note (Signed)
Addended by: Fonnie Mu on: 01/26/2018 09:50 AM   Modules accepted: Orders

## 2018-03-23 ENCOUNTER — Other Ambulatory Visit: Payer: Self-pay | Admitting: Adult Health

## 2018-03-25 ENCOUNTER — Other Ambulatory Visit: Payer: Self-pay | Admitting: Adult Health

## 2018-03-30 NOTE — Progress Notes (Signed)
Subjective:    Patient ID: Glenda Stevens, female    DOB: 10-24-1945, 73 y.o.   MRN: [id number]  HPI:  06/23/17 OV: Glenda Stevens presents with  She is currently on atenolol 50mg  QDand Candesaretan 32mg  QD. She reports "feeling flushed in the face" for the last few weeks, which prompted her to check BP She reports home BP readings: SBP 150-170s, DBP 90s She denies CP/increased dyspnea/palpitations. She was re-started on Atorvastatin 40mg  QD on 05/14/17- however she reports not taking med as directed. She has reduced tobacco use "by a few cigarettes daily" and is willing to try Nicoderm patch again. She has been trying to increase regular walking, however is hesitant to walk "[name]" in neighborhood after he was attacked by another dog. She denies ETOH use She reports increase in acute anxiety and is open to referral to mental health/therapist.  She she psychiatrist annually and is currently on Sertraline 100mg  QD and Alprazalom 0.25 PRN daily.   She denies thoughts of harming herself/others She has support system of local son and friends.  07/07/17 OV: Glenda Stevens presents for f/u: HTN, anxiety Changes to antihypertensives two weeks ago- Stop Atelonol 50mg  daily. Start Propanolol 20mg , three times daily. Continue Candesartan 32 mg QD Continue Amlodipine 2.5mg  QD She reports home readings- SBP 110-140 DBP 60-80 HR 70-90 Referral to psychology was placed She is taking Sertraline 100mg  QD and Alprazolam 0.25mg  PRN- reports She denies thoughts of harming herself/others She reports that her overall mood has stabalized She was concerned when her SBP was 110, so she stopped taking Candesartan 32mg  She was started on this ARB after renal stent placed, 10-15 years ago  03/31/2018 OV: Glenda Stevens is here for f/u: HTN and anxiety  She is on Amlodipine 2.5mg  QD and propranolol 20mg  TID She reports home BP readings- SBP 110-130s, mean 120 DBP 70-80 She reports mood is stable- denies thoughts of harming  herself/others She continues to smoke, pack/day- declined tobacco cessation She is still hesitant to walk her dog [name] since he was attacked  She denies acute complaints today  Patient Care Team    Relationship Specialty Notifications Start End  Douglas, [name] D, NP PCP - General Family Medicine  09/10/69   Leighton Ruff, [place] Physician General Surgery  10/24/14   Kyung Rudd, MD Consulting Physician Radiation Oncology  10/24/14     Patient Active Problem List   Diagnosis Date Noted  . Healthcare maintenance 05/01/2017  . Corneal abrasion, right 10/17/2016  . Vitamin D deficiency 07/18/2016  . Other fatigue 05/09/2016  . Bilateral hip pain 05/09/2016  . Tinea corporis 05/09/2016  . Foot pain, bilateral 05/09/2016  . Medicare annual wellness visit, initial 07/06/2015  . Anxiety 07/06/2015  . Rectal cancer ([place]) 08/01/2014  . Diarrhea 07/08/2014  . Seborrheic keratoses 03/18/2014  . Nasal congestion 09/14/2013  . Smoker 09/14/2013  . Renal artery stenosis ([place]) 08/06/2013  . Hyperlipidemia 08/06/2013  . Essential hypertension 08/06/2013     Past Medical History:  Diagnosis Date  . Allergy    Hay fever  . Anxiety   . Arthritis   . 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 GERD (gastroesophageal reflux disease)   . History of chicken pox   . Hyperlipidemia   . Hypertension   . Phlebitis and thrombophlebitis   . PONV (postoperative nausea and vomiting)   . Renal artery stenosis ([facility])    left stent in 2003  . Renovascular hypertension   .  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 ABDOMINAL HYSTERECTOMY    . BREAST SURGERY     bilateral lumpectomy-benign  . CATARACT EXTRACTION Bilateral   . COLON RESECTION N/A 08/18/2014   Procedure: LAPAROSCOPIC ASSISTED  COLON POLYP RESECTION , DIAGNOSTIC LAPROSCOPY ;  Surgeon: Leighton Ruff, MD;  Location: [facility];  Service: General;   Laterality: N/A;  . NASAL SINUS SURGERY     patient denies having had nasal sinus surgery  . NM MYOCAR PERF WALL MOTION  09/2009   bruce myoview - normal pattern of perfusion, EF 83%, low risk scan  . RENAL ARTERY ANGIOPLASTY  07/02/2001   6x84mm Genesis on Aviator balloon stent overlapping a 6x68mm Genesist on Aviator balloon stent (Dr. Marella Chimes)  . RENAL ARTERY STENT    . RENAL DOPPLER  09/2012   left renal artery stent - 60-99% diameter reduction  . TRANSTHORACIC ECHOCARDIOGRAM  06/2012   EF 55-60%, mild MR     Family History  Problem Relation Age of Onset  . Hypertension Mother   . Heart disease Mother   . Dementia Mother   . Hyperlipidemia Mother   . Heart disease Father   . Hyperlipidemia Father   . Cancer Father        lung cancer and prostate cancer   . Hypertension Father   . Breast cancer Cousin   . Cancer Cousin        4 paternal and 1 maternal cousins had breast cancer   . Healthy Brother   . Colon cancer Neg Hx   . Esophageal cancer Neg Hx   . Stomach cancer Neg Hx   . Rectal cancer Neg Hx      Social History   Substance and Sexual Activity  Drug Use No     Social History   Substance and Sexual Activity  Alcohol Use Yes  . Alcohol/week: 7.0 standard drinks  . Types: 7 Glasses of wine per week     Social History   Tobacco Use  Smoking Status Current Some Day Smoker  . Packs/day: 1.00  . Years: 30.00  . Pack years: 30.00  . Types: Cigarettes  Smokeless Tobacco Never Used     Outpatient Encounter Medications as of 03/31/2018  Medication Sig  . ALPRAZolam (XANAX) 0.25 MG tablet Take 0.125 mg by mouth daily as needed for anxiety.  Marland Kitchen amLODipine (NORVASC) 2.5 MG tablet Take 1 tablet (2.5 mg total) by mouth daily.  Marland Kitchen aspirin EC 81 MG tablet Take 81 mg by mouth daily.  . cholecalciferol (VITAMIN D) 1000 units tablet Take 1,000 Units by mouth daily.  Marland Kitchen ketoconazole (NIZORAL) 2 % shampoo APPLY 1 APPLICATION TOPICALLY 2 (TWO) TIMES A WEEK.  Marland Kitchen  loperamide (IMODIUM) 2 MG capsule States taking 1/2 tab daily  . Multiple Vitamin (MULTIVITAMIN) tablet Take 1 tablet by mouth daily.  . Omega-3 Fatty Acids (FISH OIL PO) Take 1 tablet by mouth daily.  . propranolol (INDERAL) 20 MG tablet Take 1 tablet (20 mg total) by mouth 3 (three) times daily.  . ranitidine (ZANTAC) 150 MG tablet Take 150 mg by mouth daily as needed for heartburn.  . sertraline (ZOLOFT) 100 MG tablet Take 100 mg by mouth daily.   . [DISCONTINUED] amLODipine (NORVASC) 2.5 MG tablet Take 1 tablet (2.5 mg total) by mouth daily. PATIENT NEEDS OFFICE VISIT PRIOR TO ANY FURTHER REFILLS  . [DISCONTINUED] propranolol (INDERAL) 20 MG tablet  Take 1 tablet (20 mg total) by mouth 3 (three) times daily.  . [DISCONTINUED] atorvastatin (LIPITOR) 40 MG tablet Take 1 tablet (40 mg total) by mouth daily.  . [DISCONTINUED] cephALEXin (KEFLEX) 500 MG capsule Take 1 capsule (500 mg total) by mouth 2 (two) times daily.   Facility-Administered Encounter Medications as of 03/31/2018  Medication  . 0.9 %  sodium chloride infusion    Allergies: Patient has no known allergies.  Body mass index is 26.34 kg/m.  Blood pressure (!) 151/74, pulse 65, temperature 98.2 F (36.8 C), temperature source Oral, height 5\' 2"  (1.575 m), weight 144 lb (65.3 kg), SpO2 98 %.  Review of Systems  Constitutional: Positive for fatigue. Negative for activity change, appetite change, chills, diaphoresis, fever and unexpected weight change.  Eyes: Negative for visual disturbance.  Respiratory: Negative for cough, chest tightness, shortness of breath, wheezing and stridor.   Cardiovascular: Negative for chest pain, palpitations and leg swelling.  Gastrointestinal: Negative for abdominal distention, abdominal pain, blood in stool, constipation, diarrhea, nausea and vomiting.  Genitourinary: Negative for difficulty urinating.  Neurological: Negative for dizziness, tremors, weakness and headaches.  Hematological: Does  not bruise/bleed easily.  Psychiatric/Behavioral: Negative for agitation, behavioral problems, confusion, decreased concentration, dysphoric mood, hallucinations, self-injury, sleep disturbance and suicidal ideas. The patient is not nervous/anxious and is not hyperactive.        Objective:   Physical Exam  Constitutional: She is oriented to person, place, and time. She appears well-developed and well-nourished. No distress.  HENT:  Head: Normocephalic and atraumatic.  Right Ear: External ear normal.  Left Ear: External ear normal.  Eyes: Pupils are equal, round, and reactive to light. Conjunctivae are normal.  Cardiovascular: Normal rate, regular rhythm, normal heart sounds and intact distal pulses.  No murmur heard. Pulmonary/Chest: Effort normal and breath sounds normal. No respiratory distress. She has no wheezes. She has no rales. She exhibits no tenderness.  Neurological: She is alert and oriented to person, place, and time.  Skin: Skin is warm and dry. No rash noted. She is not diaphoretic. No erythema. No pallor.  Psychiatric: She has a normal mood and affect. Her behavior is normal. Judgment and thought content normal.  Nursing note and vitals reviewed.     Assessment & Plan:   1. Healthcare maintenance   2. Essential hypertension   3. Anxiety     Healthcare maintenance Continue all medications as directed. Continue to drink plenty of water and follow Mediterranean Diet. Try to walk [name] as often as you can, recommend church walking paths, [employer]. Please check your blood pressure and heart rate 1-2 times/week or if you ever feel unusual. Please follow-up in 6 months for complete physical, fasting labs the week prior.  Essential hypertension Office reading above goal, however-  Home readings are at goal- SBP 110-130s, mean 120 DBP 70-80 Continue  Amlodipine 2.5mg  QD and propranolol 20mg  TID Recommend checking BP/HR 1-2 times week, call if consistently  >140/80  Anxiety Stable propranolol 20mg  TID Increase regular walking     FOLLOW-UP:  Return in about 6 months (around 09/29/2018) for CPE, Fasting Labs.

## 2018-03-31 ENCOUNTER — Ambulatory Visit (INDEPENDENT_AMBULATORY_CARE_PROVIDER_SITE_OTHER): Payer: Medicare Other | Admitting: Adult Health

## 2018-03-31 ENCOUNTER — Encounter: Payer: Self-pay | Admitting: Adult Health

## 2018-03-31 DIAGNOSIS — F419 Anxiety disorder, unspecified: Secondary | ICD-10-CM

## 2018-03-31 DIAGNOSIS — Z0000 Encounter for general adult medical examination without abnormal findings: Secondary | ICD-10-CM | POA: Diagnosis not present

## 2018-03-31 DIAGNOSIS — I10 Essential (primary) hypertension: Secondary | ICD-10-CM

## 2018-03-31 MED ORDER — PROPRANOLOL HCL 20 MG PO TABS: 20.0000 mg | ORAL_TABLET | Freq: Three times a day (TID) | ORAL | 2 refills | Status: DC

## 2018-03-31 MED ORDER — AMLODIPINE BESYLATE 2.5 MG PO TABS: 2.5000 mg | ORAL_TABLET | Freq: Every day | ORAL | 2 refills | Status: DC

## 2018-03-31 NOTE — Assessment & Plan Note (Addendum)
Office reading above goal, however-  Home readings are at goal- SBP 110-130s, mean 120 DBP 70-80 Continue  Amlodipine 2.5mg  QD and propranolol 20mg  TID Recommend checking BP/HR 1-2 times week, call if consistently >140/80

## 2018-03-31 NOTE — Patient Instructions (Addendum)
Mediterranean Diet A Mediterranean diet refers to food and lifestyle choices that are based on the traditions of countries located on the [employer]. This way of eating has been shown to help prevent certain conditions and improve outcomes for people who have chronic diseases, like kidney disease and heart disease. What are tips for following this plan? Lifestyle  Cook and eat meals together with your family, when possible.  Drink enough fluid to keep your urine clear or pale yellow.  Be physically active every day. This includes: ? Aerobic exercise like running or swimming. ? Leisure activities like gardening, walking, or housework.  Get 7-8 hours of sleep each night.  If recommended by your health care provider, drink red wine in moderation. This means 1 glass a day for nonpregnant women and 2 glasses a day for men. A glass of wine equals 5 oz (150 mL). Reading food labels   Check the serving size of packaged foods. For foods such as rice and pasta, the serving size refers to the amount of cooked product, not dry.  Check the total fat in packaged foods. Avoid foods that have saturated fat or trans fats.  Check the ingredients list for added sugars, such as corn syrup. Shopping  At the grocery store, buy most of your food from the areas near the walls of the store. This includes: ? Fresh fruits and vegetables (produce). ? Grains, beans, nuts, and seeds. Some of these may be available in unpackaged forms or large amounts (in bulk). ? Fresh seafood. ? Poultry and eggs. ? Low-fat dairy products.  Buy whole ingredients instead of prepackaged foods.  Buy fresh fruits and vegetables in-season from local farmers markets.  Buy frozen fruits and vegetables in resealable bags.  If you do not have access to quality fresh seafood, buy precooked frozen shrimp or canned fish, such as tuna, salmon, or sardines.  Buy small amounts of raw or cooked vegetables, salads, or olives from  the deli or salad bar at your store.  Stock your pantry so you always have certain foods on hand, such as olive oil, canned tuna, canned tomatoes, rice, pasta, and beans. Cooking  Cook foods with extra-virgin olive oil instead of using butter or other vegetable oils.  Have meat as a side dish, and have vegetables or grains as your main dish. This means having meat in small portions or adding small amounts of meat to foods like pasta or stew.  Use beans or vegetables instead of meat in common dishes like chili or lasagna.  Experiment with different cooking methods. Try roasting or broiling vegetables instead of steaming or sauteing them.  Add frozen vegetables to soups, stews, pasta, or rice.  Add nuts or seeds for added healthy fat at each meal. You can add these to yogurt, salads, or vegetable dishes.  Marinate fish or vegetables using olive oil, lemon juice, garlic, and fresh herbs. Meal planning   Plan to eat 1 vegetarian meal one day each week. Try to work up to 2 vegetarian meals, if possible.  Eat seafood 2 or more times a week.  Have healthy snacks readily available, such as: ? Vegetable sticks with hummus. ? [place] yogurt. ? Fruit and nut trail mix.  Eat balanced meals throughout the week. This includes: ? Fruit: 2-3 servings a day ? Vegetables: 4-5 servings a day ? Low-fat dairy: 2 servings a day ? Fish, poultry, or lean meat: 1 serving a day ? Beans and legumes: 2 or more servings a week ?  Nuts and seeds: 1-2 servings a day ? Whole grains: 6-8 servings a day ? Extra-virgin olive oil: 3-4 servings a day  Limit red meat and sweets to only a few servings a month What are my food choices?  Mediterranean diet ? Recommended ? Grains: Whole-grain pasta. Brown rice. Bulgar wheat. Polenta. Couscous. Whole-wheat bread. [place]. ? Vegetables: Artichokes. Beets. Broccoli. Cabbage. Carrots. Eggplant. Green beans. Chard. Kale. Spinach. Onions. Leeks. Peas. Squash.  Tomatoes. Peppers. Radishes. ? Fruits: Apples. Apricots. Avocado. Berries. Bananas. Cherries. Dates. Figs. Grapes. Lemons. Melon. Oranges. Peaches. Plums. Pomegranate. ? Meats and other protein foods: Beans. Almonds. Sunflower seeds. Pine nuts. Peanuts. [place]. Salmon. Scallops. Shrimp. [place]. Tilapia. Clams. Oysters. Eggs. ? Dairy: Low-fat milk. Cheese. Greek yogurt. ? Beverages: Water. Red wine. Herbal tea. ? Fats and oils: Extra virgin olive oil. Avocado oil. Grape seed oil. ? Sweets and desserts: [place] yogurt with honey. Baked apples. Poached pears. Trail mix. ? Seasoning and other foods: Basil. Cilantro. Coriander. Cumin. Mint. Parsley. Sage. Rosemary. Tarragon. Garlic. Oregano. Thyme. Pepper. Balsalmic vinegar. Tahini. Hummus. Tomato sauce. Olives. Mushrooms. ? Limit these ? Grains: Prepackaged pasta or rice dishes. Prepackaged cereal with added sugar. ? Vegetables: Deep fried potatoes (french fries). ? Fruits: Fruit canned in syrup. ? Meats and other protein foods: Beef. Pork. Lamb. Poultry with skin. Hot dogs. [place] Salines. ? Dairy: Ice cream. Sour cream. Whole milk. ? Beverages: Juice. Sugar-sweetened soft drinks. Beer. Liquor and spirits. ? Fats and oils: Butter. Canola oil. Vegetable oil. Beef fat (tallow). Lard. ? Sweets and desserts: Cookies. Cakes. Pies. Candy. ? Seasoning and other foods: Mayonnaise. Premade sauces and marinades. ? The items listed may not be a complete list. Talk with your dietitian about what dietary choices are right for you. Summary  The Mediterranean diet includes both food and lifestyle choices.  Eat a variety of fresh fruits and vegetables, beans, nuts, seeds, and whole grains.  Limit the amount of red meat and sweets that you eat.  Talk with your health care provider about whether it is safe for you to drink red wine in moderation. This means 1 glass a day for nonpregnant women and 2 glasses a day for men. A glass of wine equals 5 oz (150 mL). This information  is not intended to replace advice given to you by your health care provider. Make sure you discuss any questions you have with your health care provider. Document Released: 10/19/2015 Document Revised: 11/21/2015 Document Reviewed: 10/19/2015 Elsevier Interactive Patient Education  2019 [place] all medications as directed. Continue to drink plenty of water and follow Mediterranean Diet. Try to walk [name] as often as you can, recommend church walking paths, [employer]. Please check your blood pressure and heart rate 1-2 times/week or if you ever feel unusual. Please follow-up in 6 months for complete physical, fasting labs the week prior. GREAT TO SEE YOU!

## 2018-03-31 NOTE — Assessment & Plan Note (Signed)
Continue all medications as directed. Continue to drink plenty of water and follow Mediterranean Diet. Try to walk Glenda Stevens as often as you can, recommend church walking paths, [employer]. Please check your blood pressure and heart rate 1-2 times/week or if you ever feel unusual. Please follow-up in 6 months for complete physical, fasting labs the week prior.

## 2018-03-31 NOTE — Assessment & Plan Note (Signed)
Stable propranolol 20mg  TID Increase regular walking

## 2018-06-03 ENCOUNTER — Encounter: Payer: Self-pay | Admitting: Adult Health

## 2018-06-03 ENCOUNTER — Other Ambulatory Visit: Payer: Self-pay

## 2018-06-03 ENCOUNTER — Telehealth: Payer: Self-pay | Admitting: Adult Health

## 2018-06-03 ENCOUNTER — Ambulatory Visit (INDEPENDENT_AMBULATORY_CARE_PROVIDER_SITE_OTHER): Payer: Medicare Other | Admitting: Adult Health

## 2018-06-03 VITALS — BP 183/97 | HR 79 | Temp 97.4°F | Ht 62.0 in | Wt 144.2 lb

## 2018-06-03 DIAGNOSIS — R8290 Unspecified abnormal findings in urine: Secondary | ICD-10-CM

## 2018-06-03 DIAGNOSIS — R42 Dizziness and giddiness: Secondary | ICD-10-CM | POA: Diagnosis not present

## 2018-06-03 DIAGNOSIS — R55 Syncope and collapse: Secondary | ICD-10-CM | POA: Insufficient documentation

## 2018-06-03 DIAGNOSIS — I10 Essential (primary) hypertension: Secondary | ICD-10-CM

## 2018-06-03 DIAGNOSIS — Z0000 Encounter for general adult medical examination without abnormal findings: Secondary | ICD-10-CM

## 2018-06-03 DIAGNOSIS — Z818 Family history of other mental and behavioral disorders: Secondary | ICD-10-CM

## 2018-06-03 DIAGNOSIS — R413 Other amnesia: Secondary | ICD-10-CM

## 2018-06-03 LAB — POCT URINALYSIS DIPSTICK
Bilirubin, UA: NEGATIVE
GLUCOSE UA: NEGATIVE
KETONES UA: NEGATIVE
Nitrite, UA: NEGATIVE
PH UA: 6.5 (ref 5.0–8.0)
Protein, UA: NEGATIVE
RBC UA: NEGATIVE
Spec Grav, UA: 1.01 (ref 1.010–1.025)
UROBILINOGEN UA: 0.2 U/dL

## 2018-06-03 MED ORDER — AMLODIPINE BESYLATE 5 MG PO TABS: 5.0000 mg | ORAL_TABLET | Freq: Every day | ORAL | 0 refills | Status: DC

## 2018-06-03 MED ORDER — TRAZODONE HCL 50 MG PO TABS: 25.0000 mg | ORAL_TABLET | Freq: Every evening | ORAL | 3 refills | Status: DC | PRN

## 2018-06-03 NOTE — Assessment & Plan Note (Signed)
BP sig above goal Amlodipine increased from 2.5mg  to 5mg  QD Check BP/HR daily- call clinic in 2 weeks with readings

## 2018-06-03 NOTE — Assessment & Plan Note (Signed)
Mother developed dementia in early 10s, she currently has quite advanced dementia  She reports decline in memory over the last few years

## 2018-06-03 NOTE — Progress Notes (Signed)
Subjective:    Patient ID: Glenda Stevens, female    DOB: 08/26/1945, 73 y.o.   MRN: [id number]  HPI:  [name] presents for recent syncopal event (had another syncopal event Oct 2019). She reports waking last night between 0000-0100, using the restroom, then going to the kitchen for something to drink. Then next thing she remembers is EMS and her son above he. She struck her head, behind R ear, bleeding was easily stopped with light pressure. Per pt- EMS checked BG, EKG and both was stable Her SBP was in 160s She is unsure how long she was unconscious  She was not taken to local ED Today she denies CP/dyspnea/palpitations/change in vision She reports soreness with palpation over R temporal bone She reports her memory continues to decline, ie "I can't remember when I placed my glasses" She denies getting lost and is able to perform ADLs, ADLs She lives with her adult son "Glenda Stevens" Her mother was dx'd in her early 22s with dementia and now is 11 with sig neurological impairment- lives in [place] Orthostatic BP today- SBP quite elevated- she is on Amlodipine 2.5mg  QD She drinks 1-2 glasses wine/night She has been using Alprazolam 0.25mg - 1/4- 1/2 tablet nightly for insomnia for years Discussed that ETOH and benzodiazipines can contribute to dizziness and syncopal events- recommend stopping Alprazolam and max 1 glass wine/night She also reports brief visual disturbance in OD a few months ago. She went to Optometrist, however when she arrived at clinic, vision had returned to normal and she did not receive dilated eye exam Patient Care Team    Relationship Specialty Notifications Start End  [name] D, NP PCP - General Family Medicine  7/0/78   Leighton Ruff, MD Consulting Physician General Surgery  10/24/14   Kyung Rudd, MD Consulting Physician Radiation Oncology  10/24/14     Patient Active Problem List   Diagnosis Date Noted  . Syncope and collapse 06/03/2018  . Abnormal  urinalysis 06/03/2018  . Dizziness 06/03/2018  . Poor memory 06/03/2018  . Family history of dementia 06/03/2018  . Healthcare maintenance 05/01/2017  . Corneal abrasion, right 10/17/2016  . Vitamin D deficiency 07/18/2016  . Other fatigue 05/09/2016  . Bilateral hip pain 05/09/2016  . Tinea corporis 05/09/2016  . Foot pain, bilateral 05/09/2016  . Medicare annual wellness visit, initial 07/06/2015  . Anxiety 07/06/2015  . Rectal cancer ([facility]) 08/01/2014  . Diarrhea 07/08/2014  . Seborrheic keratoses 03/18/2014  . Nasal congestion 09/14/2013  . Smoker 09/14/2013  . Renal artery stenosis ([facility]) 08/06/2013  . Hyperlipidemia 08/06/2013  . Essential hypertension 08/06/2013     Past Medical History:  Diagnosis Date  . Allergy    Hay fever  . Anxiety   . Arthritis   . 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 GERD (gastroesophageal reflux disease)   . History of chicken pox   . Hyperlipidemia   . Hypertension   . Phlebitis and thrombophlebitis   . PONV (postoperative nausea and vomiting)   . Renal artery stenosis ([facility])    left stent in 2003  . Renovascular hypertension   . 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 ABDOMINAL HYSTERECTOMY    . BREAST SURGERY     bilateral lumpectomy-benign  . CATARACT EXTRACTION Bilateral   . COLON RESECTION N/A 08/18/2014   Procedure: LAPAROSCOPIC ASSISTED  COLON POLYP RESECTION , DIAGNOSTIC LAPROSCOPY ;  Surgeon: Leighton Ruff, MD;  Location: [facility];  Service: General;  Laterality: N/A;  . NASAL SINUS SURGERY     patient denies having had nasal sinus surgery  . NM MYOCAR PERF WALL MOTION  09/2009   bruce myoview - normal pattern of perfusion, EF 83%, low risk scan  . RENAL ARTERY ANGIOPLASTY  07/02/2001   6x27mm Genesis on Aviator balloon stent overlapping a 6x2mm Genesist on Aviator balloon stent (Dr. Marella Chimes)  . RENAL ARTERY STENT    . RENAL  DOPPLER  09/2012   left renal artery stent - 60-99% diameter reduction  . TRANSTHORACIC ECHOCARDIOGRAM  06/2012   EF 55-60%, mild MR     Family History  Problem Relation Age of Onset  . Hypertension Mother   . Heart disease Mother   . Dementia Mother   . Hyperlipidemia Mother   . Heart disease Father   . Hyperlipidemia Father   . Cancer Father        lung cancer and prostate cancer   . Hypertension Father   . Breast cancer Cousin   . Cancer Cousin        4 paternal and 1 maternal cousins had breast cancer   . Healthy Brother   . Colon cancer Neg Hx   . Esophageal cancer Neg Hx   . Stomach cancer Neg Hx   . Rectal cancer Neg Hx      Social History   Substance and Sexual Activity  Drug Use No     Social History   Substance and Sexual Activity  Alcohol Use Yes  . Alcohol/week: 7.0 standard drinks  . Types: 7 Glasses of wine per week     Social History   Tobacco Use  Smoking Status Current Some Day Smoker  . Packs/day: 1.00  . Years: 30.00  . Pack years: 30.00  . Types: Cigarettes  Smokeless Tobacco Never Used     Outpatient Encounter Medications as of 06/03/2018  Medication Sig  . amLODipine (NORVASC) 5 MG tablet Take 1 tablet (5 mg total) by mouth daily.  Marland Kitchen aspirin EC 81 MG tablet Take 81 mg by mouth daily.  . cholecalciferol (VITAMIN D) 1000 units tablet Take 1,000 Units by mouth daily.  Marland Kitchen ketoconazole (NIZORAL) 2 % shampoo APPLY 1 APPLICATION TOPICALLY 2 (TWO) TIMES A WEEK.  Marland Kitchen loperamide (IMODIUM) 2 MG capsule States taking 1/2 tab daily (Patient taking differently: States taking 1/2 tab daily PRN)  . Multiple Vitamin (MULTIVITAMIN) tablet Take 1 tablet by mouth daily.  . Omega-3 Fatty Acids (FISH OIL PO) Take 1 tablet by mouth daily.  . propranolol (INDERAL) 20 MG tablet Take 1 tablet (20 mg total) by mouth 3 (three) times daily.  . ranitidine (ZANTAC) 150 MG tablet Take 150 mg by mouth daily as needed for heartburn.  . sertraline (ZOLOFT) 100 MG  tablet Take 100 mg by mouth daily.   . [DISCONTINUED] ALPRAZolam (XANAX) 0.25 MG tablet Take 0.125 mg by mouth daily as needed for anxiety.  . [DISCONTINUED] amLODipine (NORVASC) 2.5 MG tablet Take 1 tablet (2.5 mg total) by mouth daily.  . traZODone (DESYREL) 50 MG tablet Take 0.5-1 tablets (25-50 mg total) by mouth at bedtime as needed for sleep.   Facility-Administered Encounter Medications as of 06/03/2018  Medication  . 0.9 %  sodium chloride infusion    Allergies: Patient has no known allergies.  Body mass index is 26.37 kg/m.  Blood pressure (!) 183/97, pulse 79, temperature (!) 97.4 F (36.3 C),  temperature source Oral, height 5\' 2"  (1.575 m), weight 144 lb 3.2 oz (65.4 kg), SpO2 100 %.  Review of Systems  Constitutional: Positive for fatigue. Negative for activity change, appetite change, chills, diaphoresis, fever and unexpected weight change.  HENT: Negative for congestion.   Eyes: Positive for visual disturbance.  Respiratory: Negative for cough, chest tightness, shortness of breath, wheezing and stridor.   Cardiovascular: Negative for chest pain, palpitations and leg swelling.  Gastrointestinal: Negative for abdominal distention, abdominal pain, blood in stool, constipation, diarrhea, nausea and vomiting.  Endocrine: Negative for cold intolerance, heat intolerance, polydipsia, polyphagia and polyuria.  Genitourinary: Negative for difficulty urinating, dysuria and flank pain.  Musculoskeletal: Positive for arthralgias and myalgias. Negative for back pain, gait problem, joint swelling, neck pain and neck stiffness.  Skin: Positive for wound. Negative for color change, pallor and rash.  Neurological: Positive for dizziness and syncope. Negative for tremors, seizures, speech difficulty, weakness, numbness and headaches.       Poor memory  Hematological: Does not bruise/bleed easily.  Psychiatric/Behavioral: Positive for dysphoric mood and sleep disturbance. Negative for  agitation, behavioral problems, confusion, decreased concentration, hallucinations, self-injury and suicidal ideas. The patient is not nervous/anxious and is not hyperactive.        Objective:   Physical Exam Vitals signs and nursing note reviewed.  Constitutional:      General: She is not in acute distress.    Appearance: She is not ill-appearing, toxic-appearing or diaphoretic.  HENT:     Head: Normocephalic. No right periorbital erythema.      Comments: One mobile, circular hematoma noted R temporal area No open tissue/drainagte noted Mildly TTP     Right Ear: Decreased hearing noted. Tympanic membrane is not perforated or bulging.     Left Ear: Decreased hearing noted. Tympanic membrane is not perforated or bulging.     Nose: No congestion or rhinorrhea.     Mouth/Throat:     Dentition: Abnormal dentition. Dental caries present.     Pharynx: No posterior oropharyngeal erythema.  Eyes:     Extraocular Movements: Extraocular movements intact.     Conjunctiva/sclera: Conjunctivae normal.     Pupils: Pupils are equal, round, and reactive to light.  Neck:     Musculoskeletal: Normal range of motion.  Cardiovascular:     Rate and Rhythm: Normal rate.     Pulses: Normal pulses.     Heart sounds: Normal heart sounds. No murmur. No friction rub. No gallop.   Pulmonary:     Effort: Pulmonary effort is normal. No respiratory distress.     Breath sounds: Normal breath sounds. No stridor. No wheezing, rhonchi or rales.  Chest:     Chest wall: No tenderness.  Skin:    General: Skin is warm and dry.     Capillary Refill: Capillary refill takes less than 2 seconds.  Neurological:     Mental Status: She is alert and oriented to person, place, and time.     Cranial Nerves: No cranial nerve deficit.     Sensory: No sensory deficit.     Motor: No weakness.     Coordination: Coordination normal.     Gait: Gait normal.     Deep Tendon Reflexes: Reflexes normal.  Psychiatric:         Mood and Affect: Mood normal.        Behavior: Behavior normal.        Thought Content: Thought content normal.  Judgment: Judgment normal.       Assessment & Plan:   1. Syncope and collapse   2. Abnormal urinalysis   3. Essential hypertension   4. Dizziness   5. Poor memory   6. Family history of dementia   7. Healthcare maintenance     Healthcare maintenance 1) Urinalysis is normal. We will call you when labs and urine culture/sensitivity results are available. 2) increase amlodipine from 2.5mg  to 5mg  once daily. Record blood pressure and heart rate daily- either send MyChart message or call clinic in 2 weeks with readings. 3) Urgent referral to Neurology placed, re: dizziness, recent syncopal events (passing out), decline in memory. 4) increase water intake and decrease wine intake- max one glass/night 5) stop alprazolam (xanax), start Trazodone 50mg - 1/2 to 1 tablet nightly as needed for insomnia. Please call clinic with questions/concerns.  Abnormal urinalysis 1) Urinalysis- Leu: small We will call you when labs and urine culture/sensitivity results are available.  Dizziness Increase water Reduce ETOH, stop Alprazolam Referral to Neurology placed  Family history of dementia Mother developed dementia in early 45s, she currently has quite advanced dementia  She reports decline in memory over the last few years  Syncope and collapse 1) Urinalysis is normal. We will call you when labs and urine culture/sensitivity results are available. 2) increase amlodipine from 2.5mg  to 5mg  once daily. Record blood pressure and heart rate daily- either send MyChart message or call clinic in 2 weeks with readings. 3) Urgent referral to Neurology placed, re: dizziness, recent syncopal events (passing out), decline in memory. 4) increase water intake and decrease wine intake- max one glass/night 5) stop alprazolam (xanax), start Trazodone 50mg - 1/2 to 1 tablet nightly as needed  for insomnia. Please call clinic with questions/concerns.  Essential hypertension BP sig above goal Amlodipine increased from 2.5mg  to 5mg  QD Check BP/HR daily- call clinic in 2 weeks with readings    FOLLOW-UP:  Return if symptoms worsen or fail to improve.

## 2018-06-03 NOTE — Assessment & Plan Note (Signed)
1) Urinalysis is normal. We will call you when labs and urine culture/sensitivity results are available. 2) increase amlodipine from 2.5mg  to 5mg  once daily. Record blood pressure and heart rate daily- either send MyChart message or call clinic in 2 weeks with readings. 3) Urgent referral to Neurology placed, re: dizziness, recent syncopal events (passing out), decline in memory. 4) increase water intake and decrease wine intake- max one glass/night 5) stop alprazolam (xanax), start Trazodone 50mg - 1/2 to 1 tablet nightly as needed for insomnia. Please call clinic with questions/concerns.

## 2018-06-03 NOTE — Patient Instructions (Signed)
Syncope Syncope is when you pass out (faint) for a short time. It is caused by a sudden decrease in blood flow to the brain. Signs that you may be about to pass out include:  Feeling dizzy or light-headed.  Feeling sick to your stomach (nauseous).  Seeing all white or all black.  Having cold, clammy skin. If you pass out, get help right away. Call your local emergency services (911 in the [place] Do not drive yourself to the hospital. Follow these instructions at home: Watch for any changes in your symptoms. Take these actions to stay safe and help with your symptoms: Lifestyle  Do not drive, use machinery, or play sports until your doctor says it is okay.  Do not drink alcohol.  Do not use any products that contain nicotine or tobacco, such as cigarettes and e-cigarettes. If you need help quitting, ask your doctor.  Drink enough fluid to keep your pee (urine) pale yellow. General instructions  Take over-the-counter and prescription medicines only as told by your doctor.  If you are taking blood pressure or heart medicine, sit up and stand up slowly. Spend a few minutes getting ready to sit and then stand. This can help you feel less dizzy.  Have someone stay with you until you feel stable.  If you start to feel like you might pass out, lie down right away and raise (elevate) your feet above the level of your heart. Breathe deeply and steadily. Wait until all of the symptoms are gone.  Keep all follow-up visits as told by your doctor. This is important. Get help right away if:  You have a very bad headache.  You pass out once or more than once.  You have pain in your chest, belly, or back.  You have a very fast or uneven heartbeat (palpitations).  It hurts to breathe.  You are bleeding from your mouth or your bottom (rectum).  You have black or tarry poop (stool).  You have jerky movements that you cannot control (seizure).  You are confused.  You have trouble  walking.  You are very weak.  You have vision problems. These symptoms may be an emergency. Do not wait to see if the symptoms will go away. Get medical help right away. Call your local emergency services (911 in the [place] Do not drive yourself to the hospital. Summary  Syncope is when you pass out (faint) for a short time. It is caused by a sudden decrease in blood flow to the brain.  Signs that you may be about to faint include feeling dizzy, light-headed, or sick to your stomach, seeing all white or all black, or having cold, clammy skin.  If you start to feel like you might pass out, lie down right away and raise (elevate) your feet above the level of your heart. Breathe deeply and steadily. Wait until all of the symptoms are gone. This information is not intended to replace advice given to you by your health care provider. Make sure you discuss any questions you have with your health care provider. Document Released: 08/14/2007 Document Revised: 04/09/2017 Document Reviewed: 04/09/2017 Elsevier Interactive Patient Education  2019 [employer].   1) Urinalysis is normal. We will call you when labs and urine culture/sensitivity results are available. 2) increase amlodipine from 2.5mg  to 5mg  once daily. Record blood pressure and heart rate daily- either send MyChart message or call clinic in 2 weeks with readings. 3) Urgent referral to Neurology placed, re: dizziness, recent syncopal  events (passing out), decline in memory. 4) increase water intake and decrease wine intake- max one glass/night 5) stop alprazolam (xanax), start Trazodone 50mg - 1/2 to 1 tablet nightly as needed for insomnia. Please call clinic with questions/concerns. PLEASE STAY HOME DURING COVID-19 PANDEMIC NICE TO SEE YOU!

## 2018-06-03 NOTE — Assessment & Plan Note (Signed)
1) Urinalysis- Leu: small We will call you when labs and urine culture/sensitivity results are available.

## 2018-06-03 NOTE — Assessment & Plan Note (Signed)
Increase water Reduce ETOH, stop Alprazolam Referral to Neurology placed

## 2018-06-04 ENCOUNTER — Telehealth: Payer: Self-pay | Admitting: Neurology

## 2018-06-04 LAB — COMPREHENSIVE METABOLIC PANEL
ALBUMIN: 4.5 g/dL (ref 3.7–4.7)
ALK PHOS: 62 IU/L (ref 39–117)
ALT: 16 IU/L (ref 0–32)
AST: 21 IU/L (ref 0–40)
Albumin/Globulin Ratio: 2 (ref 1.2–2.2)
BUN / CREAT RATIO: 13 (ref 12–28)
BUN: 9 mg/dL (ref 8–27)
Bilirubin Total: 0.3 mg/dL (ref 0.0–1.2)
CO2: 23 mmol/L (ref 20–29)
CREATININE: 0.68 mg/dL (ref 0.57–1.00)
Calcium: 9.7 mg/dL (ref 8.7–10.3)
Chloride: 96 mmol/L (ref 96–106)
GFR, EST AFRICAN AMERICAN: 100 mL/min/{1.73_m2} (ref >59)
GFR, EST NON AFRICAN AMERICAN: 86 mL/min/{1.73_m2} (ref >59)
GLOBULIN, TOTAL: 2.2 g/dL (ref 1.5–4.5)
Glucose: 112 mg/dL — ABNORMAL HIGH (ref 65–99)
Potassium: 4.3 mmol/L (ref 3.5–5.2)
SODIUM: 136 mmol/L (ref 134–144)
TOTAL PROTEIN: 6.7 g/dL (ref 6.0–8.5)

## 2018-06-04 LAB — HEMOGLOBIN A1C
Est. average glucose Bld gHb Est-mCnc: 105 mg/dL
HEMOGLOBIN A1C: 5.3 % (ref 4.8–5.6)

## 2018-06-04 NOTE — Telephone Encounter (Signed)
Given the constellation of symptoms with dizziness and syncope, will try to see the patient face-to-face in the next week or 2.

## 2018-06-04 NOTE — Telephone Encounter (Signed)
Dr. Jannifer Franklin, we received an internal referral on this patient marked urgent. It's for dizziness, syncope, memory decline and family hx of dementia. Could you review? Can pt wait until June?

## 2018-06-05 LAB — CULTURE, URINE COMPREHENSIVE

## 2018-06-06 ENCOUNTER — Other Ambulatory Visit: Payer: Self-pay | Admitting: Adult Health

## 2018-06-06 MED ORDER — NITROFURANTOIN MONOHYD MACRO 100 MG PO CAPS: 100.0000 mg | ORAL_CAPSULE | Freq: Two times a day (BID) | ORAL | 0 refills | Status: DC

## 2018-06-06 NOTE — Progress Notes (Signed)
Nitrofurantoin 1--mg BID, x 7 days Last CMP- stable, GFR >60 Sent into [facility]

## 2018-06-29 ENCOUNTER — Other Ambulatory Visit: Payer: Self-pay

## 2018-06-29 MED ORDER — PROPRANOLOL HCL 20 MG PO TABS: 20.0000 mg | ORAL_TABLET | Freq: Three times a day (TID) | ORAL | 0 refills | Status: DC

## 2018-07-16 DIAGNOSIS — R739 Hyperglycemia, unspecified: Secondary | ICD-10-CM | POA: Insufficient documentation

## 2018-07-28 ENCOUNTER — Other Ambulatory Visit: Payer: Self-pay | Admitting: Adult Health

## 2018-08-01 ENCOUNTER — Other Ambulatory Visit: Payer: Self-pay | Admitting: Adult Health

## 2018-08-05 ENCOUNTER — Other Ambulatory Visit: Payer: Self-pay

## 2018-08-13 ENCOUNTER — Other Ambulatory Visit: Payer: Self-pay | Admitting: Adult Health

## 2018-08-17 ENCOUNTER — Other Ambulatory Visit: Payer: Self-pay | Admitting: Adult Health

## 2018-09-04 ENCOUNTER — Telehealth: Payer: Self-pay | Admitting: Hematology

## 2018-09-04 NOTE — Telephone Encounter (Signed)
[facility] moved 7/6 appointments to 7/23. Left message. Schedule mailed.

## 2018-09-14 ENCOUNTER — Ambulatory Visit: Payer: Self-pay | Admitting: Hematology

## 2018-09-14 ENCOUNTER — Other Ambulatory Visit: Payer: Self-pay

## 2018-09-16 ENCOUNTER — Other Ambulatory Visit: Payer: Self-pay | Admitting: Adult Health

## 2018-09-17 MED ORDER — PROPRANOLOL HCL 20 MG PO TABS: 20.0000 mg | ORAL_TABLET | Freq: Three times a day (TID) | ORAL | 0 refills | Status: DC

## 2018-09-22 ENCOUNTER — Other Ambulatory Visit: Payer: Medicare Other

## 2018-09-22 ENCOUNTER — Other Ambulatory Visit: Payer: Self-pay

## 2018-09-22 DIAGNOSIS — Z0000 Encounter for general adult medical examination without abnormal findings: Secondary | ICD-10-CM

## 2018-09-22 DIAGNOSIS — E785 Hyperlipidemia, unspecified: Secondary | ICD-10-CM

## 2018-09-22 DIAGNOSIS — R739 Hyperglycemia, unspecified: Secondary | ICD-10-CM

## 2018-09-22 DIAGNOSIS — I10 Essential (primary) hypertension: Secondary | ICD-10-CM

## 2018-09-23 LAB — COMPREHENSIVE METABOLIC PANEL
ALT: 13 IU/L (ref 0–32)
AST: 18 IU/L (ref 0–40)
Albumin/Globulin Ratio: 2 (ref 1.2–2.2)
Albumin: 4 g/dL (ref 3.7–4.7)
Alkaline Phosphatase: 53 IU/L (ref 39–117)
BUN/Creatinine Ratio: 13 (ref 12–28)
BUN: 10 mg/dL (ref 8–27)
Bilirubin Total: 0.3 mg/dL (ref 0.0–1.2)
CO2: 24 mmol/L (ref 20–29)
Calcium: 9.1 mg/dL (ref 8.7–10.3)
Chloride: 100 mmol/L (ref 96–106)
Creatinine, Ser: 0.76 mg/dL (ref 0.57–1.00)
GFR calc Af Amer: 89 mL/min/{1.73_m2} (ref >59)
GFR calc non Af Amer: 78 mL/min/{1.73_m2} (ref >59)
Globulin, Total: 2 g/dL (ref 1.5–4.5)
Glucose: 97 mg/dL (ref 65–99)
Potassium: 4.7 mmol/L (ref 3.5–5.2)
Sodium: 138 mmol/L (ref 134–144)
Total Protein: 6 g/dL (ref 6.0–8.5)

## 2018-09-23 LAB — CBC WITH DIFFERENTIAL/PLATELET
Basophils Absolute: 0 10*3/uL (ref 0.0–0.2)
Basos: 1 %
EOS (ABSOLUTE): 0.2 10*3/uL (ref 0.0–0.4)
Eos: 3 %
Hematocrit: 40.5 % (ref 34.0–46.6)
Hemoglobin: 13.5 g/dL (ref 11.1–15.9)
Immature Grans (Abs): 0 10*3/uL (ref 0.0–0.1)
Immature Granulocytes: 0 %
Lymphocytes Absolute: 0.9 10*3/uL (ref 0.7–3.1)
Lymphs: 17 %
MCH: 31 pg (ref 26.6–33.0)
MCHC: 33.3 g/dL (ref 31.5–35.7)
MCV: 93 fL (ref 79–97)
Monocytes Absolute: 0.7 10*3/uL (ref 0.1–0.9)
Monocytes: 12 %
Neutrophils Absolute: 3.5 10*3/uL (ref 1.4–7.0)
Neutrophils: 67 %
Platelets: 223 10*3/uL (ref 150–450)
RBC: 4.36 x10E6/uL (ref 3.77–5.28)
RDW: 12.6 % (ref 11.7–15.4)
WBC: 5.2 10*3/uL (ref 3.4–10.8)

## 2018-09-23 LAB — LIPID PANEL
Chol/HDL Ratio: 5.5 ratio — ABNORMAL HIGH (ref 0.0–4.4)
Cholesterol, Total: 311 mg/dL — ABNORMAL HIGH (ref 100–199)
HDL: 57 mg/dL (ref >39)
LDL Calculated: 225 mg/dL — ABNORMAL HIGH (ref 0–99)
Triglycerides: 145 mg/dL (ref 0–149)
VLDL Cholesterol Cal: 29 mg/dL (ref 5–40)

## 2018-09-23 LAB — TSH: TSH: 4.58 u[IU]/mL — ABNORMAL HIGH (ref 0.450–4.500)

## 2018-09-23 LAB — HEMOGLOBIN A1C
Est. average glucose Bld gHb Est-mCnc: 111 mg/dL
Hgb A1c MFr Bld: 5.5 % (ref 4.8–5.6)

## 2018-09-25 NOTE — Progress Notes (Signed)
[facility]   [phone] Fax:([phone]) [phone]   Clinic Follow up Note   Patient Care Team: Esaw Grandchild, NP as PCP - General (Family Medicine) Leighton Ruff, MD as Consulting Physician (General Surgery) Kyung Rudd, MD as Consulting Physician (Radiation Oncology) Milus Banister, MD as Attending Physician (Gastroenterology)  Date of Service:  10/01/2018  CHIEF COMPLAINT: Follow up rectal squamous cell carcinoma  SUMMARY OF ONCOLOGIC HISTORY: Oncology History Overview Note  Presented with heme + stool, loose stools for 2 months   Rectal cancer ([place])  08/01/2014 Pathology Results   Rectum, biopsy, distal mass - SQUAMOUS CELL CARCINOMA, BASALOID TYPE   08/01/2014 Procedure   Colonoscopy-ulcerated, malignant appearing distal rectal mass adjacent to the internal anal sphincter. #14 polyps spread throughout the colon (#12 removed)   08/01/2014 Tumor Marker   CEA=5.3   08/01/2014 Initial Diagnosis   Rectal cancer   08/04/2014 Imaging   CT C/A/P-No evidence of metastatic disease in chest, abdomen, or pelvis   08/18/2014 Procedure   Colonoscopy and parotidectomy. Path reviewed tubular adenoma.   08/25/2014 Imaging   PET IMPRESSION: 1. Hypermetabolic activity at the anus corresponding with known squamous cell carcinoma. No evidence of metastatic disease. 2. No other suspicious metabolic activity. 3. Diffuse atherosclerosis.   09/05/2014 - 10/19/2014 Radiation Therapy   definitive XRT to 50.4 Gy with concurrent chemotherapy   09/05/2015 - 10/07/2015 Chemotherapy   Mitomycin 10mg /m2 on Day 1 and 28, 5-fu 1000mg /m2 on day 1-5  and day 28-32 (96 hours)   11/10/2015 Procedure   Colonoscopy 11/10/2015 - Three 4 to 5 mm polyps in the ascending colon, removed with a hot snare. Resected and retrieved. - One 4 mm polyp in the transverse colon, removed with a cold snare. Resected and retrieved. - The examination was otherwise normal on direct and retroflexion views.     11/10/2015 Pathology Results   Diagnosis 11/10/2015 Surgical [P], ascendingx3, transversex1, polyp (4) - TUBULAR ADENOMA (X3 FRAGMENTS). - BENIGN COLONIC MUCOSA (MULTIPLE FRAGMENTS). - NO HIGH GRADE DYSPLASIA OR MALIGNANCY.   03/06/2016 PET scan   PET 03/06/2016 IMPRESSION: Negative PET scan for residual, recurrent or metastatic disease.   03/07/2017 PET scan   PET 03/07/17 IMPRESSION: No evidence of colorectal carcinoma local recurrence or metastasis. Stable exam.      CURRENT THERAPY:  Surveillance    INTERVAL HISTORY:  Glenda Stevens is here for a follow up rectal cancer. She presents to the clinic alone. She notes she is doing well. She note having diarrhea once a day but at sporadic times leading to stool incontinence. She will take imodium as needed. She denies any new changes. She is due for breast exam next month. She is not sure when to f/u with Dr. Marcello Moores.     REVIEW OF SYSTEMS:   Constitutional: Denies fevers, chills or abnormal weight loss Eyes: Denies blurriness of vision Ears, nose, mouth, throat, and face: Denies mucositis or sore throat Respiratory: Denies cough, dyspnea or wheezes Cardiovascular: Denies palpitation, chest discomfort or lower extremity swelling Gastrointestinal:  Denies nausea, heartburn (+) once daily diarrhea/stool incontinence  Skin: Denies abnormal skin rashes Lymphatics: Denies new lymphadenopathy or easy bruising Neurological:Denies numbness, tingling or new weaknesses Behavioral/Psych: Mood is stable, no new changes  All other systems were reviewed with the patient and are negative.  MEDICAL HISTORY:  Past Medical History:  Diagnosis Date  . Allergy    Hay fever  . Anxiety   . Arthritis   . Colon cancer ([place]) 08/01/14  rectal squamous cell ca  . GERD (gastroesophageal reflux disease)   . History of chicken pox   . Hyperlipidemia   . Hypertension   . Phlebitis and thrombophlebitis   . PONV (postoperative nausea and vomiting)   .  Renal artery stenosis ([facility])    left stent in 2003  . Renovascular hypertension   . S/P radiation therapy 09/05/14-10/19/14   anal ca,50.4Gy    SURGICAL HISTORY: Past Surgical History:  Procedure Laterality Date  . ABDOMINAL HYSTERECTOMY    . BREAST SURGERY     bilateral lumpectomy-benign  . CATARACT EXTRACTION Bilateral   . COLON RESECTION N/A 08/18/2014   Procedure: LAPAROSCOPIC ASSISTED  COLON POLYP RESECTION , DIAGNOSTIC LAPROSCOPY ;  Surgeon: Leighton Ruff, MD;  Location: [facility];  Service: General;  Laterality: N/A;  . NASAL SINUS SURGERY     patient denies having had nasal sinus surgery  . NM MYOCAR PERF WALL MOTION  09/2009   bruce myoview - normal pattern of perfusion, EF 83%, low risk scan  . RENAL ARTERY ANGIOPLASTY  07/02/2001   6x74mm Genesis on Aviator balloon stent overlapping a 6x37mm Genesist on Aviator balloon stent (Dr. Marella Chimes)  . RENAL ARTERY STENT    . RENAL DOPPLER  09/2012   left renal artery stent - 60-99% diameter reduction  . TRANSTHORACIC ECHOCARDIOGRAM  06/2012   EF 55-60%, mild MR    I have reviewed the social history and family history with the patient and they are unchanged from previous note.  ALLERGIES:  has No Known Allergies.  MEDICATIONS:  Current Outpatient Medications  Medication Sig Dispense Refill  . amLODipine (NORVASC) 5 MG tablet TAKE 1 TABLET BY MOUTH  DAILY 90 tablet 0  . aspirin EC 81 MG tablet Take 81 mg by mouth daily.    Marland Kitchen atorvastatin (LIPITOR) 20 MG tablet Take 1 tablet (20 mg total) by mouth daily. 90 tablet 1  . cholecalciferol (VITAMIN D) 1000 units tablet Take 1,000 Units by mouth daily.    Marland Kitchen ketoconazole (NIZORAL) 2 % shampoo APPLY 1 APPLICATION TOPICALLY 2 (TWO) TIMES A WEEK. 120 mL 1  . Multiple Vitamin (MULTIVITAMIN) tablet Take 1 tablet by mouth daily.    . propranolol (INDERAL) 20 MG tablet Take 1 tablet (20 mg total) by mouth 3 (three) times daily. 270 tablet 0  . ranitidine (ZANTAC) 150 MG tablet Take 150 mg by  mouth daily as needed for heartburn.    . sertraline (ZOLOFT) 100 MG tablet Take 100 mg by mouth daily.      Current Facility-Administered Medications  Medication Dose Route Frequency Provider Last Rate Last Dose  . 0.9 %  sodium chloride infusion  500 mL Intravenous Continuous Milus Banister, MD        PHYSICAL EXAMINATION: ECOG PERFORMANCE STATUS: 0 - Asymptomatic  Vitals:   10/01/18 1335  BP: (!) 158/75  Pulse: 70  Resp: 18  Temp: 97.7 F (36.5 C)  SpO2: 100%   Filed Weights   10/01/18 1335  Weight: 143 lb (64.9 kg)    GENERAL:alert, no distress and comfortable SKIN: skin color, texture, turgor are normal, no rashes or significant lesions EYES: normal, Conjunctiva are pink and non-injected, sclera clear  NECK: supple, thyroid normal size, non-tender, without nodularity LYMPH:  no palpable lymphadenopathy in the cervical, axillary  LUNGS: clear to auscultation and percussion with normal breathing effort HEART: regular rate & rhythm and no murmurs and no lower extremity edema ABDOMEN:abdomen soft, non-tender and normal bowel sounds Musculoskeletal:no  cyanosis of digits and no clubbing  NEURO: alert & oriented x 3 with fluent speech, no focal motor/sensory deficits RECTAL: (+) mild extremal hemorrhoids. No palpable mass. No blood on glove. Normal stool present. Benign exam   LABORATORY DATA:  I have reviewed the data as listed CBC Latest Ref Rng & Units 10/01/2018 09/22/2018 12/12/2017  WBC 4.0 - 10.5 K/uL 7.0 5.2 5.9  Hemoglobin 12.0 - 15.0 g/dL 13.7 13.5 13.9  Hematocrit 36.0 - 46.0 % 41.4 40.5 42.0  Platelets 150 - 400 K/uL 215 223 168     CMP Latest Ref Rng & Units 10/01/2018 09/22/2018 06/03/2018  Glucose 70 - 99 mg/dL 75 97 112(H)  BUN 8 - 23 mg/dL 12 10 9   Creatinine 0.44 - 1.00 mg/dL 1.00 0.76 0.68  Sodium 135 - 145 mmol/L 137 138 136  Potassium 3.5 - 5.1 mmol/L 4.1 4.7 4.3  Chloride 98 - 111 mmol/L 103 100 96  CO2 22 - 32 mmol/L 28 24 23   Calcium 8.9 - 10.3  mg/dL 9.2 9.1 9.7  Total Protein 6.5 - 8.1 g/dL 6.9 6.0 6.7  Total Bilirubin 0.3 - 1.2 mg/dL 0.3 0.3 0.3  Alkaline Phos 38 - 126 U/L 55 53 62  AST 15 - 41 U/L 19 18 21   ALT 0 - 44 U/L 13 13 16       RADIOGRAPHIC STUDIES: I have personally reviewed the radiological images as listed and agreed with the findings in the report. No results found.   ASSESSMENT & PLAN:  Glenda Stevens is a 73 y.o. female with   1. Rectal squamous cell carcinoma, cT2N0M0, stage I  -She was diagnosed in 07/2014. She is s/p concurrent ChemoRT with goal of treatment as curative. Repeat colonoscopy in 11/2015 and subsequent PET scans have been NED.  -11/2015 Colonoscopy with Dr. Ardis Hughs 4 benign polyps removed. She is due again in Sep 2020. She will call and set up.  -She is clinically doing well and stable. Labs reviewed, CBC and CMP WNL. Rectal exam unremarkable during this encounter. There is no clinical concern for recurrence.  -She is 4 years since her diagnosis. Her risk of recurrence has decreased significantly. Will f/u with her in 1 year for the last time.    2. Smoking cessation -I strongly encouraged her to stop smoking, she is willing to cut back.  3. Hypertension, anxiety -She'll continue follow-up with her primary care physician  4. Loose stools, incontinence -Managed with imodium, mild and intermittent. This leads to stool incontinence and accidents.  -I recommend she do PT for pelvic floor strengthening. She is interested. Will send referral    PLAN -Send pelvic PT referral  -She is clinically doing well and stable.  -Colonoscopy with Dr. Ardis Hughs in late 2020  -f/u in 1 year    No problem-specific Assessment & Plan notes found for this encounter.   Orders Placed This Encounter  Procedures  . Ambulatory referral to Physical Therapy    Referral Priority:   Routine    Referral Type:   Physical Medicine    Referral Reason:   Specialty Services Required    Requested Specialty:   Physical  Therapy    Number of Visits Requested:   1   All questions were answered. The patient knows to call the clinic with any problems, questions or concerns. No barriers to learning was detected. I spent 15 minutes counseling the patient face to face. The total time spent in the appointment was 20 minutes and more than  50% was on counseling and review of test results     Truitt Merle, MD 10/01/2018   I, Joslyn Devon, am acting as scribe for Truitt Merle, MD.   I have reviewed the above documentation for accuracy and completeness, and I agree with the above.

## 2018-09-28 NOTE — Progress Notes (Signed)
Subjective:    Patient ID: Glenda Stevens, female    DOB: [birth year], 73 y.o.   MRN: [id number]  HPI:  [name] is here for CPE She reports medication compliance, denies SE She continues to walk her dig "[name]" daily and her son has been making her heart healthy  7/14/2TSH- slightly elevated, however Free T4 and T3 normal- subclinical hypothyroidism  She reports ambulatory BP readings- SBP 120-130s DBP 70s Continues to smoke pack/day- declined smoking cessation  A1c-WNL, 5.5  CMP-stable  CBC-stable  The 10-year ASCVD risk score Mikey Bussing DC Jr., et al., 2013) is: 50%  Values used to calculate the score:   Age: 73 years   Sex: Female   Is Non-Hispanic African American: No   Diabetic: No   Tobacco smoker: Yes   Systolic Blood Pressure: 269 mmHg   Is BP treated: Yes   HDL Cholesterol: 57 mg/dL   Total Cholesterol: 311 mg/dL  LDL-225, large jump from 192 one year ago- agreeable to statin therapy  Healthcare Maintenance: PAP-UTD Mammogram-will shcedule Colonoscopy-Due 11/2018 Immunizations-UTD LDCT- Ordered  Patient Care Team    Relationship Specialty Notifications Start End  [name] D, NP PCP - General Family Medicine  06/[birth year]   Leighton Ruff, [place] Physician General Surgery  10/24/14   Kyung Rudd, MD Consulting Physician Radiation Oncology  10/24/14     Patient Active Problem List   Diagnosis Date Noted  . Blood glucose elevated 07/16/2018  . Syncope and collapse 06/03/2018  . Abnormal urinalysis 06/03/2018  . Dizziness 06/03/2018  . Poor memory 06/03/2018  . Family history of dementia 06/03/2018  . Healthcare maintenance 05/01/2017  . Corneal abrasion, right 10/17/2016  . Vitamin D deficiency 07/18/2016  . Other fatigue 05/09/2016  . Bilateral hip pain 05/09/2016  . Tinea corporis 05/09/2016  . Foot pain, bilateral 05/09/2016  . Medicare annual wellness visit, initial 07/06/2015  . Anxiety 07/06/2015  . Rectal cancer ([place])  08/01/2014  . Diarrhea 07/08/2014  . Seborrheic keratoses 03/18/2014  . Nasal congestion 09/14/2013  . Smoker 09/14/2013  . Renal artery stenosis ([place]) 08/06/2013  . Hyperlipidemia 08/06/2013  . Essential hypertension 08/06/2013     Past Medical History:  Diagnosis Date  . Allergy    Hay fever  . Anxiety   . Arthritis   . Colon cancer ([place]) 08/01/14   rectal squamous cell ca  . GERD (gastroesophageal reflux disease)   . History of chicken pox   . Hyperlipidemia   . Hypertension   . Phlebitis and thrombophlebitis   . PONV (postoperative nausea and vomiting)   . Renal artery stenosis ([facility])    left stent in 2003  . Renovascular hypertension   . 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 ABDOMINAL HYSTERECTOMY    . BREAST SURGERY     bilateral lumpectomy-benign  . CATARACT EXTRACTION Bilateral   . COLON RESECTION N/A 08/18/2014   Procedure: LAPAROSCOPIC ASSISTED  COLON POLYP RESECTION , DIAGNOSTIC LAPROSCOPY ;  Surgeon: Leighton Ruff, MD;  Location: [facility];  Service: General;  Laterality: N/A;  . NASAL SINUS SURGERY     patient denies having had nasal sinus surgery  . NM MYOCAR PERF WALL MOTION  09/2009   bruce myoview - normal pattern of perfusion, EF 83%, low risk scan  . RENAL ARTERY ANGIOPLASTY  07/02/2001   6x67mm Genesis on Aviator balloon stent overlapping a 6x71mm Genesist on Aviator balloon stent (Dr.  R. Rollene Fare)  . RENAL ARTERY STENT    . RENAL DOPPLER  09/2012   left renal artery stent - 60-99% diameter reduction  . TRANSTHORACIC ECHOCARDIOGRAM  06/2012   EF 55-60%, mild MR     Family History  Problem Relation Age of Onset  . Hypertension Mother   . Heart disease Mother   . Dementia Mother   . Hyperlipidemia Mother   . Heart disease Father   . Hyperlipidemia Father   . Cancer Father        lung cancer and prostate cancer   . Hypertension Father   . Breast cancer Cousin   . Cancer  Cousin        4 paternal and 1 maternal cousins had breast cancer   . Healthy Brother   . Colon cancer Neg Hx   . Esophageal cancer Neg Hx   . Stomach cancer Neg Hx   . Rectal cancer Neg Hx      Social History   Substance and Sexual Activity  Drug Use No     Social History   Substance and Sexual Activity  Alcohol Use Yes  . Alcohol/week: 7.0 standard drinks  . Types: 7 Glasses of wine per week     Social History   Tobacco Use  Smoking Status Current Some Day Smoker  . Packs/day: 1.00  . Years: 30.00  . Pack years: 30.00  . Types: Cigarettes  Smokeless Tobacco Never Used     Outpatient Encounter Medications as of 09/29/2018  Medication Sig  . amLODipine (NORVASC) 5 MG tablet TAKE 1 TABLET BY MOUTH  DAILY  . aspirin EC 81 MG tablet Take 81 mg by mouth daily.  . cholecalciferol (VITAMIN D) 1000 units tablet Take 1,000 Units by mouth daily.  Marland Kitchen ketoconazole (NIZORAL) 2 % shampoo APPLY 1 APPLICATION TOPICALLY 2 (TWO) TIMES A WEEK.  Marland Kitchen loperamide (IMODIUM) 2 MG capsule States taking 1/2 tab daily (Patient taking differently: States taking 1/2 tab daily PRN)  . Multiple Vitamin (MULTIVITAMIN) tablet Take 1 tablet by mouth daily.  . propranolol (INDERAL) 20 MG tablet Take 1 tablet (20 mg total) by mouth 3 (three) times daily.  . ranitidine (ZANTAC) 150 MG tablet Take 150 mg by mouth daily as needed for heartburn.  . sertraline (ZOLOFT) 100 MG tablet Take 100 mg by mouth daily.   Marland Kitchen atorvastatin (LIPITOR) 20 MG tablet Take 1 tablet (20 mg total) by mouth daily.  . [DISCONTINUED] nitrofurantoin, macrocrystal-monohydrate, (MACROBID) 100 MG capsule Take 1 capsule (100 mg total) by mouth 2 (two) times daily.  . [DISCONTINUED] Omega-3 Fatty Acids (FISH OIL PO) Take 1 tablet by mouth daily.  . [DISCONTINUED] traZODone (DESYREL) 50 MG tablet Take 0.5-1 tablets (25-50 mg total) by mouth at bedtime as needed for sleep.   Facility-Administered Encounter Medications as of 09/29/2018   Medication  . 0.9 %  sodium chloride infusion    Allergies: Patient has no known allergies.  Body mass index is 26.64 kg/m.  Blood pressure (!) 146/75, pulse 73, temperature 98.6 F (37 C), temperature source Oral, height 5' 1.5" (1.562 m), weight 143 lb 4.8 oz (65 kg), SpO2 97 %.     Review of Systems  Constitutional: Positive for fatigue. Negative for activity change, appetite change, chills, diaphoresis, fever and unexpected weight change.  HENT: Negative for congestion.   Respiratory: Negative for cough, chest tightness, shortness of breath, wheezing and stridor.   Cardiovascular: Negative for chest pain, palpitations and leg swelling.  Gastrointestinal: Negative  for abdominal distention, anal bleeding, blood in stool, constipation, diarrhea, nausea and vomiting.  Endocrine: Negative for cold intolerance, heat intolerance, polydipsia, polyphagia and polyuria.  Genitourinary: Negative for difficulty urinating and flank pain.  Skin: Negative for color change, pallor, rash and wound.  Neurological: Negative for dizziness and headaches.  Hematological: Negative for adenopathy. Does not bruise/bleed easily.  Psychiatric/Behavioral: Negative for agitation, behavioral problems, confusion, decreased concentration, dysphoric mood, hallucinations, self-injury, sleep disturbance and suicidal ideas. The patient is not nervous/anxious and is not hyperactive.        Objective:   Physical Exam Constitutional:      General: She is not in acute distress.    Appearance: Normal appearance. She is normal weight. She is not ill-appearing, toxic-appearing or diaphoretic.  HENT:     Head: Normocephalic and atraumatic.     Right Ear: Tympanic membrane, ear canal and external ear normal. There is no impacted cerumen.     Left Ear: Tympanic membrane, ear canal and external ear normal. There is no impacted cerumen.     Nose: Nose normal. No congestion or rhinorrhea.     Mouth/Throat:     Mouth:  Mucous membranes are moist.     Pharynx: No oropharyngeal exudate.  Eyes:     Extraocular Movements: Extraocular movements intact.     Conjunctiva/sclera: Conjunctivae normal.     Pupils: Pupils are equal, round, and reactive to light.  Neck:     Musculoskeletal: Normal range of motion. No muscular tenderness.  Cardiovascular:     Rate and Rhythm: Normal rate and regular rhythm.     Pulses: Normal pulses.     Heart sounds: Normal heart sounds. No murmur. No friction rub. No gallop.   Pulmonary:     Effort: Pulmonary effort is normal. No respiratory distress.     Breath sounds: No stridor. No wheezing, rhonchi or rales.  Chest:     Chest wall: No tenderness.  Abdominal:     General: Abdomen is flat. There is no distension.     Tenderness: There is no abdominal tenderness. There is no right CVA tenderness, left CVA tenderness, guarding or rebound.     Hernia: No hernia is present.  Skin:    General: Skin is warm and dry.     Findings: No erythema.  Neurological:     Mental Status: She is alert and oriented to person, place, and time.     Coordination: Coordination normal.  Psychiatric:        Mood and Affect: Mood normal.        Behavior: Behavior normal.        Thought Content: Thought content normal.        Judgment: Judgment normal.        Assessment & Plan:   1. Encounter for screening for malignant neoplasm of respiratory organs   2. Healthcare maintenance   3. Hyperlipidemia, unspecified hyperlipidemia type     Healthcare maintenance Continue all medications directed, with on addition- Start Atorvastatin 20mg  once daily- take at 6pm. Remain well hydrated, follow heart healthy diet. Continue to walk "[name]" daily. Please schedule non-fasting lab appt in 6 weeks, office visit in 4 months and come fasting to that appt. Continue to social distance and wear a mask when in public.  Hyperlipidemia The 10-year ASCVD risk score Mikey Bussing DC Jr., et al., 2013) is: 35.6%    Values used to calculate the score:     Age: 81 years     Sex: Female  Is Non-Hispanic African American: No     Diabetic: No     Tobacco smoker: Yes     Systolic Blood Pressure: 314 mmHg     Is BP treated: Yes     HDL Cholesterol: 57 mg/dL     Total Cholesterol: 311 mg/dL  LDL-225 Started on Atorvastatin 20mg  QD Check hepatic fx in 6 weeks  Lipids in 4 months     FOLLOW-UP:  Return in about 4 months (around 01/30/2019) for Regular Follow Up, HTN, Hypercholestermia, Fasting Labs.

## 2018-09-29 ENCOUNTER — Encounter: Payer: Self-pay | Admitting: Adult Health

## 2018-09-29 ENCOUNTER — Ambulatory Visit (INDEPENDENT_AMBULATORY_CARE_PROVIDER_SITE_OTHER): Payer: Medicare Other | Admitting: Adult Health

## 2018-09-29 ENCOUNTER — Other Ambulatory Visit: Payer: Self-pay

## 2018-09-29 DIAGNOSIS — Z0000 Encounter for general adult medical examination without abnormal findings: Secondary | ICD-10-CM | POA: Diagnosis not present

## 2018-09-29 DIAGNOSIS — Z122 Encounter for screening for malignant neoplasm of respiratory organs: Secondary | ICD-10-CM | POA: Diagnosis not present

## 2018-09-29 DIAGNOSIS — E785 Hyperlipidemia, unspecified: Secondary | ICD-10-CM | POA: Diagnosis not present

## 2018-09-29 LAB — T3 UPTAKE: T3 Uptake Ratio: 26 % (ref 24–39)

## 2018-09-29 LAB — SPECIMEN STATUS REPORT

## 2018-09-29 LAB — T4, FREE: Free T4: 1.03 ng/dL (ref 0.82–1.77)

## 2018-09-29 MED ORDER — ATORVASTATIN CALCIUM 20 MG PO TABS: 20.0000 mg | ORAL_TABLET | Freq: Every day | ORAL | 1 refills | Status: DC

## 2018-09-29 NOTE — Assessment & Plan Note (Signed)
The 10-year ASCVD risk score Mikey Bussing DC Brooke Bonito., et al., 2013) is: 35.6%   Values used to calculate the score:     Age: 73 years     Sex: Female     Is Non-Hispanic African American: No     Diabetic: No     Tobacco smoker: Yes     Systolic Blood Pressure: 501 mmHg     Is BP treated: Yes     HDL Cholesterol: 57 mg/dL     Total Cholesterol: 311 mg/dL  LDL-225 Started on Atorvastatin 20mg  QD Check hepatic fx in 6 weeks  Lipids in 4 months

## 2018-09-29 NOTE — Patient Instructions (Addendum)
Preventive Care for Adults, Female  A healthy lifestyle and preventive care can promote health and wellness. Preventive health guidelines for women include the following key practices.   A routine yearly physical is a good way to check with your health care provider about your health and preventive screening. It is a chance to share any concerns and updates on your health and to receive a thorough exam.   Visit your dentist for a routine exam and preventive care every 6 months. Brush your teeth twice a day and floss once a day. Good oral hygiene prevents tooth decay and gum disease.   The frequency of eye exams is based on your age, health, family medical history, use of contact lenses, and other factors. Follow your health care provider's recommendations for frequency of eye exams.   Eat a healthy diet. Foods like vegetables, fruits, whole grains, low-fat dairy products, and lean protein foods contain the nutrients you need without too many calories. Decrease your intake of foods high in solid fats, added sugars, and salt. Eat the right amount of calories for you.Get information about a proper diet from your health care provider, if necessary.   Regular physical exercise is one of the most important things you can do for your health. Most adults should get at least 150 minutes of moderate-intensity exercise (any activity that increases your heart rate and causes you to sweat) each week. In addition, most adults need muscle-strengthening exercises on 2 or more days a week.   Maintain a healthy weight. The body mass index (BMI) is a screening tool to identify possible weight problems. It provides an estimate of body fat based on height and weight. Your health care provider can find your BMI, and can help you achieve or maintain a healthy weight.For adults 20 years and older:   - A BMI below 18.5 is considered underweight.   - A BMI of 18.5 to 24.9 is normal.   - A BMI of 25 to 29.9 is  considered overweight.   - A BMI of 30 and above is considered obese.   Maintain normal blood lipids and cholesterol levels by exercising and minimizing your intake of trans and saturated fats.  Eat a balanced diet with plenty of fruit and vegetables. Blood tests for lipids and cholesterol should begin at age 20 and be repeated every 5 years minimum.  If your lipid or cholesterol levels are high, you are over 40, or you are at high risk for heart disease, you may need your cholesterol levels checked more frequently.Ongoing high lipid and cholesterol levels should be treated with medicines if diet and exercise are not working.   If you smoke, find out from your health care provider how to quit. If you do not use tobacco, do not start.   Lung cancer screening is recommended for adults aged 55-80 years who are at high risk for developing lung cancer because of a history of smoking. A yearly low-dose CT scan of the lungs is recommended for people who have at least a 30-pack-year history of smoking and are a current smoker or have quit within the past 15 years. A pack year of smoking is smoking an average of 1 pack of cigarettes a day for 1 year (for example: 1 pack a day for 30 years or 2 packs a day for 15 years). Yearly screening should continue until the smoker has stopped smoking for at least 15 years. Yearly screening should be stopped for people who develop a   health problem that would prevent them from having lung cancer treatment.   If you are pregnant, do not drink alcohol. If you are breastfeeding, be very cautious about drinking alcohol. If you are not pregnant and choose to drink alcohol, do not have more than 1 drink per day. One drink is considered to be 12 ounces (355 mL) of beer, 5 ounces (148 mL) of wine, or 1.5 ounces (44 mL) of liquor.   Avoid use of street drugs. Do not share needles with anyone. Ask for help if you need support or instructions about stopping the use of  drugs.   High blood pressure causes heart disease and increases the risk of stroke. Your blood pressure should be checked at least yearly.  Ongoing high blood pressure should be treated with medicines if weight loss and exercise do not work.   If you are 69-55 years old, ask your health care provider if you should take aspirin to prevent strokes.   Diabetes screening involves taking a blood sample to check your fasting blood sugar level. This should be done once every 3 years, after age 38, if you are within normal weight and without risk factors for diabetes. Testing should be considered at a younger age or be carried out more frequently if you are overweight and have at least 1 risk factor for diabetes.   Breast cancer screening is essential preventive care for women. You should practice "breast self-awareness."  This means understanding the normal appearance and feel of your breasts and may include breast self-examination.  Any changes detected, no matter how small, should be reported to a health care provider.  Women in their 80s and 30s should have a clinical breast exam (CBE) by a health care provider as part of a regular health exam every 1 to 3 years.  After age 66, women should have a CBE every year.  Starting at age 1, women should consider having a mammogram (breast X-ray test) every year.  Women who have a family history of breast cancer should talk to their health care provider about genetic screening.  Women at a high risk of breast cancer should talk to their health care providers about having an MRI and a mammogram every year.   -Breast cancer gene (BRCA)-related cancer risk assessment is recommended for women who have family members with BRCA-related cancers. BRCA-related cancers include breast, ovarian, tubal, and peritoneal cancers. Having family members with these cancers may be associated with an increased risk for harmful changes (mutations) in the breast cancer genes BRCA1 and  BRCA2. Results of the assessment will determine the need for genetic counseling and BRCA1 and BRCA2 testing.   The Pap test is a screening test for cervical cancer. A Pap test can show cell changes on the cervix that might become cervical cancer if left untreated. A Pap test is a procedure in which cells are obtained and examined from the lower end of the uterus (cervix).   - Women should have a Pap test starting at age 57.   - Between ages 90 and 70, Pap tests should be repeated every 2 years.   - Beginning at age 63, you should have a Pap test every 3 years as long as the past 3 Pap tests have been normal.   - Some women have medical problems that increase the chance of getting cervical cancer. Talk to your health care provider about these problems. It is especially important to talk to your health care provider if a  new problem develops soon after your last Pap test. In these cases, your health care provider may recommend more frequent screening and Pap tests.   - The above recommendations are the same for women who have or have not gotten the vaccine for human papillomavirus (HPV).   - If you had a hysterectomy for a problem that was not cancer or a condition that could lead to cancer, then you no longer need Pap tests. Even if you no longer need a Pap test, a regular exam is a good idea to make sure no other problems are starting.   - If you are between ages 36 and 66 years, and you have had normal Pap tests going back 10 years, you no longer need Pap tests. Even if you no longer need a Pap test, a regular exam is a good idea to make sure no other problems are starting.   - If you have had past treatment for cervical cancer or a condition that could lead to cancer, you need Pap tests and screening for cancer for at least 20 years after your treatment.   - If Pap tests have been discontinued, risk factors (such as a new sexual partner) need to be reassessed to determine if screening should  be resumed.   - The HPV test is an additional test that may be used for cervical cancer screening. The HPV test looks for the virus that can cause the cell changes on the cervix. The cells collected during the Pap test can be tested for HPV. The HPV test could be used to screen women aged 70 years and older, and should be used in women of any age who have unclear Pap test results. After the age of 67, women should have HPV testing at the same frequency as a Pap test.   Colorectal cancer can be detected and often prevented. Most routine colorectal cancer screening begins at the age of 57 years and continues through age 26 years. However, your health care provider may recommend screening at an earlier age if you have risk factors for colon cancer. On a yearly basis, your health care provider may provide home test kits to check for hidden blood in the stool.  Use of a small camera at the end of a tube, to directly examine the colon (sigmoidoscopy or colonoscopy), can detect the earliest forms of colorectal cancer. Talk to your health care provider about this at age 23, when routine screening begins. Direct exam of the colon should be repeated every 5 -10 years through age 49 years, unless early forms of pre-cancerous polyps or small growths are found.   People who are at an increased risk for hepatitis B should be screened for this virus. You are considered at high risk for hepatitis B if:  -You were born in a country where hepatitis B occurs often. Talk with your health care provider about which countries are considered high risk.  - Your parents were born in a high-risk country and you have not received a shot to protect against hepatitis B (hepatitis B vaccine).  - You have HIV or AIDS.  - You use needles to inject street drugs.  - You live with, or have sex with, someone who has Hepatitis B.  - You get hemodialysis treatment.  - You take certain medicines for conditions like cancer, organ  transplantation, and autoimmune conditions.   Hepatitis C blood testing is recommended for all people born from [birth year] through 1965 and any individual  with known risks for hepatitis C.   Practice safe sex. Use condoms and avoid high-risk sexual practices to reduce the spread of sexually transmitted infections (STIs). STIs include gonorrhea, chlamydia, syphilis, trichomonas, herpes, HPV, and human immunodeficiency virus (HIV). Herpes, HIV, and HPV are viral illnesses that have no cure. They can result in disability, cancer, and death. Sexually active women aged 25 years and younger should be checked for chlamydia. Older women with new or multiple partners should also be tested for chlamydia. Testing for other STIs is recommended if you are sexually active and at increased risk.   Osteoporosis is a disease in which the bones lose minerals and strength with aging. This can result in serious bone fractures or breaks. The risk of osteoporosis can be identified using a bone density scan. Women ages 65 years and over and women at risk for fractures or osteoporosis should discuss screening with their health care providers. Ask your health care provider whether you should take a calcium supplement or vitamin D to There are also several preventive steps women can take to avoid osteoporosis and resulting fractures or to keep osteoporosis from worsening. -->Recommendations include:  Eat a balanced diet high in fruits, vegetables, calcium, and vitamins.  Get enough calcium. The recommended total intake of is 1,200 mg daily; for best absorption, if taking supplements, divide doses into 250-500 mg doses throughout the day. Of the two types of calcium, calcium carbonate is best absorbed when taken with food but calcium citrate can be taken on an empty stomach.  Get enough vitamin D. NAMS and the National Osteoporosis Foundation recommend at least 1,000 IU per day for women age 50 and over who are at risk of vitamin D  deficiency. Vitamin D deficiency can be caused by inadequate sun exposure (for example, those who live in northern latitudes).  Avoid alcohol and smoking. Heavy alcohol intake (more than 7 drinks per week) increases the risk of falls and hip fracture and women smokers tend to lose bone more rapidly and have lower bone mass than nonsmokers. Stopping smoking is one of the most important changes women can make to improve their health and decrease risk for disease.  Be physically active every day. Weight-bearing exercise (for example, fast walking, hiking, jogging, and weight training) may strengthen bones or slow the rate of bone loss that comes with aging. Balancing and muscle-strengthening exercises can reduce the risk of falling and fracture.  Consider therapeutic medications. Currently, several types of effective drugs are available. Healthcare providers can recommend the type most appropriate for each woman.  Eliminate environmental factors that may contribute to accidents. Falls cause nearly 90% of all osteoporotic fractures, so reducing this risk is an important bone-health strategy. Measures include ample lighting, removing obstructions to walking, using nonskid rugs on floors, and placing mats and/or grab bars in showers.  Be aware of medication side effects. Some common medicines make bones weaker. These include a type of steroid drug called glucocorticoids used for arthritis and asthma, some antiseizure drugs, certain sleeping pills, treatments for endometriosis, and some cancer drugs. An overactive thyroid gland or using too much thyroid hormone for an underactive thyroid can also be a problem. If you are taking these medicines, talk to your doctor about what you can do to help protect your bones.reduce the rate of osteoporosis.    Menopause can be associated with physical symptoms and risks. Hormone replacement therapy is available to decrease symptoms and risks. You should talk to your  health care provider   about whether hormone replacement therapy is right for you.   Use sunscreen. Apply sunscreen liberally and repeatedly throughout the day. You should seek shade when your shadow is shorter than you. Protect yourself by wearing long sleeves, pants, a wide-brimmed hat, and sunglasses year round, whenever you are outdoors.   Once a month, do a whole body skin exam, using a mirror to look at the skin on your back. Tell your health care provider of new moles, moles that have irregular borders, moles that are larger than a pencil eraser, or moles that have changed in shape or color.   -Stay current with required vaccines (immunizations).   Influenza vaccine. All adults should be immunized every year.  Tetanus, diphtheria, and acellular pertussis (Td, Tdap) vaccine. Pregnant women should receive 1 dose of Tdap vaccine during each pregnancy. The dose should be obtained regardless of the length of time since the last dose. Immunization is preferred during the 27th 36th week of gestation. An adult who has not previously received Tdap or who does not know her vaccine status should receive 1 dose of Tdap. This initial dose should be followed by tetanus and diphtheria toxoids (Td) booster doses every 10 years. Adults with an unknown or incomplete history of completing a 3-dose immunization series with Td-containing vaccines should begin or complete a primary immunization series including a Tdap dose. Adults should receive a Td booster every 10 years.  Varicella vaccine. An adult without evidence of immunity to varicella should receive 2 doses or a second dose if she has previously received 1 dose. Pregnant females who do not have evidence of immunity should receive the first dose after pregnancy. This first dose should be obtained before leaving the health care facility. The second dose should be obtained 4 8 weeks after the first dose.  Human papillomavirus (HPV) vaccine. Females aged 13 26  years who have not received the vaccine previously should obtain the 3-dose series. The vaccine is not recommended for use in pregnant females. However, pregnancy testing is not needed before receiving a dose. If a female is found to be pregnant after receiving a dose, no treatment is needed. In that case, the remaining doses should be delayed until after the pregnancy. Immunization is recommended for any person with an immunocompromised condition through the age of 26 years if she did not get any or all doses earlier. During the 3-dose series, the second dose should be obtained 4 8 weeks after the first dose. The third dose should be obtained 24 weeks after the first dose and 16 weeks after the second dose.  Zoster vaccine. One dose is recommended for adults aged 60 years or older unless certain conditions are present.  Measles, mumps, and rubella (MMR) vaccine. Adults born before 1957 generally are considered immune to measles and mumps. Adults born in 1957 or later should have 1 or more doses of MMR vaccine unless there is a contraindication to the vaccine or there is laboratory evidence of immunity to each of the three diseases. A routine second dose of MMR vaccine should be obtained at least 28 days after the first dose for students attending postsecondary schools, health care workers, or international travelers. People who received inactivated measles vaccine or an unknown type of measles vaccine during 1963 1967 should receive 2 doses of MMR vaccine. People who received inactivated mumps vaccine or an unknown type of mumps vaccine before 1979 and are at high risk for mumps infection should consider immunization with 2 doses of   MMR vaccine. For females of childbearing age, rubella immunity should be determined. If there is no evidence of immunity, females who are not pregnant should be vaccinated. If there is no evidence of immunity, females who are pregnant should delay immunization until after pregnancy.  Unvaccinated health care workers born before [birth year] who lack laboratory evidence of measles, mumps, or rubella immunity or laboratory confirmation of disease should consider measles and mumps immunization with 2 doses of MMR vaccine or rubella immunization with 1 dose of MMR vaccine.  Pneumococcal 13-valent conjugate (PCV13) vaccine. When indicated, a person who is uncertain of her immunization history and has no record of immunization should receive the PCV13 vaccine. An adult aged 54 years or older who has certain medical conditions and has not been previously immunized should receive 1 dose of PCV13 vaccine. This PCV13 should be followed with a dose of pneumococcal polysaccharide (PPSV23) vaccine. The PPSV23 vaccine dose should be obtained at least 8 weeks after the dose of PCV13 vaccine. An adult aged 58 years or older who has certain medical conditions and previously received 1 or more doses of PPSV23 vaccine should receive 1 dose of PCV13. The PCV13 vaccine dose should be obtained 1 or more years after the last PPSV23 vaccine dose.  Pneumococcal polysaccharide (PPSV23) vaccine. When PCV13 is also indicated, PCV13 should be obtained first. All adults aged 58 years and older should be immunized. An adult younger than age 65 years who has certain medical conditions should be immunized. Any person who resides in a nursing home or long-term care facility should be immunized. An adult smoker should be immunized. People with an immunocompromised condition and certain other conditions should receive both PCV13 and PPSV23 vaccines. People with human immunodeficiency virus (HIV) infection should be immunized as soon as possible after diagnosis. Immunization during chemotherapy or radiation therapy should be avoided. Routine use of PPSV23 vaccine is not recommended for American Indians, [place] Natives, or people younger than 65 years unless there are medical conditions that require PPSV23 vaccine. When indicated,  people who have unknown immunization and have no record of immunization should receive PPSV23 vaccine. One-time revaccination 5 years after the first dose of PPSV23 is recommended for people aged 70 64 years who have chronic kidney failure, nephrotic syndrome, asplenia, or immunocompromised conditions. People who received 1 2 doses of PPSV23 before age 32 years should receive another dose of PPSV23 vaccine at age 96 years or later if at least 5 years have passed since the previous dose. Doses of PPSV23 are not needed for people immunized with PPSV23 at or after age 55 years.  Meningococcal vaccine. Adults with asplenia or persistent complement component deficiencies should receive 2 doses of quadrivalent meningococcal conjugate (MenACWY-D) vaccine. The doses should be obtained at least 2 months apart. Microbiologists working with certain meningococcal bacteria, [employer] recruits, people at risk during an outbreak, and people who travel to or live in countries with a high rate of meningitis should be immunized. A first-year college student up through age 58 years who is living in a residence hall should receive a dose if she did not receive a dose on or after her 16th birthday. Adults who have certain high-risk conditions should receive one or more doses of vaccine.  Hepatitis A vaccine. Adults who wish to be protected from this disease, have certain high-risk conditions, work with hepatitis A-infected animals, work in hepatitis A research labs, or travel to or work in countries with a high rate of hepatitis A should be  immunized. Adults who were previously unvaccinated and who anticipate close contact with an international adoptee during the first 60 days after arrival in the [place] from a country with a high rate of hepatitis A should be immunized.  Hepatitis B vaccine.  Adults who wish to be protected from this disease, have certain high-risk conditions, may be exposed to blood or other infectious  body fluids, are household contacts or sex partners of hepatitis B positive people, are clients or workers in certain care facilities, or travel to or work in countries with a high rate of hepatitis B should be immunized.  Haemophilus influenzae type b (Hib) vaccine. A previously unvaccinated person with asplenia or sickle cell disease or having a scheduled splenectomy should receive 1 dose of Hib vaccine. Regardless of previous immunization, a recipient of a hematopoietic stem cell transplant should receive a 3-dose series 6 12 months after her successful transplant. Hib vaccine is not recommended for adults with HIV infection.  Preventive Services / Frequency Ages 6 to 39years  Blood pressure check.** / Every 1 to 2 years.  Lipid and cholesterol check.** / Every 5 years beginning at age 39.  Clinical breast exam.** / Every 3 years for women in their 61s and 62s.  BRCA-related cancer risk assessment.** / For women who have family members with a BRCA-related cancer (breast, ovarian, tubal, or peritoneal cancers).  Pap test.** / Every 2 years from ages 47 through 85. Every 3 years starting at age 34 through age 12 or 74 with a history of 3 consecutive normal Pap tests.  HPV screening.** / Every 3 years from ages 46 through ages 43 to 54 with a history of 3 consecutive normal Pap tests.  Hepatitis C blood test.** / For any individual with known risks for hepatitis C.  Skin self-exam. / Monthly.  Influenza vaccine. / Every year.  Tetanus, diphtheria, and acellular pertussis (Tdap, Td) vaccine.** / Consult your health care provider. Pregnant women should receive 1 dose of Tdap vaccine during each pregnancy. 1 dose of Td every 10 years.  Varicella vaccine.** / Consult your health care provider. Pregnant females who do not have evidence of immunity should receive the first dose after pregnancy.  HPV vaccine. / 3 doses over 6 months, if 64 and younger. The vaccine is not recommended for use in  pregnant females. However, pregnancy testing is not needed before receiving a dose.  Measles, mumps, rubella (MMR) vaccine.** / You need at least 1 dose of MMR if you were born in 1957 or later. You may also need a 2nd dose. For females of childbearing age, rubella immunity should be determined. If there is no evidence of immunity, females who are not pregnant should be vaccinated. If there is no evidence of immunity, females who are pregnant should delay immunization until after pregnancy.  Pneumococcal 13-valent conjugate (PCV13) vaccine.** / Consult your health care provider.  Pneumococcal polysaccharide (PPSV23) vaccine.** / 1 to 2 doses if you smoke cigarettes or if you have certain conditions.  Meningococcal vaccine.** / 1 dose if you are age 71 to 37 years and a Market researcher living in a residence hall, or have one of several medical conditions, you need to get vaccinated against meningococcal disease. You may also need additional booster doses.  Hepatitis A vaccine.** / Consult your health care provider.  Hepatitis B vaccine.** / Consult your health care provider.  Haemophilus influenzae type b (Hib) vaccine.** / Consult your health care provider.  Ages 55 to 64years  Blood pressure check.** / Every 1 to 2 years.  Lipid and cholesterol check.** / Every 5 years beginning at age 20 years.  Lung cancer screening. / Every year if you are aged 55 80 years and have a 30-pack-year history of smoking and currently smoke or have quit within the past 15 years. Yearly screening is stopped once you have quit smoking for at least 15 years or develop a health problem that would prevent you from having lung cancer treatment.  Clinical breast exam.** / Every year after age 40 years.  BRCA-related cancer risk assessment.** / For women who have family members with a BRCA-related cancer (breast, ovarian, tubal, or peritoneal cancers).  Mammogram.** / Every year beginning at age 40  years and continuing for as long as you are in good health. Consult with your health care provider.  Pap test.** / Every 3 years starting at age 30 years through age 65 or 70 years with a history of 3 consecutive normal Pap tests.  HPV screening.** / Every 3 years from ages 30 years through ages 65 to 70 years with a history of 3 consecutive normal Pap tests.  Fecal occult blood test (FOBT) of stool. / Every year beginning at age 50 years and continuing until age 75 years. You may not need to do this test if you get a colonoscopy every 10 years.  Flexible sigmoidoscopy or colonoscopy.** / Every 5 years for a flexible sigmoidoscopy or every 10 years for a colonoscopy beginning at age 50 years and continuing until age 75 years.  Hepatitis C blood test.** / For all people born from 1945 through 1965 and any individual with known risks for hepatitis C.  Skin self-exam. / Monthly.  Influenza vaccine. / Every year.  Tetanus, diphtheria, and acellular pertussis (Tdap/Td) vaccine.** / Consult your health care provider. Pregnant women should receive 1 dose of Tdap vaccine during each pregnancy. 1 dose of Td every 10 years.  Varicella vaccine.** / Consult your health care provider. Pregnant females who do not have evidence of immunity should receive the first dose after pregnancy.  Zoster vaccine.** / 1 dose for adults aged 60 years or older.  Measles, mumps, rubella (MMR) vaccine.** / You need at least 1 dose of MMR if you were born in 1957 or later. You may also need a 2nd dose. For females of childbearing age, rubella immunity should be determined. If there is no evidence of immunity, females who are not pregnant should be vaccinated. If there is no evidence of immunity, females who are pregnant should delay immunization until after pregnancy.  Pneumococcal 13-valent conjugate (PCV13) vaccine.** / Consult your health care provider.  Pneumococcal polysaccharide (PPSV23) vaccine.** / 1 to 2 doses if  you smoke cigarettes or if you have certain conditions.  Meningococcal vaccine.** / Consult your health care provider.  Hepatitis A vaccine.** / Consult your health care provider.  Hepatitis B vaccine.** / Consult your health care provider.  Haemophilus influenzae type b (Hib) vaccine.** / Consult your health care provider.  Ages 65 years and over  Blood pressure check.** / Every 1 to 2 years.  Lipid and cholesterol check.** / Every 5 years beginning at age 20 years.  Lung cancer screening. / Every year if you are aged 55 80 years and have a 30-pack-year history of smoking and currently smoke or have quit within the past 15 years. Yearly screening is stopped once you have quit smoking for at least 15 years or develop a health problem that   would prevent you from having lung cancer treatment.  Clinical breast exam.** / Every year after age 103 years.  BRCA-related cancer risk assessment.** / For women who have family members with a BRCA-related cancer (breast, ovarian, tubal, or peritoneal cancers).  Mammogram.** / Every year beginning at age 36 years and continuing for as long as you are in good health. Consult with your health care provider.  Pap test.** / Every 3 years starting at age 5 years through age 85 or 10 years with 3 consecutive normal Pap tests. Testing can be stopped between 65 and 70 years with 3 consecutive normal Pap tests and no abnormal Pap or HPV tests in the past 10 years.  HPV screening.** / Every 3 years from ages 93 years through ages 70 or 45 years with a history of 3 consecutive normal Pap tests. Testing can be stopped between 65 and 70 years with 3 consecutive normal Pap tests and no abnormal Pap or HPV tests in the past 10 years.  Fecal occult blood test (FOBT) of stool. / Every year beginning at age 8 years and continuing until age 45 years. You may not need to do this test if you get a colonoscopy every 10 years.  Flexible sigmoidoscopy or colonoscopy.** /  Every 5 years for a flexible sigmoidoscopy or every 10 years for a colonoscopy beginning at age 69 years and continuing until age 68 years.  Hepatitis C blood test.** / For all people born from [birth year] through 1967 and any individual with known risks for hepatitis C.  Osteoporosis screening.** / A one-time screening for women ages 7 years and over and women at risk for fractures or osteoporosis.  Skin self-exam. / Monthly.  Influenza vaccine. / Every year.  Tetanus, diphtheria, and acellular pertussis (Tdap/Td) vaccine.** / 1 dose of Td every 10 years.  Varicella vaccine.** / Consult your health care provider.  Zoster vaccine.** / 1 dose for adults aged 5 years or older.  Pneumococcal 13-valent conjugate (PCV13) vaccine.** / Consult your health care provider.  Pneumococcal polysaccharide (PPSV23) vaccine.** / 1 dose for all adults aged 74 years and older.  Meningococcal vaccine.** / Consult your health care provider.  Hepatitis A vaccine.** / Consult your health care provider.  Hepatitis B vaccine.** / Consult your health care provider.  Haemophilus influenzae type b (Hib) vaccine.** / Consult your health care provider. ** Family history and personal history of risk and conditions may change your health care provider's recommendations. Document Released: 04/23/2001 Document Revised: 12/16/2012  [facility] Patient Information 2014 [place], [place].   EXERCISE AND DIET:  We recommended that you start or continue a regular exercise program for good health. Regular exercise means any activity that makes your heart beat faster and makes you sweat.  We recommend exercising at least 30 minutes per day at least 3 days a week, preferably 5.  We also recommend a diet low in fat and sugar / carbohydrates.  Inactivity, poor dietary choices and obesity can cause diabetes, heart attack, stroke, and kidney damage, among others.     ALCOHOL AND SMOKING:  Women should limit their alcohol intake to no  more than 7 drinks/beers/glasses of wine (combined, not each!) per week. Moderation of alcohol intake to this level decreases your risk of breast cancer and liver damage.  ( And of course, no recreational drugs are part of a healthy lifestyle.)  Also, you should not be smoking at all or even being exposed to second hand smoke. Most people know smoking can  cause cancer, and various heart and lung diseases, but did you know it also contributes to weakening of your bones?  Aging of your skin?  Yellowing of your teeth and nails?   CALCIUM AND VITAMIN D:  Adequate intake of calcium and Vitamin D are recommended.  The recommendations for exact amounts of these supplements seem to change often, but generally speaking 600 mg of calcium (either carbonate or citrate) and 800 units of Vitamin D per day seems prudent. Certain women may benefit from higher intake of Vitamin D.  If you are among these women, your doctor will have told you during your visit.     PAP SMEARS:  Pap smears, to check for cervical cancer or precancers,  have traditionally been done yearly, although recent scientific advances have shown that most women can have pap smears less often.  However, every woman still should have a physical exam from her gynecologist or primary care physician every year. It will include a breast check, inspection of the vulva and vagina to check for abnormal growths or skin changes, a visual exam of the cervix, and then an exam to evaluate the size and shape of the uterus and ovaries.  And after 75 years of age, a rectal exam is indicated to check for rectal cancers. We will also provide age appropriate advice regarding health maintenance, like when you should have certain vaccines, screening for sexually transmitted diseases, bone density testing, colonoscopy, mammograms, etc.    MAMMOGRAMS:  All women over 51 years old should have a yearly mammogram. Many facilities now offer a "3D" mammogram, which may cost  around $50 extra out of pocket. If possible,  we recommend you accept the option to have the 3D mammogram performed.  It both reduces the number of women who will be called back for extra views which then turn out to be normal, and it is better than the routine mammogram at detecting truly abnormal areas.     COLONOSCOPY:  Colonoscopy to screen for colon cancer is recommended for all women at age 56.  We know, you hate the idea of the prep.  We agree, BUT, having colon cancer and not knowing it is worse!!  Colon cancer so often starts as a polyp that can be seen and removed at colonscopy, which can quite literally save your life!  And if your first colonoscopy is normal and you have no family history of colon cancer, most women don't have to have it again for 10 years.  Once every ten years, you can do something that may end up saving your life, right?  We will be happy to help you get it scheduled when you are ready.  Be sure to check your insurance coverage so you understand how much it will cost.  It may be covered as a preventative service at no cost, but you should check your particular policy.   Continue all medications directed, with on addition- Start Atorvastatin '20mg'$  once daily- take at 6pm. Remain well hydrated, follow heart healthy diet. Continue to walk "[name]" daily. Please schedule non-fasting lab appt in 6 weeks, office visit in 4 months and come fasting to that appt. Continue to social distance and wear a mask when in public. GREAT TO SEE YOU!

## 2018-09-29 NOTE — Assessment & Plan Note (Signed)
Continue all medications directed, with on addition- Start Atorvastatin 20mg  once daily- take at 6pm. Remain well hydrated, follow heart healthy diet. Continue to walk "Glenda Stevens" daily. Please schedule non-fasting lab appt in 6 weeks, office visit in 4 months and come fasting to that appt. Continue to social distance and wear a mask when in public.

## 2018-10-01 ENCOUNTER — Telehealth: Payer: Self-pay | Admitting: Hematology

## 2018-10-01 ENCOUNTER — Inpatient Hospital Stay: Payer: Medicare Other | Attending: Hematology

## 2018-10-01 ENCOUNTER — Encounter: Payer: Self-pay | Admitting: Hematology

## 2018-10-01 ENCOUNTER — Inpatient Hospital Stay (HOSPITAL_BASED_OUTPATIENT_CLINIC_OR_DEPARTMENT_OTHER): Payer: Medicare Other | Admitting: Hematology

## 2018-10-01 ENCOUNTER — Other Ambulatory Visit: Payer: Self-pay

## 2018-10-01 VITALS — BP 158/75 | HR 70 | Temp 97.7°F | Resp 18 | Ht 61.5 in | Wt 143.0 lb

## 2018-10-01 DIAGNOSIS — F419 Anxiety disorder, unspecified: Secondary | ICD-10-CM | POA: Diagnosis not present

## 2018-10-01 DIAGNOSIS — Z90710 Acquired absence of both cervix and uterus: Secondary | ICD-10-CM

## 2018-10-01 DIAGNOSIS — I10 Essential (primary) hypertension: Secondary | ICD-10-CM

## 2018-10-01 DIAGNOSIS — Z9221 Personal history of antineoplastic chemotherapy: Secondary | ICD-10-CM

## 2018-10-01 DIAGNOSIS — C20 Malignant neoplasm of rectum: Secondary | ICD-10-CM | POA: Diagnosis not present

## 2018-10-01 DIAGNOSIS — I701 Atherosclerosis of renal artery: Secondary | ICD-10-CM

## 2018-10-01 DIAGNOSIS — F17210 Nicotine dependence, cigarettes, uncomplicated: Secondary | ICD-10-CM | POA: Diagnosis not present

## 2018-10-01 DIAGNOSIS — Z79899 Other long term (current) drug therapy: Secondary | ICD-10-CM | POA: Insufficient documentation

## 2018-10-01 DIAGNOSIS — Z923 Personal history of irradiation: Secondary | ICD-10-CM

## 2018-10-01 DIAGNOSIS — E785 Hyperlipidemia, unspecified: Secondary | ICD-10-CM | POA: Insufficient documentation

## 2018-10-01 DIAGNOSIS — Z7982 Long term (current) use of aspirin: Secondary | ICD-10-CM | POA: Insufficient documentation

## 2018-10-01 LAB — CBC WITH DIFFERENTIAL/PLATELET
Abs Immature Granulocytes: 0.02 10*3/uL (ref 0.00–0.07)
Basophils Absolute: 0 10*3/uL (ref 0.0–0.1)
Basophils Relative: 1 %
Eosinophils Absolute: 0.1 10*3/uL (ref 0.0–0.5)
Eosinophils Relative: 2 %
HCT: 41.4 % (ref 36.0–46.0)
Hemoglobin: 13.7 g/dL (ref 12.0–15.0)
Immature Granulocytes: 0 %
Lymphocytes Relative: 13 %
Lymphs Abs: 0.9 10*3/uL (ref 0.7–4.0)
MCH: 30.9 pg (ref 26.0–34.0)
MCHC: 33.1 g/dL (ref 30.0–36.0)
MCV: 93.2 fL (ref 80.0–100.0)
Monocytes Absolute: 0.7 10*3/uL (ref 0.1–1.0)
Monocytes Relative: 10 %
Neutro Abs: 5.2 10*3/uL (ref 1.7–7.7)
Neutrophils Relative %: 74 %
Platelets: 215 10*3/uL (ref 150–400)
RBC: 4.44 MIL/uL (ref 3.87–5.11)
RDW: 12.5 % (ref 11.5–15.5)
WBC: 7 10*3/uL (ref 4.0–10.5)
nRBC: 0 % (ref 0.0–0.2)

## 2018-10-01 LAB — COMPREHENSIVE METABOLIC PANEL
ALT: 13 U/L (ref 0–44)
AST: 19 U/L (ref 15–41)
Albumin: 3.6 g/dL (ref 3.5–5.0)
Alkaline Phosphatase: 55 U/L (ref 38–126)
Anion gap: 6 (ref 5–15)
BUN: 12 mg/dL (ref 8–23)
CO2: 28 mmol/L (ref 22–32)
Calcium: 9.2 mg/dL (ref 8.9–10.3)
Chloride: 103 mmol/L (ref 98–111)
Creatinine, Ser: 1 mg/dL (ref 0.44–1.00)
GFR calc Af Amer: >60 mL/min (ref >60)
GFR calc non Af Amer: 55 mL/min — ABNORMAL LOW (ref >60)
Glucose, Bld: 75 mg/dL (ref 70–99)
Potassium: 4.1 mmol/L (ref 3.5–5.1)
Sodium: 137 mmol/L (ref 135–145)
Total Bilirubin: 0.3 mg/dL (ref 0.3–1.2)
Total Protein: 6.9 g/dL (ref 6.5–8.1)

## 2018-10-01 NOTE — Telephone Encounter (Signed)
Scheduled appt per 7/23 los.Printed and mailed appt calendar.

## 2018-10-09 ENCOUNTER — Encounter: Payer: Self-pay | Admitting: Gastroenterology

## 2018-10-11 ENCOUNTER — Other Ambulatory Visit: Payer: Self-pay | Admitting: Adult Health

## 2018-10-27 ENCOUNTER — Other Ambulatory Visit: Payer: Self-pay | Admitting: Adult Health

## 2018-11-02 NOTE — Progress Notes (Signed)
Subjective:    Patient ID: Glenda Stevens, female    DOB: [birth year], 73 y.o.   MRN: [id number]  HPI:  Glenda Stevens presents with lumbar back pain that began last week after fall when walking to the bathroom during the night 7 days ago.  She reports falling to floor, denies striking her head, impact directly to lumbar back.  She reports sitting on the ground for several minutes, then was able to stand on her own and went back to sleep without difficulty.  She denies bowel/bladder dysfunction. She denies numbness/tingling in lower extremities. She has been using OTC Aleve and Ibuprofen. She denies previous back injury. She reports pain has been steadily decreasing. She reports a constant dull ache, localized over lumber back, rated 4/10    Patient Care Team    Relationship Specialty Notifications Start End  [name] D, NP PCP - General Family Medicine  [id number]   Leighton Ruff, [place] Physician General Surgery  10/24/14   Kyung Rudd, MD Consulting Physician Radiation Oncology  10/24/14   Milus Banister, MD Attending Physician Gastroenterology  10/01/18     Patient Active Problem List   Diagnosis Date Noted  . Acute lumbar back pain 11/03/2018  . Blood glucose elevated 07/16/2018  . Syncope and collapse 06/03/2018  . Abnormal urinalysis 06/03/2018  . Dizziness 06/03/2018  . Poor memory 06/03/2018  . Family history of dementia 06/03/2018  . Healthcare maintenance 05/01/2017  . Corneal abrasion, right 10/17/2016  . Vitamin D deficiency 07/18/2016  . Other fatigue 05/09/2016  . Bilateral hip pain 05/09/2016  . Tinea corporis 05/09/2016  . Foot pain, bilateral 05/09/2016  . Medicare annual wellness visit, initial 07/06/2015  . Anxiety 07/06/2015  . Rectal cancer ([place]) 08/01/2014  . Diarrhea 07/08/2014  . Seborrheic keratoses 03/18/2014  . Nasal congestion 09/14/2013  . Smoker 09/14/2013  . Renal artery stenosis ([place]) 08/06/2013  . Hyperlipidemia 08/06/2013  . Essential  hypertension 08/06/2013     Past Medical History:  Diagnosis Date  . Allergy    Hay fever  . Anxiety   . Arthritis   . 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 GERD (gastroesophageal reflux disease)   . History of chicken pox   . Hyperlipidemia   . Hypertension   . Phlebitis and thrombophlebitis   . PONV (postoperative nausea and vomiting)   . Renal artery stenosis ([facility])    left stent in 2003  . Renovascular hypertension   . 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 ABDOMINAL HYSTERECTOMY    . BREAST SURGERY     bilateral lumpectomy-benign  . CATARACT EXTRACTION Bilateral   . COLON RESECTION N/A 08/18/2014   Procedure: LAPAROSCOPIC ASSISTED  COLON POLYP RESECTION , DIAGNOSTIC LAPROSCOPY ;  Surgeon: Leighton Ruff, MD;  Location: [facility];  Service: General;  Laterality: N/A;  . NASAL SINUS SURGERY     patient denies having had nasal sinus surgery  . NM MYOCAR PERF WALL MOTION  09/2009   bruce myoview - normal pattern of perfusion, EF 83%, low risk scan  . RENAL ARTERY ANGIOPLASTY  07/02/2001   6x48mm Genesis on Aviator balloon stent overlapping a 6x44mm Genesist on Aviator balloon stent (Dr. Marella Chimes)  . RENAL ARTERY STENT    . RENAL DOPPLER  09/2012   left renal artery stent - 60-99% diameter reduction  . TRANSTHORACIC ECHOCARDIOGRAM  06/2012  EF 55-60%, mild MR     Family History  Problem Relation Age of Onset  . Hypertension Mother   . Heart disease Mother   . Dementia Mother   . Hyperlipidemia Mother   . Heart disease Father   . Hyperlipidemia Father   . Cancer Father        lung cancer and prostate cancer   . Hypertension Father   . Breast cancer Cousin   . Cancer Cousin        4 paternal and 1 maternal cousins had breast cancer   . Healthy Brother   . Colon cancer Neg Hx   . Esophageal cancer Neg Hx   . Stomach cancer Neg Hx   . Rectal cancer Neg Hx      Social  History   Substance and Sexual Activity  Drug Use No     Social History   Substance and Sexual Activity  Alcohol Use Yes  . Alcohol/week: 7.0 standard drinks  . Types: 7 Glasses of wine per week     Social History   Tobacco Use  Smoking Status Current Some Day Smoker  . Packs/day: 1.00  . Years: 30.00  . Pack years: 30.00  . Types: Cigarettes  Smokeless Tobacco Never Used     Outpatient Encounter Medications as of 11/03/2018  Medication Sig  . amLODipine (NORVASC) 5 MG tablet TAKE 1 TABLET BY MOUTH  DAILY  . aspirin EC 81 MG tablet Take 81 mg by mouth daily.  Marland Kitchen atorvastatin (LIPITOR) 20 MG tablet Take 1 tablet (20 mg total) by mouth daily.  . cholecalciferol (VITAMIN D) 1000 units tablet Take 1,000 Units by mouth daily.  Marland Kitchen ketoconazole (NIZORAL) 2 % shampoo APPLY 1 APPLICATION TOPICALLY 2 (TWO) TIMES A WEEK.  . Multiple Vitamin (MULTIVITAMIN) tablet Take 1 tablet by mouth daily.  . propranolol (INDERAL) 20 MG tablet Take 1 tablet (20 mg total) by mouth 3 (three) times daily.  . ranitidine (ZANTAC) 150 MG tablet Take 150 mg by mouth daily as needed for heartburn.  . sertraline (ZOLOFT) 100 MG tablet Take 100 mg by mouth daily.   . tizanidine (ZANAFLEX) 2 MG capsule Take 1 capsule (2 mg total) by mouth 3 (three) times daily.   Facility-Administered Encounter Medications as of 11/03/2018  Medication  . 0.9 %  sodium chloride infusion    Allergies: Patient has no known allergies.  Body mass index is 26.81 kg/m.  Blood pressure (!) 164/75, pulse 67, temperature 98.2 F (36.8 C), temperature source Oral, height 5' 1.5" (1.562 m), weight 144 lb 3.2 oz (65.4 kg), SpO2 96 %.   Review of Systems  Constitutional: Positive for activity change and fatigue. Negative for appetite change, chills, diaphoresis and unexpected weight change.  Eyes: Negative for visual disturbance.  Respiratory: Negative for cough, chest tightness, shortness of breath and stridor.    Cardiovascular: Negative for chest pain, palpitations and leg swelling.  Gastrointestinal: Negative for abdominal distention, abdominal pain, blood in stool, constipation, diarrhea, nausea and vomiting.  Genitourinary: Negative for difficulty urinating and flank pain.  Musculoskeletal: Positive for arthralgias, back pain, gait problem and myalgias. Negative for joint swelling.  Neurological: Positive for headaches. Negative for dizziness.  Hematological: Negative for adenopathy. Does not bruise/bleed easily.  Psychiatric/Behavioral: Negative for agitation, behavioral problems, confusion, decreased concentration, dysphoric mood, hallucinations, self-injury, sleep disturbance and suicidal ideas. The patient is not nervous/anxious and is not hyperactive.        Objective:   Physical Exam Vitals signs  and nursing note reviewed.  Constitutional:      General: She is not in acute distress.    Appearance: Normal appearance. She is normal weight. She is not ill-appearing, toxic-appearing or diaphoretic.  Cardiovascular:     Rate and Rhythm: Normal rate and regular rhythm.     Pulses: Normal pulses.     Heart sounds: Normal heart sounds. No murmur. No friction rub. No gallop.   Pulmonary:     Effort: Pulmonary effort is normal. No respiratory distress.     Breath sounds: Normal breath sounds. No stridor. No wheezing, rhonchi or rales.  Chest:     Chest wall: No tenderness.  Abdominal:     General: Bowel sounds are normal. There is no distension.     Palpations: Abdomen is soft.     Tenderness: There is no abdominal tenderness.  Musculoskeletal: Normal range of motion.        General: Tenderness present.     Thoracic back: Normal.     Lumbar back: She exhibits tenderness and spasm. She exhibits normal range of motion and no bony tenderness.  Skin:    General: Skin is warm and dry.     Capillary Refill: Capillary refill takes less than 2 seconds.  Neurological:     Mental Status: She is  alert and oriented to person, place, and time.  Psychiatric:        Mood and Affect: Mood normal.        Behavior: Behavior normal.        Thought Content: Thought content normal.        Judgment: Judgment normal.       Assessment & Plan:   1. Acute midline low back pain without sciatica   2. Healthcare maintenance   3. Essential hypertension     Acute lumbar back pain Please take Tizanidine 2mg  capsule every 8 hrs as needed for muscle spasm. Follow care plan  Healthcare maintenance Please check blood pressure several times per week, if consistently >140/90 or <100/60- please call clinic. If back pain persists for another 2 weeks, please call clinic. Continue to social distance and wear a mask when in public.  Essential hypertension Please check blood pressure several times per week, if consistently >140/90 or <100/60- please call clinic. If back pain persists for another 2 weeks, please call clinic. Continue to social distance and wear a mask when in public.    FOLLOW-UP:  Return if symptoms worsen or fail to improve.

## 2018-11-03 ENCOUNTER — Encounter: Payer: Self-pay | Admitting: Adult Health

## 2018-11-03 ENCOUNTER — Other Ambulatory Visit: Payer: Self-pay

## 2018-11-03 ENCOUNTER — Ambulatory Visit (INDEPENDENT_AMBULATORY_CARE_PROVIDER_SITE_OTHER): Payer: Medicare Other | Admitting: Adult Health

## 2018-11-03 DIAGNOSIS — M545 Low back pain, unspecified: Secondary | ICD-10-CM

## 2018-11-03 DIAGNOSIS — I10 Essential (primary) hypertension: Secondary | ICD-10-CM

## 2018-11-03 DIAGNOSIS — Z0000 Encounter for general adult medical examination without abnormal findings: Secondary | ICD-10-CM

## 2018-11-03 MED ORDER — TIZANIDINE HCL 2 MG PO CAPS: 2.0000 mg | ORAL_CAPSULE | Freq: Three times a day (TID) | ORAL | 0 refills | Status: DC

## 2018-11-03 NOTE — Assessment & Plan Note (Signed)
Please take Tizanidine 2mg  capsule every 8 hrs as needed for muscle spasm. Follow care plan

## 2018-11-03 NOTE — Assessment & Plan Note (Signed)
Please check blood pressure several times per week, if consistently >140/90 or <100/60- please call clinic. If back pain persists for another 2 weeks, please call clinic. Continue to social distance and wear a mask when in public.

## 2018-11-03 NOTE — Patient Instructions (Addendum)
Acute Back Pain, Adult Acute back pain is sudden and usually short-lived. It is often caused by an injury to the muscles and tissues in the back. The injury may result from:  A muscle or ligament getting overstretched or torn (strained). Ligaments are tissues that connect bones to each other. Lifting something improperly can cause a back strain.  Wear and tear (degeneration) of the spinal disks. Spinal disks are circular tissue that provides cushioning between the bones of the spine (vertebrae).  Twisting motions, such as while playing sports or doing yard work.  A hit to the back.  Arthritis. You may have a physical exam, lab tests, and imaging tests to find the cause of your pain. Acute back pain usually goes away with rest and home care. Follow these instructions at home: Managing pain, stiffness, and swelling  Take over-the-counter and prescription medicines only as told by your health care provider.  Your health care provider may recommend applying ice during the first 24-48 hours after your pain starts. To do this: ? Put ice in a plastic bag. ? Place a towel between your skin and the bag. ? Leave the ice on for 20 minutes, 2-3 times a day.  If directed, apply heat to the affected area as often as told by your health care provider. Use the heat source that your health care provider recommends, such as a moist heat pack or a heating pad. ? Place a towel between your skin and the heat source. ? Leave the heat on for 20-30 minutes. ? Remove the heat if your skin turns bright red. This is especially important if you are unable to feel pain, heat, or cold. You have a greater risk of getting burned. Activity   Do not stay in bed. Staying in bed for more than 1-2 days can delay your recovery.  Sit up and stand up straight. Avoid leaning forward when you sit, or hunching over when you stand. ? If you work at a desk, sit close to it so you do not need to lean over. Keep your chin tucked  in. Keep your neck drawn back, and keep your elbows bent at a right angle. Your arms should look like the letter "L." ? Sit high and close to the steering wheel when you drive. Add lower back (lumbar) support to your car seat, if needed.  Take short walks on even surfaces as soon as you are able. Try to increase the length of time you walk each day.  Do not sit, drive, or stand in one place for more than 30 minutes at a time. Sitting or standing for long periods of time can put stress on your back.  Do not drive or use heavy machinery while taking prescription pain medicine.  Use proper lifting techniques. When you bend and lift, use positions that put less stress on your back: ? Bend your knees. ? Keep the load close to your body. ? Avoid twisting.  Exercise regularly as told by your health care provider. Exercising helps your back heal faster and helps prevent back injuries by keeping muscles strong and flexible.  Work with a physical therapist to make a safe exercise program, as recommended by your health care provider. Do any exercises as told by your physical therapist. Lifestyle  Maintain a healthy weight. Extra weight puts stress on your back and makes it difficult to have good posture.  Avoid activities or situations that make you feel anxious or stressed. Stress and anxiety increase muscle   tension and can make back pain worse. Learn ways to manage anxiety and stress, such as through exercise. General instructions  Sleep on a firm mattress in a comfortable position. Try lying on your side with your knees slightly bent. If you lie on your back, put a pillow under your knees.  Follow your treatment plan as told by your health care provider. This may include: ? Cognitive or behavioral therapy. ? Acupuncture or massage therapy. ? Meditation or yoga. Contact a health care provider if:  You have pain that is not relieved with rest or medicine.  You have increasing pain going down  into your legs or buttocks.  Your pain does not improve after 2 weeks.  You have pain at night.  You lose weight without trying.  You have a fever or chills. Get help right away if:  You develop new bowel or bladder control problems.  You have unusual weakness or numbness in your arms or legs.  You develop nausea or vomiting.  You develop abdominal pain.  You feel faint. Summary  Acute back pain is sudden and usually short-lived.  Use proper lifting techniques. When you bend and lift, use positions that put less stress on your back.  Take over-the-counter and prescription medicines and apply heat or ice as directed by your health care provider. This information is not intended to replace advice given to you by your health care provider. Make sure you discuss any questions you have with your health care provider. Document Released: 02/25/2005 Document Revised: 06/16/2018 Document Reviewed: 10/09/2016 Elsevier Patient Education  [place].   Please take Tizanidine 2mg  capsule every 8 hrs as needed for muscle spasm. Please check blood pressure several times per week, if consistently >140/90 or <100/60- please call clinic. If back pain persists for another 2 weeks, please call clinic. Continue to social distance and wear a mask when in public. FEEL BETTER!

## 2018-11-04 ENCOUNTER — Other Ambulatory Visit: Payer: Self-pay | Admitting: Adult Health

## 2018-11-04 ENCOUNTER — Ambulatory Visit: Payer: Medicare Other | Admitting: Adult Health

## 2018-11-10 ENCOUNTER — Other Ambulatory Visit: Payer: Medicare Other

## 2018-11-10 ENCOUNTER — Encounter: Payer: Self-pay | Admitting: Gastroenterology

## 2018-11-18 ENCOUNTER — Encounter: Payer: Self-pay | Admitting: Adult Health

## 2018-12-01 ENCOUNTER — Other Ambulatory Visit: Payer: Self-pay | Admitting: Adult Health

## 2018-12-01 ENCOUNTER — Other Ambulatory Visit: Payer: Self-pay

## 2018-12-01 ENCOUNTER — Encounter: Payer: Self-pay | Admitting: Gastroenterology

## 2018-12-01 ENCOUNTER — Ambulatory Visit (AMBULATORY_SURGERY_CENTER): Payer: Self-pay

## 2018-12-01 VITALS — Temp 96.2°F | Ht 61.5 in | Wt 144.4 lb

## 2018-12-01 DIAGNOSIS — Z85038 Personal history of other malignant neoplasm of large intestine: Secondary | ICD-10-CM

## 2018-12-01 DIAGNOSIS — Z86010 Personal history of colonic polyps: Secondary | ICD-10-CM

## 2018-12-01 MED ORDER — PEG 3350-KCL-NA BICARB-NACL 420 G PO SOLR: 4000.0000 mL | Freq: Once | ORAL | 0 refills | Status: AC

## 2018-12-01 NOTE — Progress Notes (Signed)
Denies allergies to eggs or soy products. Denies complication of anesthesia or sedation. Denies use of weight loss medication. Denies use of O2.   Emmi instructions given for colonoscopy.  

## 2018-12-15 ENCOUNTER — Other Ambulatory Visit: Payer: Self-pay

## 2018-12-15 ENCOUNTER — Ambulatory Visit (AMBULATORY_SURGERY_CENTER): Payer: Medicare Other | Admitting: Gastroenterology

## 2018-12-15 ENCOUNTER — Other Ambulatory Visit: Payer: Self-pay | Admitting: Gastroenterology

## 2018-12-15 ENCOUNTER — Encounter: Payer: Self-pay | Admitting: Gastroenterology

## 2018-12-15 VITALS — BP 109/61 | HR 72 | Temp 98.5°F | Resp 18 | Ht 61.0 in | Wt 144.0 lb

## 2018-12-15 DIAGNOSIS — Z86010 Personal history of colonic polyps: Secondary | ICD-10-CM | POA: Diagnosis not present

## 2018-12-15 DIAGNOSIS — D123 Benign neoplasm of transverse colon: Secondary | ICD-10-CM | POA: Diagnosis not present

## 2018-12-15 DIAGNOSIS — D125 Benign neoplasm of sigmoid colon: Secondary | ICD-10-CM | POA: Diagnosis not present

## 2018-12-15 DIAGNOSIS — D122 Benign neoplasm of ascending colon: Secondary | ICD-10-CM | POA: Diagnosis not present

## 2018-12-15 DIAGNOSIS — D124 Benign neoplasm of descending colon: Secondary | ICD-10-CM

## 2018-12-15 MED ORDER — SODIUM CHLORIDE 0.9 % IV SOLN: 500.0000 mL | Freq: Once | INTRAVENOUS | Status: DC

## 2018-12-15 NOTE — Progress Notes (Signed)
Called to room to assist during endoscopic procedure.  Patient ID and intended procedure confirmed with present staff. Received instructions for my participation in the procedure from the performing physician.  

## 2018-12-15 NOTE — Progress Notes (Signed)
Report to PACU, RN, vss, BBS= Clear.  

## 2018-12-15 NOTE — Patient Instructions (Signed)
   AWAIT PATHOLOGY RESULTS ON POLYPS REMOVED   HANDOUT ON POLYPS GIVEN TO YOU TODAY   YOU HAD AN ENDOSCOPIC PROCEDURE TODAY AT [facility]:   Refer to the procedure report that was given to you for any specific questions about what was found during the examination.  If the procedure report does not answer your questions, please call your gastroenterologist to clarify.  If you requested that your care partner not be given the details of your procedure findings, then the procedure report has been included in a sealed envelope for you to review at your convenience later.  YOU SHOULD EXPECT: Some feelings of bloating in the abdomen. Passage of more gas than usual.  Walking can help get rid of the air that was put into your GI tract during the procedure and reduce the bloating. If you had a lower endoscopy (such as a colonoscopy or flexible sigmoidoscopy) you may notice spotting of blood in your stool or on the toilet paper. If you underwent a bowel prep for your procedure, you may not have a normal bowel movement for a few days.  Please Note:  You might notice some irritation and congestion in your nose or some drainage.  This is from the oxygen used during your procedure.  There is no need for concern and it should clear up in a day or so.  SYMPTOMS TO REPORT IMMEDIATELY:   Following lower endoscopy (colonoscopy or flexible sigmoidoscopy):  Excessive amounts of blood in the stool  Significant tenderness or worsening of abdominal pains  Swelling of the abdomen that is new, acute  Fever of 100F or higher    Black, tarry-looking stools  For urgent or emergent issues, a gastroenterologist can be reached at any hour by calling [phone].   DIET:  We do recommend a small meal at first, but then you may proceed to your regular diet.  Drink plenty of fluids but you should avoid alcoholic beverages for 24 hours.  ACTIVITY:  You should plan to take it easy for the rest of today  and you should NOT DRIVE or use heavy machinery until tomorrow (because of the sedation medicines used during the test).    FOLLOW UP: Our staff will call the number listed on your records 48-72 hours following your procedure to check on you and address any questions or concerns that you may have regarding the information given to you following your procedure. If we do not reach you, we will leave a message.  We will attempt to reach you two times.  During this call, we will ask if you have developed any symptoms of COVID 19. If you develop any symptoms (ie: fever, flu-like symptoms, shortness of breath, cough etc.) before then, please call [phone].  If you test positive for Covid 19 in the 2 weeks post procedure, please call and report this information to [place].    If any biopsies were taken you will be contacted by phone or by letter within the next 1-3 weeks.  Please call us at [phone] if you have not heard about the biopsies in 3 weeks.    SIGNATURES/CONFIDENTIALITY: You and/or your care partner have signed paperwork which will be entered into your electronic medical record.  These signatures attest to the fact that that the information above on your After Visit Summary has been reviewed and is understood.  Full responsibility of the confidentiality of this discharge information lies with you and/or your care-partner.

## 2018-12-15 NOTE — Op Note (Signed)
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 Colonoscoy 2017 four subCM                            adenomas removed. Medicines:                Monitored Anesthesia Care Procedure:                Pre-Anesthesia Assessment:                           - Prior to the procedure, a History and Physical                            was performed, and patient medications and                            allergies were reviewed. The patient's tolerance of                            previous anesthesia was also reviewed. The risks                            and benefits of the procedure and the sedation                            options and risks were discussed with the patient.                            All questions were answered, and informed consent                            was obtained. Prior Anticoagulants: The patient has                            taken no previous anticoagulant or antiplatelet  agents. ASA Grade Assessment: II - A patient with                            mild systemic disease. After reviewing the risks                            and benefits, the patient was deemed in                            satisfactory condition to undergo the procedure.                           After obtaining informed consent, the colonoscope                            was passed under direct vision. Throughout the                      procedure, the patient's blood pressure, pulse, and                            oxygen saturations were monitored continuously. The                            Colonoscope was introduced through the anus and                            advanced to the the cecum, identified by                            appendiceal orifice and ileocecal valve. The                            colonoscopy was performed without difficulty. The                            patient tolerated the procedure well. The quality                            of the bowel preparation was good. The ileocecal                            valve, appendiceal orifice, and rectum were                            photographed. Scope In: 10:38:51 AM Scope Out: 10:55:01 AM Scope Withdrawal Time: 0 hours 12 minutes 44 seconds  Total Procedure Duration: 0 hours 16 minutes 10 seconds  Findings:                 Fourteen sessile polyps were found in the sigmoid                            colon, descending colon, transverse colon and  ascending colon. The polyps were 4 to 9 mm in size.                            These polyps were removed with a cold snare.                            Resection and retrieval were complete.                           The exam was otherwise without abnormality on                            direct and retroflexion views. Complications:            No immediate complications. Estimated blood loss:                            None. Estimated Blood Loss:     Estimated blood loss: none. Impression:               - Fourteen 4 to 9 mm polyps in the sigmoid colon,                            in the descending colon, in the transverse colon                            and in the ascending colon, removed with a cold                            snare. Resected and retrieved.                           - The examination was otherwise normal on direct                            and  retroflexion views. Recommendation:           - Patient has a contact number available for                            emergencies. The signs and symptoms of potential                            delayed complications were discussed with the                            patient. Return to normal activities tomorrow.                            Written discharge instructions were provided to the                            patient.                           -  Resume previous diet.                           - Continue present medications.                           - Await pathology results. Milus Banister, MD 12/15/2018 11:00:33 AM This report has been signed electronically.

## 2018-12-17 ENCOUNTER — Telehealth: Payer: Self-pay | Admitting: *Deleted

## 2018-12-17 ENCOUNTER — Telehealth: Payer: Self-pay

## 2018-12-17 NOTE — Telephone Encounter (Signed)
Second follow up call attempt.  Message left previously today to call with any questions or concerns.

## 2018-12-17 NOTE — Telephone Encounter (Signed)
LVM

## 2018-12-18 ENCOUNTER — Other Ambulatory Visit: Payer: Self-pay

## 2018-12-18 MED ORDER — PROPRANOLOL HCL 20 MG PO TABS: 20.0000 mg | ORAL_TABLET | Freq: Three times a day (TID) | ORAL | 3 refills | Status: DC

## 2018-12-21 ENCOUNTER — Encounter: Payer: Self-pay | Admitting: Gastroenterology

## 2018-12-22 ENCOUNTER — Other Ambulatory Visit: Payer: Self-pay | Admitting: Adult Health

## 2018-12-25 ENCOUNTER — Other Ambulatory Visit: Payer: Self-pay | Admitting: Adult Health

## 2019-01-07 ENCOUNTER — Other Ambulatory Visit: Payer: Self-pay | Admitting: Adult Health

## 2019-01-14 LAB — HM MAMMOGRAPHY

## 2019-01-27 NOTE — Progress Notes (Addendum)
Subjective:    Patient ID: Glenda Stevens, female    DOB: [birth year], 73 y.o.   MRN: [id number]  HPI:Ms. Amoah is here for 4 months f/u: HTN, GAD, HLD She reports medication compliance, denies SE  Currently on Amlodipine for 5mg ,Propanolol 20mg  TID (for rate control and anxiety). 07/07/2017 She was concerned when her SBP was 110, so she stopped taking Candesartan 32mg  She was started on this ARB after renal stent placed, 10-15 years ago Will stop CCB, re-start ARB She denies acute cardiac sx's Ambulatory BP SBP 120-130s DBP 70-80 HR 60-80s  She was started on Atorvastatin 20mg  July 2020- LDL significantly elevated at 225 She denies myalgia's    Needs LDCT-agreeable to imaging study Continues to smoke pack/day- declined smoking cessation  Concerned about declined memory- MMSE 29/30 today- referral placed to Neurology  Needs hepatic fx/lipids  Patient Care Team    Relationship Specialty Notifications Start End  [name] D, NP PCP - General Family Medicine  [id number]   Leighton Ruff, [place] Physician General Surgery  10/24/14   Kyung Rudd, MD Consulting Physician Radiation Oncology  10/24/14   Milus Banister, MD Attending Physician Gastroenterology  10/01/18     Patient Active Problem List   Diagnosis Date Noted  . Acute lumbar back pain 11/03/2018  . Blood glucose elevated 07/16/2018  . Syncope and collapse 06/03/2018  . Abnormal urinalysis 06/03/2018  . Dizziness 06/03/2018  . Poor memory 06/03/2018  . Family history of dementia 06/03/2018  . Healthcare maintenance 05/01/2017  . Corneal abrasion, right 10/17/2016  . Vitamin D deficiency 07/18/2016  . Other fatigue 05/09/2016  . Bilateral hip pain 05/09/2016  . Tinea corporis 05/09/2016  . Foot pain, bilateral 05/09/2016  . Medicare annual wellness visit, initial 07/06/2015  . Anxiety 07/06/2015  . Rectal cancer ([place]) 08/01/2014  . Diarrhea 07/08/2014  . Seborrheic keratoses 03/18/2014  . Nasal  congestion 09/14/2013  . Smoker 09/14/2013  . Renal artery stenosis ([place]) 08/06/2013  . Hyperlipidemia 08/06/2013  . Essential hypertension 08/06/2013     Past Medical History:  Diagnosis Date  . Allergy    Hay fever  . Anxiety   . Arthritis   . Cataract   . Colon cancer ([place]) 08/01/14   rectal squamous cell ca  . GERD (gastroesophageal reflux disease)   . History of chicken pox   . Hyperlipidemia   . Hypertension   . Phlebitis and thrombophlebitis   . PONV (postoperative nausea and vomiting)   . Renal artery stenosis ([facility])    left stent in 2003  . Renovascular hypertension   . 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 ABDOMINAL HYSTERECTOMY    . BREAST SURGERY     bilateral lumpectomy-benign  . CATARACT EXTRACTION Bilateral   . COLON RESECTION N/A 08/18/2014   Procedure: LAPAROSCOPIC ASSISTED  COLON POLYP RESECTION , DIAGNOSTIC LAPROSCOPY ;  Surgeon: Leighton Ruff, MD;  Location: [facility];  Service: General;  Laterality: N/A;  . NASAL SINUS SURGERY     patient denies having had nasal sinus surgery  . NM MYOCAR PERF WALL MOTION  09/2009   bruce myoview - normal pattern of perfusion, EF 83%, low risk scan  . RENAL ARTERY ANGIOPLASTY  07/02/2001   6x78mm Genesis on Aviator balloon stent overlapping a 6x32mm Genesist on Aviator balloon stent (Dr. Marella Chimes)  . RENAL ARTERY STENT    . RENAL DOPPLER  09/2012  left renal artery stent - 60-99% diameter reduction  . TRANSTHORACIC ECHOCARDIOGRAM  06/2012   EF 55-60%, mild MR     Family History  Problem Relation Age of Onset  . Hypertension Mother   . Heart disease Mother   . Dementia Mother   . Hyperlipidemia Mother   . Heart disease Father   . Hyperlipidemia Father   . Cancer Father        lung cancer and prostate cancer   . Hypertension Father   . Breast cancer Cousin   . Cancer Cousin        4 paternal and 1 maternal cousins had breast cancer    . Healthy Brother   . Colon cancer Neg Hx   . Esophageal cancer Neg Hx   . Stomach cancer Neg Hx   . Rectal cancer Neg Hx      Social History   Substance and Sexual Activity  Drug Use No     Social History   Substance and Sexual Activity  Alcohol Use Yes  . Alcohol/week: 7.0 standard drinks  . Types: 7 Glasses of wine per week     Social History   Tobacco Use  Smoking Status Current Some Day Smoker  . Packs/day: 1.00  . Years: 30.00  . Pack years: 30.00  . Types: Cigarettes  Smokeless Tobacco Never Used     Outpatient Encounter Medications as of 01/28/2019  Medication Sig  . aspirin EC 81 MG tablet Take 81 mg by mouth daily.  Marland Kitchen atorvastatin (LIPITOR) 20 MG tablet Take 1 tablet (20 mg total) by mouth daily.  . cholecalciferol (VITAMIN D) 1000 units tablet Take 1,000 Units by mouth daily.  Marland Kitchen ketoconazole (NIZORAL) 2 % shampoo APPLY TOPICALLY TWICE A WEEK  . Multiple Vitamin (MULTIVITAMIN) tablet Take 1 tablet by mouth daily.  . propranolol (INDERAL) 20 MG tablet Take 1 tablet (20 mg total) by mouth 3 (three) times daily.  . sertraline (ZOLOFT) 100 MG tablet Take 100 mg by mouth daily.   . tizanidine (ZANAFLEX) 2 MG capsule Take 1 capsule (2 mg total) by mouth 3 (three) times daily.  . [DISCONTINUED] amLODipine (NORVASC) 5 MG tablet TAKE 1 TABLET BY MOUTH  DAILY  . candesartan (ATACAND) 8 MG tablet Take 1 tablet (8 mg total) by mouth daily.   No facility-administered encounter medications on file as of 01/28/2019.     Allergies: Patient has no known allergies.  Body mass index is 27.34 kg/m.  Blood pressure 118/74, pulse 70, temperature 97.8 F (36.6 C), temperature source Oral, height 5' 1.5" (1.562 m), weight 147 lb 1.6 oz (66.7 kg), SpO2 99 %.  Review of Systems  Constitutional: Negative for activity change, appetite change, diaphoresis, fatigue, fever and unexpected weight change.  HENT: Negative for congestion.   Respiratory: Negative for cough,  chest tightness, shortness of breath, wheezing and stridor.   Cardiovascular: Negative for chest pain, palpitations and leg swelling.  Gastrointestinal: Negative for abdominal distention, anal bleeding, blood in stool, constipation, diarrhea and nausea.  Endocrine: Negative for polydipsia, polyphagia and polyuria.  Genitourinary: Negative for difficulty urinating and flank pain.  Musculoskeletal: Positive for arthralgias. Negative for myalgias.  Neurological: Negative for dizziness and headaches.  Hematological: Negative for adenopathy. Does not bruise/bleed easily.  Psychiatric/Behavioral: Negative for agitation, behavioral problems, confusion, decreased concentration, dysphoric mood, hallucinations, self-injury, sleep disturbance and suicidal ideas. The patient is not nervous/anxious and is not hyperactive.        Objective:   Physical Exam Constitutional:  Appearance: Normal appearance. She is normal weight.  HENT:     Head: Normocephalic.  Cardiovascular:     Rate and Rhythm: Normal rate and regular rhythm.     Pulses: Normal pulses.     Heart sounds: Normal heart sounds. No murmur. No friction rub. No gallop.   Pulmonary:     Effort: Pulmonary effort is normal. No respiratory distress.     Breath sounds: Normal breath sounds. No stridor. No wheezing, rhonchi or rales.  Chest:     Chest wall: No tenderness.  Skin:    General: Skin is warm and dry.     Capillary Refill: Capillary refill takes less than 2 seconds.  Neurological:     Mental Status: She is alert and oriented to person, place, and time.     Coordination: Coordination normal.  Psychiatric:        Mood and Affect: Mood normal.        Behavior: Behavior normal.        Thought Content: Thought content normal.        Judgment: Judgment normal.       Assessment & Plan:   1. Decline in verbal memory   2. Healthcare maintenance   3. Hyperlipidemia, unspecified hyperlipidemia type   4. On statin therapy   5.  Essential hypertension   6. Poor memory     Healthcare maintenance Stop Amlodipine. Start Candesartan 8mg  once daily. Continue Propranolol 20mg  three times daily. Please check blood pressure and heart rate daily. Low Dose CT ordered- will call you with the results. Remain well hydrated and follow heart healthy diet. Referral to Neurology placed, re: memory decline. Continue to social distance and wear a mask when in public.  Hyperlipidemia Currently on Atorvastatin 20mg  QD, denies myalgia's today Hepatic Fx panel and lipids drawn today  Essential hypertension Stop Amlodipine. Start Candesartan 8mg  once daily. Continue Propranolol 20mg  three times daily. Please check blood pressure and heart rate daily Ambulatory BP SBP 120-130s DBP 70-80 HR 60-80s   Poor memory Concerned about declined memory- MMSE 29/30 today- referral placed to Neurology     FOLLOW-UP:  Return in about 3 months (around 04/30/2019) for Regular Follow Up, HTN, Hypercholestermia.

## 2019-01-28 ENCOUNTER — Encounter: Payer: Self-pay | Admitting: Adult Health

## 2019-01-28 ENCOUNTER — Ambulatory Visit (INDEPENDENT_AMBULATORY_CARE_PROVIDER_SITE_OTHER): Payer: Medicare Other | Admitting: Adult Health

## 2019-01-28 ENCOUNTER — Other Ambulatory Visit: Payer: Self-pay

## 2019-01-28 VITALS — BP 118/74 | HR 70 | Temp 97.8°F | Ht 61.5 in | Wt 147.1 lb

## 2019-01-28 DIAGNOSIS — Z0000 Encounter for general adult medical examination without abnormal findings: Secondary | ICD-10-CM

## 2019-01-28 DIAGNOSIS — R413 Other amnesia: Secondary | ICD-10-CM

## 2019-01-28 DIAGNOSIS — I10 Essential (primary) hypertension: Secondary | ICD-10-CM

## 2019-01-28 DIAGNOSIS — Z79899 Other long term (current) drug therapy: Secondary | ICD-10-CM | POA: Diagnosis not present

## 2019-01-28 DIAGNOSIS — E785 Hyperlipidemia, unspecified: Secondary | ICD-10-CM

## 2019-01-28 MED ORDER — CANDESARTAN CILEXETIL 8 MG PO TABS: 8.0000 mg | ORAL_TABLET | Freq: Every day | ORAL | 0 refills | Status: DC

## 2019-01-28 NOTE — Assessment & Plan Note (Signed)
Stop Amlodipine. Start Candesartan 8mg  once daily. Continue Propranolol 20mg  three times daily. Please check blood pressure and heart rate daily. Low Dose CT ordered- will call you with the results. Remain well hydrated and follow heart healthy diet. Referral to Neurology placed, re: memory decline. Continue to social distance and wear a mask when in public.

## 2019-01-28 NOTE — Assessment & Plan Note (Addendum)
Stop Amlodipine. Start Candesartan 8mg  once daily. Continue Propranolol 20mg  three times daily. Please check blood pressure and heart rate daily Ambulatory BP SBP 120-130s DBP 70-80 HR 60-80s

## 2019-01-28 NOTE — Assessment & Plan Note (Signed)
Currently on Atorvastatin 20mg  QD, denies myalgia's today Hepatic Fx panel and lipids drawn today

## 2019-01-28 NOTE — Patient Instructions (Addendum)
Managing Your Hypertension Hypertension is commonly called high blood pressure. This is when the force of your blood pressing against the walls of your arteries is too strong. Arteries are blood vessels that carry blood from your heart throughout your body. Hypertension forces the heart to work harder to pump blood, and may cause the arteries to become narrow or stiff. Having untreated or uncontrolled hypertension can cause heart attack, stroke, kidney disease, and other problems. What are blood pressure readings? A blood pressure reading consists of a higher number over a lower number. Ideally, your blood pressure should be below 120/80. The first ("top") number is called the systolic pressure. It is a measure of the pressure in your arteries as your heart beats. The second ("bottom") number is called the diastolic pressure. It is a measure of the pressure in your arteries as the heart relaxes. What does my blood pressure reading mean? Blood pressure is classified into four stages. Based on your blood pressure reading, your health care provider may use the following stages to determine what type of treatment you need, if any. Systolic pressure and diastolic pressure are measured in a unit called mm Hg. Normal  Systolic pressure: below 784.  Diastolic pressure: below 80. Elevated  Systolic pressure: 696-295.  Diastolic pressure: below 80. Hypertension stage 1  Systolic pressure: 284-132.  Diastolic pressure: 44-01. Hypertension stage 2  Systolic pressure: 027 or above.  Diastolic pressure: 90 or above. What health risks are associated with hypertension? Managing your hypertension is an important responsibility. Uncontrolled hypertension can lead to:  A heart attack.  A stroke.  A weakened blood vessel (aneurysm).  Heart failure.  Kidney damage.  Eye damage.  Metabolic syndrome.  Memory and concentration problems. What changes can I make to manage my hypertension?  Hypertension can be managed by making lifestyle changes and possibly by taking medicines. Your health care provider will help you make a plan to bring your blood pressure within a normal range. Eating and drinking   Eat a diet that is high in fiber and potassium, and low in salt (sodium), added sugar, and fat. An example eating plan is called the DASH (Dietary Approaches to Stop Hypertension) diet. To eat this way: ? Eat plenty of fresh fruits and vegetables. Try to fill half of your plate at each meal with fruits and vegetables. ? Eat whole grains, such as whole wheat pasta, brown rice, or whole grain bread. Fill about one quarter of your plate with whole grains. ? Eat low-fat diary products. ? Avoid fatty cuts of meat, processed or cured meats, and poultry with skin. Fill about one quarter of your plate with lean proteins such as fish, chicken without skin, beans, eggs, and tofu. ? Avoid premade and processed foods. These tend to be higher in sodium, added sugar, and fat.  Reduce your daily sodium intake. Most people with hypertension should eat less than 1,500 mg of sodium a day.  Limit alcohol intake to no more than 1 drink a day for nonpregnant women and 2 drinks a day for men. One drink equals 12 oz of beer, 5 oz of wine, or 1 oz of hard liquor. Lifestyle  Work with your health care provider to maintain a healthy body weight, or to lose weight. Ask what an ideal weight is for you.  Get at least 30 minutes of exercise that causes your heart to beat faster (aerobic exercise) most days of the week. Activities may include walking, swimming, or biking.  Include exercise  to strengthen your muscles (resistance exercise), such as weight lifting, as part of your weekly exercise routine. Try to do these types of exercises for 30 minutes at least 3 days a week.  Do not use any products that contain nicotine or tobacco, such as cigarettes and e-cigarettes. If you need help quitting, ask your health  care provider.  Control any long-term (chronic) conditions you have, such as high cholesterol or diabetes. Monitoring  Monitor your blood pressure at home as told by your health care provider. Your personal target blood pressure may vary depending on your medical conditions, your age, and other factors.  Have your blood pressure checked regularly, as often as told by your health care provider. Working with your health care provider  Review all the medicines you take with your health care provider because there may be side effects or interactions.  Talk with your health care provider about your diet, exercise habits, and other lifestyle factors that may be contributing to hypertension.  Visit your health care provider regularly. Your health care provider can help you create and adjust your plan for managing hypertension. Will I need medicine to control my blood pressure? Your health care provider may prescribe medicine if lifestyle changes are not enough to get your blood pressure under control, and if:  Your systolic blood pressure is 130 or higher.  Your diastolic blood pressure is 80 or higher. Take medicines only as told by your health care provider. Follow the directions carefully. Blood pressure medicines must be taken as prescribed. The medicine does not work as well when you skip doses. Skipping doses also puts you at risk for problems. Contact a health care provider if:  You think you are having a reaction to medicines you have taken.  You have repeated (recurrent) headaches.  You feel dizzy.  You have swelling in your ankles.  You have trouble with your vision. Get help right away if:  You develop a severe headache or confusion.  You have unusual weakness or numbness, or you feel faint.  You have severe pain in your chest or abdomen.  You vomit repeatedly.  You have trouble breathing. Summary  Hypertension is when the force of blood pumping through your arteries  is too strong. If this condition is not controlled, it may put you at risk for serious complications.  Your personal target blood pressure may vary depending on your medical conditions, your age, and other factors. For most people, a normal blood pressure is less than 120/80.  Hypertension is managed by lifestyle changes, medicines, or both. Lifestyle changes include weight loss, eating a healthy, low-sodium diet, exercising more, and limiting alcohol. This information is not intended to replace advice given to you by your health care provider. Make sure you discuss any questions you have with your health care provider. Document Released: 11/20/2011 Document Revised: 06/19/2018 Document Reviewed: 01/24/2016 Elsevier Patient Education  [place].  Stop Amlodipine. Start Candesartan 8mg  once daily. Continue Propranolol 20mg  three times daily. Please check blood pressure and heart rate daily. Low Dose CT ordered- will call you with the results. Remain well hydrated and follow heart healthy diet. Referral to Neurology placed, re: memory decline. Continue to social distance and wear a mask when in public. Follow-up 3 months.

## 2019-01-29 ENCOUNTER — Encounter: Payer: Self-pay | Admitting: Adult Health

## 2019-01-29 LAB — HEPATIC FUNCTION PANEL
ALT: 22 IU/L (ref 0–32)
AST: 27 IU/L (ref 0–40)
Albumin: 4.5 g/dL (ref 3.7–4.7)
Alkaline Phosphatase: 70 IU/L (ref 39–117)
Bilirubin Total: 0.3 mg/dL (ref 0.0–1.2)
Bilirubin, Direct: 0.1 mg/dL (ref 0.00–0.40)
Total Protein: 6.6 g/dL (ref 6.0–8.5)

## 2019-01-29 LAB — LIPID PANEL
Chol/HDL Ratio: 2.8 ratio (ref 0.0–4.4)
Cholesterol, Total: 193 mg/dL (ref 100–199)
HDL: 70 mg/dL (ref >39)
LDL Chol Calc (NIH): 106 mg/dL — ABNORMAL HIGH (ref 0–99)
Triglycerides: 95 mg/dL (ref 0–149)
VLDL Cholesterol Cal: 17 mg/dL (ref 5–40)

## 2019-01-29 NOTE — Assessment & Plan Note (Signed)
Concerned about declined memory- MMSE 29/30 today- referral placed to Neurology

## 2019-02-16 NOTE — Progress Notes (Signed)
Received Epic notification that pt has not read MyChart message regarding results.     Recent Lab Results  From  Esaw Grandchild, NP To  Kearie Proper Sent and Delivered  01/29/2019 2:47 PM  Good Afternoon Ms. Glenda Stevens,  Your liver function panel is normal.  Your cholesterol panel has drastically improved!  Total-193, reduced from 311 four months ago!  Triglycerides- 95, normal  HDL (good) cholesterol- 70- WONDERFUL  LDL- get ready- 106, down from 225- THAT IS FANTASTIC!  Continue current statin therapy.  So pleased with this improvement!  Sincerely,  [employer] User Last Read On  [name] Not Read        Letter mailed to pt with results.  Charyl Bigger, CMA

## 2019-03-16 ENCOUNTER — Other Ambulatory Visit: Payer: Self-pay | Admitting: Adult Health

## 2019-04-20 ENCOUNTER — Ambulatory Visit: Payer: Medicare Other | Attending: Internal Medicine

## 2019-04-22 ENCOUNTER — Ambulatory Visit: Payer: Self-pay

## 2019-04-28 ENCOUNTER — Other Ambulatory Visit: Payer: Self-pay | Admitting: Adult Health

## 2019-05-03 ENCOUNTER — Ambulatory Visit: Payer: Medicare Other | Admitting: Adult Health

## 2019-05-04 ENCOUNTER — Other Ambulatory Visit: Payer: Self-pay | Admitting: Adult Health

## 2019-05-09 ENCOUNTER — Ambulatory Visit: Payer: Medicare Other | Attending: Internal Medicine

## 2019-05-09 DIAGNOSIS — Z23 Encounter for immunization: Secondary | ICD-10-CM

## 2019-05-09 NOTE — Progress Notes (Signed)
   Covid-19 Vaccination Clinic  Name:  Glenda Stevens    MRN: [id number] DOB: [birth year]  05/09/2019  [name] was observed post Covid-19 immunization for 15 minutes without incidence. She was provided with Vaccine Information Sheet and instruction to access the V-Safe system.   [name] was instructed to call 911 with any severe reactions post vaccine: Marland Kitchen Difficulty breathing  . Swelling of your face and throat  . A fast heartbeat  . A bad rash all over your body  . Dizziness and weakness    Immunizations Administered    Name Date Dose VIS Date Route   Pfizer COVID-19 Vaccine 05/09/2019 10:13 AM 0.3 mL 02/19/2019 Intramuscular   Manufacturer: [place]   Lot: [id number]   [place]: [id number]

## 2019-05-22 ENCOUNTER — Other Ambulatory Visit: Payer: Self-pay | Admitting: Adult Health

## 2019-05-25 ENCOUNTER — Other Ambulatory Visit: Payer: Self-pay | Admitting: Family Medicine

## 2019-05-25 MED ORDER — ATORVASTATIN CALCIUM 20 MG PO TABS: 20.0000 mg | ORAL_TABLET | Freq: Every day | ORAL | 0 refills | Status: DC

## 2019-05-31 ENCOUNTER — Other Ambulatory Visit: Payer: Self-pay | Admitting: Family Medicine

## 2019-05-31 ENCOUNTER — Telehealth: Payer: Self-pay | Admitting: Adult Health

## 2019-05-31 NOTE — Telephone Encounter (Signed)
Pt left msg regarding refill of Rx for only 15 tabs--advised her that OV/Telehealth required for any addt'l refills :  candesartan (ATACAND) 8 MG tablet [id number]   Order Details Dose: 8 mg Route: Oral Frequency: Daily  Dispense Quantity: 15 tablet Refills: 0       Sig: TAKE 1 TABLET (8 MG TOTAL) BY MOUTH DAILY. **PATIENT NEEDS TO BE SEEN FOR FURTHER REFILLS**       --scheduled pt for provider's 1st available 06/22/19-- Pt will be out of meds by then)--FYI to med asst. --glh

## 2019-05-31 NOTE — Telephone Encounter (Signed)
Noted. AS, CMA 

## 2019-06-02 ENCOUNTER — Ambulatory Visit: Payer: Medicare Other | Attending: Internal Medicine

## 2019-06-02 DIAGNOSIS — Z23 Encounter for immunization: Secondary | ICD-10-CM

## 2019-06-02 NOTE — Progress Notes (Signed)
   Covid-19 Vaccination Clinic  Name:  Jonet Hausler    MRN: [id number] DOB: January 05, 1946  06/02/2019  Ms. Hillard was observed post Covid-19 immunization for 15 minutes without incident. She was provided with Vaccine Information Sheet and instruction to access the V-Safe system.   Ms. Bogucki was instructed to call 911 with any severe reactions post vaccine: Marland Kitchen Difficulty breathing  . Swelling of face and throat  . A fast heartbeat  . A bad rash all over body  . Dizziness and weakness   Immunizations Administered    Name Date Dose VIS Date Route   Pfizer COVID-19 Vaccine 06/02/2019  1:35 PM 0.3 mL 02/19/2019 Intramuscular   Manufacturer: [place]   Lot: [id number]   [place]: [id number]

## 2019-06-08 ENCOUNTER — Telehealth: Payer: Self-pay | Admitting: Adult Health

## 2019-06-08 NOTE — Telephone Encounter (Signed)
Patient is aware that she is due for a med f/u for additional refills but is currently out of her BP meds. She is scheduled for 06/22/19 but is worried about going 2 weeks without this med. She is requesting a small refill to get to this OV and would like to discuss it further if needed with clinic staff. If approved please send to [facility].

## 2019-06-08 NOTE — Telephone Encounter (Signed)
LVM for pt to call to discuss.  T. Nariya Neumeyer, CMA  

## 2019-06-09 ENCOUNTER — Other Ambulatory Visit: Payer: Self-pay | Admitting: Family Medicine

## 2019-06-14 ENCOUNTER — Telehealth: Payer: Self-pay | Admitting: Adult Health

## 2019-06-14 NOTE — Telephone Encounter (Signed)
Patient called saying that pharm did not receive her candesartan refill. I advised patient that we sent this order to pharm on 06/09/19 to [facility], she called them and they are still saying they do not have any orders. Can we verify that the refill was placed correctly?

## 2019-06-14 NOTE — Telephone Encounter (Signed)
Spoke with Colletta Maryland at [facility] who states that the patient picked up a 15 day supply of this medication.  Called pt to inform her of this information and she stated that she has not picked up any medications today.  Advised pt to call [facility] and speak to them about this discrepancy.  Charyl Bigger, CMA

## 2019-06-22 ENCOUNTER — Other Ambulatory Visit: Payer: Self-pay

## 2019-06-22 ENCOUNTER — Ambulatory Visit: Payer: Medicare Other | Admitting: Family Medicine

## 2019-06-23 ENCOUNTER — Encounter: Payer: Self-pay | Admitting: Family Medicine

## 2019-06-23 ENCOUNTER — Ambulatory Visit (INDEPENDENT_AMBULATORY_CARE_PROVIDER_SITE_OTHER): Payer: Medicare Other | Admitting: Family Medicine

## 2019-06-23 VITALS — BP 129/81 | HR 71 | Ht 61.5 in | Wt 140.0 lb

## 2019-06-23 DIAGNOSIS — I10 Essential (primary) hypertension: Secondary | ICD-10-CM

## 2019-06-23 DIAGNOSIS — E785 Hyperlipidemia, unspecified: Secondary | ICD-10-CM

## 2019-06-23 DIAGNOSIS — F419 Anxiety disorder, unspecified: Secondary | ICD-10-CM | POA: Diagnosis not present

## 2019-06-23 DIAGNOSIS — C20 Malignant neoplasm of rectum: Secondary | ICD-10-CM

## 2019-06-23 MED ORDER — CANDESARTAN CILEXETIL 8 MG PO TABS: 8.0000 mg | ORAL_TABLET | Freq: Every day | ORAL | 0 refills | Status: AC

## 2019-06-23 MED ORDER — ATORVASTATIN CALCIUM 20 MG PO TABS: 20.0000 mg | ORAL_TABLET | Freq: Every day | ORAL | 0 refills | Status: DC

## 2019-06-23 NOTE — Progress Notes (Signed)
Telehealth office visit note for Glenda Stevens, D.O- at Primary Care at [facility]   I connected with current patient today and verified that I am speaking with the correct person    Location of the patient: Home  Location of the provider: Office - This visit type was conducted due to national recommendations for restrictions regarding the COVID-19 Pandemic (e.g. social distancing) in an effort to limit this patient's exposure and mitigate transmission in our community.    - No physical exam could be performed with this format, beyond that communicated to [place] by the patient/ family members as noted.   - Additionally my office staff/ schedulers were to discuss with the patient that there may be a monetary charge related to this service, depending on their medical insurance.  My understanding is that patient understood and consented to proceed.     _________________________________________________________________________________   History of Present Illness:  I, Glenda Stevens, am serving as Education administrator for [employer].  HPI from Glenda Stevens last [place] 01/28/2019: Glenda Stevens is here for 4 months f/u: HTN, GAD, HLD She reports medication compliance, denies SE  Currently on Amlodipine for 5mg ,Propanolol 20mg  TID (for rate control and anxiety). 07/07/2017 She was concerned when her SBP was 110, so she stopped taking Candesartan 32mg  She was started on this ARB after renal stent placed, 10-15 years ago Will stop CCB, re-start ARB She denies acute cardiac sx's Ambulatory BP SBP 120-130s DBP 70-80 HR 60-80s  She was started on Atorvastatin 20mg  July 2020- LDL significantly elevated at 225 She denies myalgia's   Needs LDCT-agreeable to imaging study Continues to smoke pack/day- declined smoking cessation  Concerned about declined memory- MMSE 29/30 today- referral placed to Neurology  Needs hepatic fx/lipids  HPI 06/23/2019: Patient states today that she is doing well.   Here to review chronic concerns of HTN, GAD, HLD.  HPI:  Hypertension:  She has been doing fine on the candesartan.  -  Her blood pressure at home has been running: usually 120-130/70.  Feels it's continuously measured around this level.  - Patient reports good compliance with medication and/or lifestyle modification  - Her denies acute concerns or problems related to treatment plan  - She denies new onset of: chest pain, exercise intolerance, shortness of breath, dizziness, visual changes, headache, lower extremity swelling or claudication.   Last 3 blood pressure readings in our office are as follows: BP Readings from Last 3 Encounters:  06/23/19 129/81  01/28/19 118/74  12/15/18 109/61   Filed Weights   06/23/19 1359  Weight: 140 lb (63.5 kg)         GAD 7 : Generalized Anxiety Score 07/07/2017  Nervous, Anxious, on Edge 0  Control/stop worrying 1  Worry too much - different things 0  Trouble relaxing 0  Restless 0  Easily annoyed or irritable 0  Afraid - awful might happen 0  Total GAD 7 Score 1  Anxiety Difficulty Not difficult at all    Depression screen [facility] 2/9 01/28/2019 11/03/2018 09/29/2018 06/03/2018 03/31/2018  Decreased Interest 0 1 0 0 0  Down, Depressed, Hopeless 1 0 1 0 0  PHQ - 2 Score 1 1 1  0 0  Altered sleeping 0 0 1 1 1   Tired, decreased energy 0 0 1 0 1  Change in appetite 0 0 1 0 0  Feeling bad or failure about yourself  0 0 0 0 1  Trouble concentrating 1 1 1  0 1  Moving slowly or fidgety/restless 0 0 0 0 0  Suicidal thoughts 0 0 0 0 0  PHQ-9 Score 2 2 5 1 4   Difficult doing work/chores Not difficult at all Somewhat difficult Not difficult at all Not difficult at all Somewhat difficult  Some recent data might be hidden      Impression and Recommendations:     1. Essential hypertension   2. Hyperlipidemia, unspecified hyperlipidemia type   3. Rectal cancer ([place])   4. Anxiety      Of note, this is my first time meeting patient.   Patient is new to me and was previously being cared for at our office by an NP, who no longer works at [facility].   Will be seen by Glenda Reid, PA-C in future.  - Last OV 01/28/2019.  Essential Hypertension - Last OV, stopped amlodipine and began candesartan 8 mg once daily.  - Per patient, BP controlled at home on current medications.  - Blood pressure currently is stable, at goal. - Patient will continue current treatment regimen.  See med list.  - Counseled patient on pathophysiology of disease and discussed various treatment options, which always includes dietary and lifestyle modification as first line.   - Lifestyle changes such as dash and heart healthy diets and engaging in a regular exercise program discussed extensively with patient.   - Ambulatory blood pressure monitoring encouraged at least 3 times weekly.  Keep log and bring in every office visit.  Reminded patient that if they ever feel poorly in any way, to check their blood pressure and pulse.  - We will continue to monitor. - Patient knows to continue to follow up every three months for medication monitoring.   Anxiety - Continue management on propanolol. - Stable at this time. - Continue treatment plan as established. - Will continue to monitor.   Hyperlipidemia - Continue management as established.  See med list. - Will continue to monitor and re-check as discussed.   Recommendations - Recommended return in-person for Medicare Wellness and full fasting blood work same day on or after September 22, 2019.     - As part of my medical decision making, I reviewed the following data within the [place] History obtained from pt /family, CMA notes reviewed and incorporated if applicable, Labs reviewed, Radiograph/ tests reviewed if applicable and OV notes from prior OV's with me, as well as other specialists she/he has seen since seeing me last, were all reviewed and used in my  medical decision making process today.    - Additionally, when appropriate, discussion had with patient regarding our treatment plan, and their biases/concerns about that plan were used in my medical decision making today.    - The patient agreed with the plan and demonstrated an understanding of the instructions.   No barriers to understanding were identified.     - The patient was advised to call back or seek an in-person evaluation if the symptoms worsen or if the condition fails to improve as anticipated.   Return for Medicare Wellness with full fasting lab work same day on or after 09/22/2019.    No orders of the defined types were placed in this encounter.   Meds ordered this encounter  Medications   atorvastatin (LIPITOR) 20 MG tablet    Sig: Take 1 tablet (20 mg total) by mouth at bedtime.    Dispense:  90 tablet    Refill:  0   candesartan (ATACAND)  8 MG tablet    Sig: Take 1 tablet (8 mg total) by mouth daily.    Dispense:  90 tablet    Refill:  0    Medications Discontinued During This Encounter  Medication Reason   aspirin EC 81 MG tablet Patient Preference   cholecalciferol (VITAMIN D) 1000 units tablet Patient Preference   Multiple Vitamin (MULTIVITAMIN) tablet Patient Preference   tizanidine (ZANAFLEX) 2 MG capsule Completed Course   atorvastatin (LIPITOR) 20 MG tablet Reorder   candesartan (ATACAND) 8 MG tablet Reorder       Time spent on visit including pre-visit chart review and post-visit care was 5 minutes.    Note:  This note was prepared with assistance of Dragon voice recognition software. Occasional wrong-word or sound-a-like substitutions may have occurred due to the inherent limitations of voice recognition software.  The [place] was signed into law in 2016 which includes the topic of electronic health records.  This provides immediate access to information in MyChart.  This includes consultation notes, operative notes,  office notes, lab results and pathology reports.  If you have any questions about what you read please let us know at your next visit or call us at the office.  We are right here with you.  This document serves as a record of services personally performed by Glenda Dance, DO. It was created on her behalf by Glenda Stevens, a trained medical scribe. The creation of this record is based on the scribe's personal observations and the provider's statements to them.    The above documentation from Glenda Stevens, medical scribe, has been reviewed by Marjory Sneddon, D.O.    __________________________________________________________________________________     Patient Care Team    Relationship Specialty Notifications Start End  Esaw Grandchild, NP PCP - General Family Medicine  [id number]   Leighton Ruff, MD Consulting Physician General Surgery  10/24/14   Kyung Rudd, MD Consulting Physician Radiation Oncology  10/24/14   Milus Banister, MD Attending Physician Gastroenterology  10/01/18      -Vitals obtained; medications/ allergies reconciled;  personal medical, social, Sx etc.histories were updated by CMA, reviewed by me and are reflected in chart   Patient Active Problem List   Diagnosis Date Noted   Acute lumbar back pain 11/03/2018   Blood glucose elevated 07/16/2018   Syncope and collapse 06/03/2018   Abnormal urinalysis 06/03/2018   Dizziness 06/03/2018   Poor memory 06/03/2018   Family history of dementia 06/03/2018   Healthcare maintenance 05/01/2017   Corneal abrasion, right 10/17/2016   Vitamin D deficiency 07/18/2016   Other fatigue 05/09/2016   Bilateral hip pain 05/09/2016   Tinea corporis 05/09/2016   Foot pain, bilateral 05/09/2016   Medicare annual wellness visit, initial 07/06/2015   Anxiety 07/06/2015   Rectal cancer ([place]) 08/01/2014   Colon cancer ([place]) 08/01/2014   Diarrhea 07/08/2014   Seborrheic keratoses 03/18/2014    Nasal congestion 09/14/2013   Smoker 09/14/2013   Renal artery stenosis ([place]) 08/06/2013   Hyperlipidemia 08/06/2013   Essential hypertension 08/06/2013     Current Meds  Medication Sig   atorvastatin (LIPITOR) 20 MG tablet Take 1 tablet (20 mg total) by mouth at bedtime.   candesartan (ATACAND) 8 MG tablet Take 1 tablet (8 mg total) by mouth daily.   ketoconazole (NIZORAL) 2 % shampoo APPLY TOPICALLY TWICE A WEEK   propranolol (INDERAL) 20 MG tablet Take 1 tablet (20 mg total) by mouth 3 (three) times daily.  sertraline (ZOLOFT) 100 MG tablet Take 100 mg by mouth daily.    [DISCONTINUED] atorvastatin (LIPITOR) 20 MG tablet Take 1 tablet (20 mg total) by mouth daily. **PATIENT NEEDS APT FOR FURTHER REFILLS**   [DISCONTINUED] candesartan (ATACAND) 8 MG tablet TAKE 1 TABLET BY MOUTH DAILY.     Allergies:  No Known Allergies   ROS:  See above HPI for pertinent positives and negatives   Objective:   Blood pressure 129/81, pulse 71, height 5' 1.5" (1.562 m), weight 140 lb (63.5 kg).  (if some vitals are omitted, this means that patient was UNABLE to obtain them even though they were asked to get them prior to OV today.  They were asked to call us at their earliest convenience with these once obtained. ) General: A & O * 3; sounds in no acute distress; in usual state of health.  Skin: Pt confirms warm and dry extremities and pink fingertips HEENT: Pt confirms lips non-cyanotic Chest: Patient confirms normal chest excursion and movement Respiratory: speaking in full sentences, no conversational dyspnea; patient confirms no use of accessory muscles Psych: insight appears good, mood- appears full

## 2019-07-02 ENCOUNTER — Other Ambulatory Visit: Payer: Self-pay | Admitting: Family Medicine

## 2019-07-02 DIAGNOSIS — I10 Essential (primary) hypertension: Secondary | ICD-10-CM

## 2019-08-13 ENCOUNTER — Other Ambulatory Visit: Payer: Self-pay | Admitting: Family Medicine

## 2019-08-13 DIAGNOSIS — E785 Hyperlipidemia, unspecified: Secondary | ICD-10-CM

## 2019-08-23 ENCOUNTER — Other Ambulatory Visit: Payer: Self-pay | Admitting: Physician Assistant

## 2019-08-23 MED ORDER — KETOCONAZOLE 2 % EX SHAM: MEDICATED_SHAMPOO | CUTANEOUS | 2 refills | Status: DC

## 2019-08-26 ENCOUNTER — Other Ambulatory Visit: Payer: Self-pay | Admitting: Family Medicine

## 2019-08-26 DIAGNOSIS — I10 Essential (primary) hypertension: Secondary | ICD-10-CM

## 2019-10-01 ENCOUNTER — Inpatient Hospital Stay: Payer: Medicare Other | Attending: Hematology | Admitting: Hematology

## 2019-10-08 ENCOUNTER — Other Ambulatory Visit: Payer: Self-pay | Admitting: Adult Health

## 2019-10-15 ENCOUNTER — Ambulatory Visit: Payer: Medicare Other | Admitting: Physician Assistant

## 2019-10-19 ENCOUNTER — Ambulatory Visit: Payer: Medicare Other | Admitting: Physician Assistant

## 2019-12-28 ENCOUNTER — Other Ambulatory Visit: Payer: Self-pay | Admitting: Physician Assistant

## 2020-01-13 ENCOUNTER — Other Ambulatory Visit: Payer: Self-pay | Admitting: Physician Assistant

## 2020-01-17 ENCOUNTER — Other Ambulatory Visit: Payer: Self-pay

## 2020-01-17 ENCOUNTER — Encounter: Payer: Self-pay | Admitting: Physician Assistant

## 2020-01-17 ENCOUNTER — Ambulatory Visit (INDEPENDENT_AMBULATORY_CARE_PROVIDER_SITE_OTHER): Payer: Medicare Other | Admitting: Physician Assistant

## 2020-01-17 VITALS — BP 183/82 | HR 70 | Ht 61.5 in | Wt 115.0 lb

## 2020-01-17 DIAGNOSIS — R296 Repeated falls: Secondary | ICD-10-CM | POA: Diagnosis not present

## 2020-01-17 DIAGNOSIS — R634 Abnormal weight loss: Secondary | ICD-10-CM

## 2020-01-17 DIAGNOSIS — E785 Hyperlipidemia, unspecified: Secondary | ICD-10-CM

## 2020-01-17 DIAGNOSIS — R413 Other amnesia: Secondary | ICD-10-CM | POA: Diagnosis not present

## 2020-01-17 DIAGNOSIS — M545 Low back pain, unspecified: Secondary | ICD-10-CM

## 2020-01-17 DIAGNOSIS — I10 Essential (primary) hypertension: Secondary | ICD-10-CM | POA: Diagnosis not present

## 2020-01-17 MED ORDER — ATORVASTATIN CALCIUM 20 MG PO TABS: 20.0000 mg | ORAL_TABLET | Freq: Every day | ORAL | 0 refills | Status: AC

## 2020-01-17 MED ORDER — PROPRANOLOL HCL 20 MG PO TABS: 20.0000 mg | ORAL_TABLET | Freq: Three times a day (TID) | ORAL | 0 refills | Status: AC

## 2020-01-17 NOTE — Progress Notes (Signed)
Established Patient Office Visit  Subjective:  Patient ID: Glenda Stevens, female    DOB: 09/25/1945  Age: 74 y.o. MRN: [id number]  CC:  Chief Complaint  Patient presents with  . Back Pain  . Hypertension    HPI Glenda Stevens presents for follow up on hypertension and has complaint of back pain that comes and goes, which gets better with Ibuprofen. Patient is accompanied by her son. Patient feels like her balance is not the same. Patient's son reports she has had recurrent falls within the past month with no head trauma and patient seems more forgetful. Has noticed patient sometimes forgets to take her medications and is not eating like she should. Reports patient tries to feed the dogs which have been deceased for quite some time now.  States she seems to be experiencing sundowning where she gets confused and does not think she is home. Has not seen Neurologist in the past.  HTN: Pt denies chest pain, palpitations, shortness of breath or lower extremity swelling. Patient does not check BP at home as she should. Patient's son states she found her BP device on the floor, patient thought it was a Christmas light. Pt does not drink enough water, she likes to drink diet pepsi. Patient has been out of blood pressure medication.   Past Medical History:  Diagnosis Date  . Allergy    Hay fever  . Anxiety   . Arthritis   . Cataract   . 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 GERD (gastroesophageal reflux disease)   . History of chicken pox   . Hyperlipidemia   . Hypertension   . Phlebitis and thrombophlebitis   . PONV (postoperative nausea and vomiting)   . Renal artery stenosis ([facility])    left stent in 2003  . Renovascular hypertension   . S/P radiation therapy 09/05/14-10/19/14   anal ca,50.4Gy    Past Surgical History:  Procedure Laterality Date  . ABDOMINAL HYSTERECTOMY    . BREAST SURGERY     bilateral lumpectomy-benign  . CATARACT EXTRACTION Bilateral   . COLON  RESECTION N/A 08/18/2014   Procedure: LAPAROSCOPIC ASSISTED  COLON POLYP RESECTION , DIAGNOSTIC LAPROSCOPY ;  Surgeon: Leighton Ruff, MD;  Location: [facility];  Service: General;  Laterality: N/A;  . NASAL SINUS SURGERY     patient denies having had nasal sinus surgery  . NM MYOCAR PERF WALL MOTION  09/2009   bruce myoview - normal pattern of perfusion, EF 83%, low risk scan  . RENAL ARTERY ANGIOPLASTY  07/02/2001   6x48m Genesis on Aviator balloon stent overlapping a 6x137mGenesist on Aviator balloon stent (Dr. R.Marella Chimes . RENAL ARTERY STENT    . RENAL DOPPLER  09/2012   left renal artery stent - 60-99% diameter reduction  . TRANSTHORACIC ECHOCARDIOGRAM  06/2012   EF 55-60%, mild MR    Family History  Problem Relation Age of Onset  . Hypertension Mother   . Heart disease Mother   . Dementia Mother   . Hyperlipidemia Mother   . Heart disease Father   . Hyperlipidemia Father   . Cancer Father        lung cancer and prostate cancer   . Hypertension Father   . Breast cancer Cousin   . Cancer Cousin        4 paternal and 1 maternal cousins had breast cancer   . Healthy Brother   . Colon cancer Neg Hx   .  Esophageal cancer Neg Hx   . Stomach cancer Neg Hx   . Rectal cancer Neg Hx     Social History   Socioeconomic History  . Marital status: Divorced    Spouse name: Not on file  . Number of children: 1  . Years of education: 24  . Highest education level: Not on file  Occupational History  . Occupation: Education officer, museum  Tobacco Use  . Smoking status: Current Some Day Smoker    Packs/day: 1.00    Years: 30.00    Pack years: 30.00    Types: Cigarettes  . Smokeless tobacco: Never Used  Substance and Sexual Activity  . Alcohol use: Yes    Alcohol/week: 7.0 standard drinks    Types: 7 Glasses of wine per week  . Drug use: No  . Sexual activity: Not Currently  Other Topics Concern  . Not on file  Social History Narrative   Divorced   1 son, local who lives with her    Retired from [employer] work   Hydrologist grad   Has poodle named "Scientist, forensic"   Social Determinants of Radio broadcast assistant Strain:   . Difficulty of Paying Living Expenses: Not on file  Food Insecurity:   . Worried About Charity fundraiser in the Last Year: Not on file  . Ran Out of Food in the Last Year: Not on file  Transportation Needs:   . Lack of Transportation (Medical): Not on file  . Lack of Transportation (Non-Medical): Not on file  Physical Activity:   . Days of Exercise per Week: Not on file  . Minutes of Exercise per Session: Not on file  Stress:   . Feeling of Stress : Not on file  Social Connections:   . Frequency of Communication with Friends and Family: Not on file  . Frequency of Social Gatherings with Friends and Family: Not on file  . Attends Religious Services: Not on file  . Active Member of Clubs or Organizations: Not on file  . Attends Archivist Meetings: Not on file  . Marital Status: Not on file  Intimate Partner Violence:   . Fear of Current or Ex-Partner: Not on file  . Emotionally Abused: Not on file  . Physically Abused: Not on file  . Sexually Abused: Not on file    Outpatient Medications Prior to Visit  Medication Sig Dispense Refill  . ALPRAZolam (XANAX) 0.25 MG tablet Take 0.5 tablets by mouth at bedtime as needed.    . candesartan (ATACAND) 8 MG tablet Take 1 tablet (8 mg total) by mouth daily. 90 tablet 0  . sertraline (ZOLOFT) 100 MG tablet Take 100 mg by mouth daily.     Marland Kitchen atorvastatin (LIPITOR) 20 MG tablet Take 1 tablet (20 mg total) by mouth at bedtime. 90 tablet 0  . propranolol (INDERAL) 20 MG tablet TAKE 1 TABLET BY MOUTH 3  TIMES DAILY 270 tablet 0  . ketoconazole (NIZORAL) 2 % shampoo APPLY TOPICALLY TWICE A WEEK (Patient not taking: Reported on 01/17/2020) 120 mL 2   No facility-administered medications prior to visit.    No Known Allergies  ROS Review of Systems A fourteen system review of systems was performed  and found to be positive as per HPI.   Objective:    Physical Exam General:  Well Developed, Cooperative, in no acute distress Neuro:  Alert and oriented x 1, extra-ocular muscles intact  HEENT:  Normocephalic, atraumatic, neck supple Skin:  no  gross rash, warm, pink. Cardiac:  RRR, S1 S2 Respiratory:  ECTA B/L, Not using accessory muscles, speaking in full sentences- unlabored. MSK: No bony abnormalities, no TTP of upper back/paraspinal muscles/low back, unsteady gait Vascular:  Ext warm, no cyanosis apprec.; no gross edema Psych:  No HI/SI, judgement and insight good, Euthymic mood. Full Affect.  BP (!) 183/82   Pulse 70   Ht 5' 1.5" (1.562 m)   Wt 115 lb (52.2 kg)   SpO2 100%   BMI 21.38 kg/m  Wt Readings from Last 3 Encounters:  01/17/20 115 lb (52.2 kg)  06/23/19 140 lb (63.5 kg)  01/28/19 147 lb 1.6 oz (66.7 kg)     Health Maintenance Due  Topic Date Due  . DEXA SCAN  Never done  . INFLUENZA VACCINE  10/10/2019  . COLONOSCOPY  12/15/2019    There are no preventive care reminders to display for this patient.  Lab Results  Component Value Date   TSH 4.580 (H) 09/22/2018   Lab Results  Component Value Date   WBC 7.0 10/01/2018   HGB 13.7 10/01/2018   HCT 41.4 10/01/2018   MCV 93.2 10/01/2018   PLT 215 10/01/2018   Lab Results  Component Value Date   NA 137 10/01/2018   K 4.1 10/01/2018   CHLORIDE 104 03/14/2017   CO2 28 10/01/2018   GLUCOSE 75 10/01/2018   BUN 12 10/01/2018   CREATININE 1.00 10/01/2018   BILITOT 0.3 01/28/2019   ALKPHOS 70 01/28/2019   AST 27 01/28/2019   ALT 22 01/28/2019   PROT 6.6 01/28/2019   ALBUMIN 4.5 01/28/2019   CALCIUM 9.2 10/01/2018   ANIONGAP 6 10/01/2018   EGFR >60 03/14/2017   GFR 92.33 07/07/2014   Lab Results  Component Value Date   CHOL 193 01/28/2019   Lab Results  Component Value Date   HDL 70 01/28/2019   Lab Results  Component Value Date   LDLCALC 106 (H) 01/28/2019   Lab Results  Component  Value Date   TRIG 95 01/28/2019   Lab Results  Component Value Date   CHOLHDL 2.8 01/28/2019   Lab Results  Component Value Date   HGBA1C 5.5 09/22/2018      Assessment & Plan:   Problem List Items Addressed This Visit      Cardiovascular and Mediastinum   Essential hypertension - Primary   Relevant Medications   atorvastatin (LIPITOR) 20 MG tablet   propranolol (INDERAL) 20 MG tablet     Other   Hyperlipidemia   Relevant Medications   atorvastatin (LIPITOR) 20 MG tablet   propranolol (INDERAL) 20 MG tablet    Other Visit Diagnoses    Frequent falls       Relevant Orders   Ambulatory referral to Neurology   Memory changes       Relevant Orders   Ambulatory referral to Neurology   Weight loss, non-intentional       Relevant Orders   Ambulatory referral to Neurology   Low back pain without sciatica, unspecified back pain laterality, unspecified chronicity       Relevant Orders   DG Lumbar Spine Complete     Frequent falls, Memory changes, Weight loss: -Patient's 6CIT score is 20 and with significant decline in memory and cognition recommend referral to Neurology for further evaluation. Both are agreeable. -Patient has lost 25 pounds since last OV in 06/2019 and most likely due to poor calorie intake. Advised to have 3 small meals with intermittent snacks  and continue with Carnation drinks.  -Patient will likely benefit from PT for strengthening exercises and plan to further discuss at follow up appointment.  Hypertension: -BP elevated today, patient has been out of medication for several days. Provided refill. -Recommend to monitor BP and pulse at home, and if BP is consistently >140/90 then should notify the clinic for medication adjustments. -Recommend to increase water intake. -Will continue to monitor.  Low back pain: -Due to history of frequent falls recommend imaging studies with lumbar x-ray to evaluate for any bony abnormalities. Both are  agreeable. -Recommend conservative therapy and apply heat/ice to area.   Hyperlipidemia:  -Last lipid panel: total cholesterol 193, triglycerides 95, HDL 70, LDL 106 -Continue current medication regimen.  Provided refill. -Follow a heart healthy diet. -Advised to schedule a lab visit for fasting blood work to repeat lipid panel and hepatic function. Patient is due for annual labs.  Meds ordered this encounter  Medications  . atorvastatin (LIPITOR) 20 MG tablet    Sig: Take 1 tablet (20 mg total) by mouth at bedtime.    Dispense:  90 tablet    Refill:  0  . propranolol (INDERAL) 20 MG tablet    Sig: Take 1 tablet (20 mg total) by mouth 3 (three) times daily.    Dispense:  270 tablet    Refill:  0    Requesting 1 year supply    Follow-up: Return in about 2 months (around 03/18/2020) for HTN, Memory with FBW in 1-3 wks.   Note:  This note was prepared with assistance of Dragon voice recognition software. Occasional wrong-word or sound-a-like substitutions may have occurred due to the inherent limitations of voice recognition software.   Lorrene Reid, PA-C

## 2020-01-17 NOTE — Patient Instructions (Signed)

## 2020-01-31 ENCOUNTER — Other Ambulatory Visit: Payer: Self-pay

## 2020-01-31 ENCOUNTER — Other Ambulatory Visit: Payer: Medicare Other

## 2020-01-31 DIAGNOSIS — Z0000 Encounter for general adult medical examination without abnormal findings: Secondary | ICD-10-CM

## 2020-01-31 DIAGNOSIS — R739 Hyperglycemia, unspecified: Secondary | ICD-10-CM

## 2020-01-31 DIAGNOSIS — E785 Hyperlipidemia, unspecified: Secondary | ICD-10-CM

## 2020-01-31 DIAGNOSIS — I10 Essential (primary) hypertension: Secondary | ICD-10-CM

## 2020-02-06 ENCOUNTER — Emergency Department (HOSPITAL_COMMUNITY): Payer: Medicare Other

## 2020-02-06 ENCOUNTER — Encounter (HOSPITAL_COMMUNITY): Payer: Self-pay | Admitting: Internal Medicine

## 2020-02-06 ENCOUNTER — Inpatient Hospital Stay (HOSPITAL_COMMUNITY)
Admission: EM | Admit: 2020-02-06 | Discharge: 2020-03-11 | DRG: 564 | Disposition: E | Payer: Medicare Other | Attending: Internal Medicine | Admitting: Internal Medicine

## 2020-02-06 DIAGNOSIS — I10 Essential (primary) hypertension: Secondary | ICD-10-CM | POA: Diagnosis not present

## 2020-02-06 DIAGNOSIS — W19XXXA Unspecified fall, initial encounter: Secondary | ICD-10-CM | POA: Diagnosis present

## 2020-02-06 DIAGNOSIS — E870 Hyperosmolality and hypernatremia: Secondary | ICD-10-CM | POA: Diagnosis not present

## 2020-02-06 DIAGNOSIS — R230 Cyanosis: Secondary | ICD-10-CM | POA: Diagnosis present

## 2020-02-06 DIAGNOSIS — F17210 Nicotine dependence, cigarettes, uncomplicated: Secondary | ICD-10-CM | POA: Diagnosis present

## 2020-02-06 DIAGNOSIS — J9601 Acute respiratory failure with hypoxia: Secondary | ICD-10-CM | POA: Diagnosis not present

## 2020-02-06 DIAGNOSIS — N179 Acute kidney failure, unspecified: Secondary | ICD-10-CM | POA: Diagnosis present

## 2020-02-06 DIAGNOSIS — Z20822 Contact with and (suspected) exposure to covid-19: Secondary | ICD-10-CM | POA: Diagnosis present

## 2020-02-06 DIAGNOSIS — I5021 Acute systolic (congestive) heart failure: Secondary | ICD-10-CM | POA: Diagnosis present

## 2020-02-06 DIAGNOSIS — T796XXD Traumatic ischemia of muscle, subsequent encounter: Secondary | ICD-10-CM | POA: Diagnosis not present

## 2020-02-06 DIAGNOSIS — Z803 Family history of malignant neoplasm of breast: Secondary | ICD-10-CM | POA: Diagnosis not present

## 2020-02-06 DIAGNOSIS — S0003XA Contusion of scalp, initial encounter: Secondary | ICD-10-CM | POA: Diagnosis present

## 2020-02-06 DIAGNOSIS — Z8249 Family history of ischemic heart disease and other diseases of the circulatory system: Secondary | ICD-10-CM

## 2020-02-06 DIAGNOSIS — R0682 Tachypnea, not elsewhere classified: Secondary | ICD-10-CM

## 2020-02-06 DIAGNOSIS — I110 Hypertensive heart disease with heart failure: Secondary | ICD-10-CM | POA: Diagnosis present

## 2020-02-06 DIAGNOSIS — E873 Alkalosis: Secondary | ICD-10-CM | POA: Diagnosis present

## 2020-02-06 DIAGNOSIS — F419 Anxiety disorder, unspecified: Secondary | ICD-10-CM | POA: Diagnosis present

## 2020-02-06 DIAGNOSIS — M6282 Rhabdomyolysis: Secondary | ICD-10-CM | POA: Diagnosis present

## 2020-02-06 DIAGNOSIS — E785 Hyperlipidemia, unspecified: Secondary | ICD-10-CM | POA: Diagnosis present

## 2020-02-06 DIAGNOSIS — Z83438 Family history of other disorder of lipoprotein metabolism and other lipidemia: Secondary | ICD-10-CM

## 2020-02-06 DIAGNOSIS — T68XXXD Hypothermia, subsequent encounter: Secondary | ICD-10-CM | POA: Diagnosis not present

## 2020-02-06 DIAGNOSIS — Z90710 Acquired absence of both cervix and uterus: Secondary | ICD-10-CM | POA: Diagnosis not present

## 2020-02-06 DIAGNOSIS — E871 Hypo-osmolality and hyponatremia: Secondary | ICD-10-CM | POA: Diagnosis present

## 2020-02-06 DIAGNOSIS — G928 Other toxic encephalopathy: Secondary | ICD-10-CM | POA: Diagnosis present

## 2020-02-06 DIAGNOSIS — Z66 Do not resuscitate: Secondary | ICD-10-CM | POA: Diagnosis not present

## 2020-02-06 DIAGNOSIS — Z79899 Other long term (current) drug therapy: Secondary | ICD-10-CM | POA: Diagnosis not present

## 2020-02-06 DIAGNOSIS — R0902 Hypoxemia: Secondary | ICD-10-CM

## 2020-02-06 DIAGNOSIS — R413 Other amnesia: Secondary | ICD-10-CM | POA: Diagnosis not present

## 2020-02-06 DIAGNOSIS — R296 Repeated falls: Secondary | ICD-10-CM | POA: Diagnosis present

## 2020-02-06 DIAGNOSIS — K219 Gastro-esophageal reflux disease without esophagitis: Secondary | ICD-10-CM | POA: Diagnosis present

## 2020-02-06 DIAGNOSIS — T68XXXA Hypothermia, initial encounter: Secondary | ICD-10-CM | POA: Diagnosis present

## 2020-02-06 DIAGNOSIS — T502X5A Adverse effect of carbonic-anhydrase inhibitors, benzothiadiazides and other diuretics, initial encounter: Secondary | ICD-10-CM | POA: Diagnosis present

## 2020-02-06 DIAGNOSIS — N2889 Other specified disorders of kidney and ureter: Secondary | ICD-10-CM

## 2020-02-06 DIAGNOSIS — R748 Abnormal levels of other serum enzymes: Secondary | ICD-10-CM | POA: Diagnosis present

## 2020-02-06 DIAGNOSIS — G9341 Metabolic encephalopathy: Secondary | ICD-10-CM | POA: Diagnosis not present

## 2020-02-06 DIAGNOSIS — X31XXXA Exposure to excessive natural cold, initial encounter: Secondary | ICD-10-CM | POA: Diagnosis not present

## 2020-02-06 DIAGNOSIS — T796XXA Traumatic ischemia of muscle, initial encounter: Secondary | ICD-10-CM | POA: Diagnosis present

## 2020-02-06 DIAGNOSIS — F0390 Unspecified dementia without behavioral disturbance: Secondary | ICD-10-CM | POA: Diagnosis present

## 2020-02-06 DIAGNOSIS — Z801 Family history of malignant neoplasm of trachea, bronchus and lung: Secondary | ICD-10-CM | POA: Diagnosis not present

## 2020-02-06 DIAGNOSIS — J810 Acute pulmonary edema: Secondary | ICD-10-CM | POA: Diagnosis not present

## 2020-02-06 DIAGNOSIS — M1990 Unspecified osteoarthritis, unspecified site: Secondary | ICD-10-CM | POA: Diagnosis present

## 2020-02-06 DIAGNOSIS — N1330 Unspecified hydronephrosis: Secondary | ICD-10-CM | POA: Diagnosis present

## 2020-02-06 DIAGNOSIS — R000 Tachycardia, unspecified: Secondary | ICD-10-CM | POA: Diagnosis not present

## 2020-02-06 DIAGNOSIS — R682 Dry mouth, unspecified: Secondary | ICD-10-CM | POA: Diagnosis present

## 2020-02-06 DIAGNOSIS — E876 Hypokalemia: Secondary | ICD-10-CM | POA: Diagnosis present

## 2020-02-06 DIAGNOSIS — I959 Hypotension, unspecified: Secondary | ICD-10-CM | POA: Diagnosis present

## 2020-02-06 HISTORY — DX: Other amnesia: R41.3

## 2020-02-06 LAB — CBC WITH DIFFERENTIAL/PLATELET
Abs Immature Granulocytes: 0.37 10*3/uL — ABNORMAL HIGH (ref 0.00–0.07)
Basophils Absolute: 0 10*3/uL (ref 0.0–0.1)
Basophils Relative: 0 %
Eosinophils Absolute: 0 10*3/uL (ref 0.0–0.5)
Eosinophils Relative: 0 %
HCT: 36.4 % (ref 36.0–46.0)
Hemoglobin: 11.9 g/dL — ABNORMAL LOW (ref 12.0–15.0)
Immature Granulocytes: 3 %
Lymphocytes Relative: 5 %
Lymphs Abs: 0.6 10*3/uL — ABNORMAL LOW (ref 0.7–4.0)
MCH: 27.7 pg (ref 26.0–34.0)
MCHC: 32.7 g/dL (ref 30.0–36.0)
MCV: 84.7 fL (ref 80.0–100.0)
Monocytes Absolute: 1.3 10*3/uL — ABNORMAL HIGH (ref 0.1–1.0)
Monocytes Relative: 10 %
Neutro Abs: 10.8 10*3/uL — ABNORMAL HIGH (ref 1.7–7.7)
Neutrophils Relative %: 82 %
Platelets: 210 10*3/uL (ref 150–400)
RBC: 4.3 MIL/uL (ref 3.87–5.11)
RDW: 13.9 % (ref 11.5–15.5)
WBC: 13 10*3/uL — ABNORMAL HIGH (ref 4.0–10.5)
nRBC: 0 % (ref 0.0–0.2)

## 2020-02-06 LAB — CK: Total CK: 1737 U/L — ABNORMAL HIGH (ref 38–234)

## 2020-02-06 LAB — COMPREHENSIVE METABOLIC PANEL
ALT: 81 U/L — ABNORMAL HIGH (ref 0–44)
AST: 106 U/L — ABNORMAL HIGH (ref 15–41)
Albumin: 3.3 g/dL — ABNORMAL LOW (ref 3.5–5.0)
Alkaline Phosphatase: 59 U/L (ref 38–126)
Anion gap: 14 (ref 5–15)
BUN: 24 mg/dL — ABNORMAL HIGH (ref 8–23)
CO2: 24 mmol/L (ref 22–32)
Calcium: 9 mg/dL (ref 8.9–10.3)
Chloride: 97 mmol/L — ABNORMAL LOW (ref 98–111)
Creatinine, Ser: 0.81 mg/dL (ref 0.44–1.00)
GFR, Estimated: >60 mL/min (ref >60)
Glucose, Bld: 93 mg/dL (ref 70–99)
Potassium: 3.9 mmol/L (ref 3.5–5.1)
Sodium: 135 mmol/L (ref 135–145)
Total Bilirubin: 1 mg/dL (ref 0.3–1.2)
Total Protein: 6.2 g/dL — ABNORMAL LOW (ref 6.5–8.1)

## 2020-02-06 LAB — RESP PANEL BY RT-PCR (FLU A&B, COVID) ARPGX2
Influenza A by PCR: NEGATIVE
Influenza B by PCR: NEGATIVE
SARS Coronavirus 2 by RT PCR: NEGATIVE

## 2020-02-06 LAB — LACTIC ACID, PLASMA: Lactic Acid, Venous: 2.2 mmol/L (ref 0.5–1.9)

## 2020-02-06 MED ORDER — ACETAMINOPHEN 650 MG RE SUPP: 650.0000 mg | Freq: Four times a day (QID) | RECTAL | Status: DC | PRN

## 2020-02-06 MED ORDER — HALOPERIDOL LACTATE 5 MG/ML IJ SOLN
2.0000 mg | Freq: Four times a day (QID) | INTRAMUSCULAR | Status: DC | PRN
  Administered 2020-02-06 – 2020-02-10 (×5): 2 mg via INTRAVENOUS
  Filled 2020-02-06 (×5): qty 1

## 2020-02-06 MED ORDER — SODIUM CHLORIDE 0.45 % IV SOLN: INTRAVENOUS | Status: DC

## 2020-02-06 MED ORDER — KETOROLAC TROMETHAMINE 15 MG/ML IJ SOLN: 15.0000 mg | Freq: Four times a day (QID) | INTRAMUSCULAR | Status: DC | PRN

## 2020-02-06 MED ORDER — SENNA 8.6 MG PO TABS
1.0000 | ORAL_TABLET | Freq: Two times a day (BID) | ORAL | Status: DC
  Administered 2020-02-06 – 2020-02-12 (×10): 8.6 mg via ORAL
  Filled 2020-02-06 (×11): qty 1

## 2020-02-06 MED ORDER — ATORVASTATIN CALCIUM 10 MG PO TABS
20.0000 mg | ORAL_TABLET | Freq: Every day | ORAL | Status: DC
  Administered 2020-02-06: 20 mg via ORAL
  Filled 2020-02-06: qty 2

## 2020-02-06 MED ORDER — TRAZODONE HCL 50 MG PO TABS
25.0000 mg | ORAL_TABLET | Freq: Every evening | ORAL | Status: DC | PRN
  Administered 2020-02-08 – 2020-02-12 (×3): 25 mg via ORAL
  Filled 2020-02-06 (×4): qty 1

## 2020-02-06 MED ORDER — ENOXAPARIN SODIUM 40 MG/0.4ML ~~LOC~~ SOLN
40.0000 mg | SUBCUTANEOUS | Status: DC
  Administered 2020-02-06 – 2020-02-12 (×7): 40 mg via SUBCUTANEOUS
  Filled 2020-02-06 (×7): qty 0.4

## 2020-02-06 MED ORDER — PROPRANOLOL HCL 40 MG PO TABS
20.0000 mg | ORAL_TABLET | Freq: Three times a day (TID) | ORAL | Status: DC
  Administered 2020-02-06 – 2020-02-12 (×16): 20 mg via ORAL
  Filled 2020-02-06 (×20): qty 1

## 2020-02-06 MED ORDER — SODIUM CHLORIDE 0.9 % IV BOLUS
1000.0000 mL | Freq: Once | INTRAVENOUS | Status: AC
  Administered 2020-02-06: 1000 mL via INTRAVENOUS

## 2020-02-06 MED ORDER — IRBESARTAN 150 MG PO TABS
75.0000 mg | ORAL_TABLET | Freq: Every day | ORAL | Status: DC
  Administered 2020-02-07: 75 mg via ORAL
  Filled 2020-02-06: qty 1

## 2020-02-06 MED ORDER — ACETAMINOPHEN 325 MG PO TABS
650.0000 mg | ORAL_TABLET | Freq: Four times a day (QID) | ORAL | Status: DC | PRN
  Administered 2020-02-11: 650 mg via ORAL
  Filled 2020-02-06: qty 2

## 2020-02-06 MED ORDER — SERTRALINE HCL 100 MG PO TABS: 100.0000 mg | ORAL_TABLET | Freq: Every day | ORAL | Status: DC

## 2020-02-06 NOTE — ED Provider Notes (Signed)
[place] of the patient assumed at the change of shift, found outside after a fall by family. Noted to be very hypothermic. Improving with warming measures.   5:31 PM CBC with mild leukocytosis, CMP no concerning findings, CK and lactic acid mildly elevated. Plan admission for further warming. Family at bedside amenable.   5:55 PM  Spoke with Dr. Linda Hedges, Hospitalist, who will evaluate for admission.    Truddie Hidden, MD 01/29/2020 1755

## 2020-02-06 NOTE — ED Notes (Signed)
Pt resting in stretcher, attempting to climb out of bed, pt not able toi be redirected, son at bedside holding pt top bed, pt provided haldol. VSS on monitor, oral temp 98.58f , bare hugger paused at this time because pt reporting feeling too hot. Family updated on wait times. Side rails up, call bell in reach.

## 2020-02-06 NOTE — ED Notes (Addendum)
Pt incotinent of bowel. Pt cleaned and new linens provided, pure wick in place. Pt able to turn and follow instructions well, pts vss, Ccm in place. Son at bedside

## 2020-02-06 NOTE — ED Notes (Signed)
Marie "GiGi" - RN present and aware of pt's rectal temp (91.5).

## 2020-02-06 NOTE — ED Notes (Signed)
Provider at beside, Rectal temp was 87.4. Beat hugger ordered and placed

## 2020-02-06 NOTE — ED Provider Notes (Signed)
[facility] EMERGENCY DEPARTMENT Provider Note   CSN: [id number] Arrival date & time: 01/18/2020  1416     History Chief Complaint  Patient presents with  . Cold Exposure    Glenda Stevens is a 74 y.o. female.  Pt presents to the ED today with cold exposure and fall.  Pt lives at home.  Her son is there sometimes.  Neighbors were coming back from church and saw pt's foot sticking out from behind some stairs outside.  They checked and found her lying at the bottom of the stairs.  Pt has no idea how long she was there.  EMS said they put warmed blankets around her and increased the heat in the back of the truck.  Her toes were initially black, but now have more color.  Pt denies any pain.  Per epic review, pt has been falling frequently.        Past Medical History:  Diagnosis Date  . Allergy    Hay fever  . Anxiety   . Arthritis   . Cataract   . 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 GERD (gastroesophageal reflux disease)   . History of chicken pox   . Hyperlipidemia   . Hypertension   . Phlebitis and thrombophlebitis   . PONV (postoperative nausea and vomiting)   . Renal artery stenosis ([facility])    left stent in 2003  . Renovascular hypertension   . S/P radiation therapy 09/05/14-10/19/14   anal ca,50.4Gy    Patient Active Problem List   Diagnosis Date Noted  . Acute lumbar back pain 11/03/2018  . Blood glucose elevated 07/16/2018  . Syncope and collapse 06/03/2018  . Abnormal urinalysis 06/03/2018  . Dizziness 06/03/2018  . Poor memory 06/03/2018  . Family history of dementia 06/03/2018  . Healthcare maintenance 05/01/2017  . Corneal abrasion, right 10/17/2016  . Vitamin D deficiency 07/18/2016  . Other fatigue 05/09/2016  . Bilateral hip pain 05/09/2016  . Tinea corporis 05/09/2016  . Foot pain, bilateral 05/09/2016  . Medicare annual wellness visit, initial 07/06/2015  . Anxiety 07/06/2015  . Rectal cancer ([facility]) 08/01/2014  .  Colon cancer ([facility]) 08/01/2014  . Diarrhea 07/08/2014  . Seborrheic keratoses 03/18/2014  . Nasal congestion 09/14/2013  . Smoker 09/14/2013  . Renal artery stenosis ([place] [place] [facility]) 08/06/2013  . Hyperlipidemia 08/06/2013  . Essential hypertension 08/06/2013    Past Surgical History:  Procedure Laterality Date  . ABDOMINAL HYSTERECTOMY    . BREAST SURGERY     bilateral lumpectomy-benign  . CATARACT EXTRACTION Bilateral   . COLON RESECTION N/A 08/18/2014   Procedure: LAPAROSCOPIC ASSISTED  COLON POLYP RESECTION , DIAGNOSTIC LAPROSCOPY ;  Surgeon: Leighton Ruff, MD;  Location: [facility];  Service: General;  Laterality: N/A;  . NASAL SINUS SURGERY     patient denies having had nasal sinus surgery  . NM MYOCAR PERF WALL MOTION  09/2009   bruce myoview - normal pattern of perfusion, EF 83%, low risk scan  . RENAL ARTERY ANGIOPLASTY  07/02/2001   6x79mm Genesis on Aviator balloon stent overlapping a 6x70mm Genesist on Aviator balloon stent (Dr. Marella Chimes)  . RENAL ARTERY STENT    . RENAL DOPPLER  09/2012   left renal artery stent - 60-99% diameter reduction  . TRANSTHORACIC ECHOCARDIOGRAM  06/2012   EF 55-60%, mild MR     OB History   No obstetric history on file.     Family History  Problem Relation Age  of Onset  . Hypertension Mother   . Heart disease Mother   . Dementia Mother   . Hyperlipidemia Mother   . Heart disease Father   . Hyperlipidemia Father   . Cancer Father        lung cancer and prostate cancer   . Hypertension Father   . Breast cancer Cousin   . Cancer Cousin        4 paternal and 1 maternal cousins had breast cancer   . Healthy Brother   . Colon cancer Neg Hx   . Esophageal cancer Neg Hx   . Stomach cancer Neg Hx   . Rectal cancer Neg Hx     Social History   Tobacco Use  . Smoking status: Current Some Day Smoker    Packs/day: 1.00    Years: 30.00    Pack years: 30.00    Types: Cigarettes  . Smokeless tobacco: Never Used  Substance Use Topics  . Alcohol  use: Yes    Alcohol/week: 7.0 standard drinks    Types: 7 Glasses of wine per week  . Drug use: No    Home Medications Prior to Admission medications   Medication Sig Start Date End Date Taking? Authorizing Provider  ALPRAZolam Duanne Moron) 0.25 MG tablet Take 0.5 tablets by mouth at bedtime as needed. 04/08/19   [provider]  atorvastatin (LIPITOR) 20 MG tablet Take 1 tablet (20 mg total) by mouth at bedtime. 01/17/20   Abonza, Herb Grays, PA-C  candesartan (ATACAND) 8 MG tablet Take 1 tablet (8 mg total) by mouth daily. 06/23/19   Opalski, Neoma Laming, DO  propranolol (INDERAL) 20 MG tablet Take 1 tablet (20 mg total) by mouth 3 (three) times daily. 01/17/20   Lorrene Reid, PA-C  sertraline (ZOLOFT) 100 MG tablet Take 100 mg by mouth daily.     [provider]    Allergies    Patient has no known allergies.  Review of Systems   Review of Systems  All other systems reviewed and are negative.   Physical Exam Updated Vital Signs BP (!) 194/107   Resp 20   SpO2 100%   Physical Exam Vitals and nursing note reviewed.  Constitutional:      Appearance: She is underweight.     Comments: Pt is shivering  HENT:     Head: Normocephalic.     Comments: Contusion to scalp above left forehead    Right Ear: External ear normal.     Left Ear: External ear normal.     Nose: Nose normal.     Mouth/Throat:     Mouth: Mucous membranes are dry.  Eyes:     Extraocular Movements: Extraocular movements intact.     Conjunctiva/sclera: Conjunctivae normal.     Pupils: Pupils are equal, round, and reactive to light.  Cardiovascular:     Rate and Rhythm: Normal rate and regular rhythm.  Pulmonary:     Effort: Pulmonary effort is normal.     Breath sounds: Normal breath sounds.  Abdominal:     General: Abdomen is flat. Bowel sounds are normal.     Palpations: Abdomen is soft.  Musculoskeletal:        General: Normal range of motion.     Cervical back: Normal range of motion and  neck supple.     Comments: See pictures  Skin dusky to soles of feet.  Improving as they are getting warmed.  Skin:    Capillary Refill: Capillary refill takes less than 2  seconds.     Comments: Cool to touch  Neurological:     Mental Status: She is alert.     Comments: Oriented to person and place.  ? time  Psychiatric:        Mood and Affect: Mood normal.           ED Results / Procedures / Treatments   Labs (all labs ordered are listed, but only abnormal results are displayed) Labs Reviewed  RESP PANEL BY RT-PCR (FLU A&B, COVID) [id number]  CBC WITH DIFFERENTIAL/PLATELET  COMPREHENSIVE METABOLIC PANEL  URINALYSIS, ROUTINE W REFLEX MICROSCOPIC  LACTIC ACID, PLASMA  CK  CBG MONITORING, ED    EKG EKG Interpretation  Date/Time:  Sunday February 06 2020 14:25:32 EST Ventricular Rate:  81 PR Interval:    QRS Duration: 103 QT Interval:  499 QTC Calculation: 580 R Axis:   65 Text Interpretation: Sinus rhythm Short PR interval Right atrial enlargement Prolonged QT interval Artifact in lead(s) I II III aVR aVL aVF V1 V5 V6 pt is shivering, so baseline is not great Confirmed by Isla Pence [phone]) on 01/12/2020 3:04:08 PM   Radiology CT Head Wo Contrast  Result Date: 01/23/2020 CLINICAL DATA:  Altered mental status.  Recent fall EXAM: CT HEAD WITHOUT CONTRAST TECHNIQUE: Contiguous axial images were obtained from the base of the skull through the vertex without intravenous contrast. COMPARISON:  December 12, 2017 head CT.  Brain MRI December 25, 2005 FINDINGS: Brain: Stable mild to moderate diffuse atrophy. There is no intracranial mass, hemorrhage, extra-axial fluid collection, or midline shift. There is small vessel disease throughout the centra semiovale bilaterally. Small vessel disease is noted in the anterior limbs of each external capsule and internal capsule. No acute infarct is appreciable. Vascular: No hyperdense vessel. There is calcification in each carotid siphon  region. Skull: Bony calvarium appears intact. Sinuses/Orbits: Previous erosion and remodeling of the orbital floor regions with well-defined infraorbital masses, likely schwannoma is given previous appearance. Lesions are stable compared to 2007. Paranasal sinuses clear. Other: There is opacification in multiple mastoid air cells on the left. There are several mastoid air cells inferiorly opacified on the right. IMPRESSION: 1. 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 No acute infarct. No intra-axial mass or hemorrhage. 2. 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 3.  Foci of arterial vascular calcification noted. 4. Opacification of multiple mastoid air cells, more severe on the left than the right. Electronically Signed   By: Lowella Grip III M.D.   On: 01/28/2020 15:37   DG Chest Portable 1 View  Result Date: 01/23/2020 CLINICAL DATA:  Altered mental status. EXAM: PORTABLE CHEST 1 VIEW COMPARISON:  Chest CT Aug 04, 2014 FINDINGS: Cardiomediastinal silhouette is normal. Mediastinal contours appear intact. Calcific atherosclerotic disease and tortuosity of the aorta. There is no evidence of focal airspace consolidation, pleural effusion or pneumothorax. Osseous structures are without acute abnormality. Soft tissues are grossly normal. IMPRESSION: 1. No active disease. 2. Calcific atherosclerotic disease and tortuosity of the aorta. Electronically Signed   By: Fidela Salisbury M.D.   On: 01/20/2020 14:43    Procedures Procedures (including critical care time)  Medications Ordered in ED Medications  sodium chloride 0.9 % bolus 1,000 mL (1,000 mLs Intravenous New Bag/Given 01/11/2020 1430)    ED Course  I have reviewed the triage vital signs and the nursing notes.  Pertinent labs & imaging results that were available during my care  of the patient were reviewed by me and considered in my medical  decision making (see chart for details).    MDM Rules/Calculators/A&P                          Due to hypothermia, pt placed on a bair hugger and warmed fluids were given.  CT head shows no trauma.  CXR is clear.  Labs and urine are pending at shift change.    Pt signed out to Dr. Karle Starch at shift change.  I anticipate pt will need admission.  Final Clinical Impression(s) / ED Diagnoses Final diagnoses:  Hypothermia, initial encounter  Fall, initial encounter    Rx / DC Orders ED Discharge Orders    None       Isla Pence, MD 01/25/2020 1550

## 2020-02-06 NOTE — H&P (Signed)
History and Physical    Glenda Stevens WLN:[id number] DOB: Apr 13, 1945 DOA: 02/02/2020  PCP: Lorrene Reid, PA-C (Confirm with patient/family/NH records and if not entered, this has to be entered at [facility] point of entry) Patient coming from: home  I have personally briefly reviewed patient's old medical records in [place]  Chief Complaint: found down  HPI: Glenda Stevens is a 74 y.o. female with medical history significant of HTN, progressive memory loss with self neglect -fails to take medications, eat, frequent falls. Wa found by neighbors earlier on the day of admission down behind the stairs where she lives, evidently since the preceeding evening. It appears she fell. Was evidently concious at the time she was found. EMS wall called. The patient was initially very cold with Temp in the 80's. She was starting on warming blankets and transported to [facility]   ED Course: T 91.5 rising to 95.6 after several hours in warming blanket. BP 99/57, HR 92, RR 21. ED-MD exam notable for rigors, contusion to scalp above left forehead, dry mucus membranes, acral cyanosis.  Cmet revealed low Albumin at 3.3, ST 106, ALT 81, WBC 13.9 w/ 82/5/10. CK total 1,737. Lactic acid 2.2. CT head w/o acute injury. EKG with SR. TRH called to admit patient for treatment of rhabdomyolysis and continued warming.   Review of Systems: As per HPI otherwise 10 point review of systems negative.    Past Medical History:  Diagnosis Date  . Allergy    Hay fever  . Anxiety   . Arthritis   . Cataract   . 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 GERD (gastroesophageal reflux disease)   . History of chicken pox   . Hyperlipidemia   . Hypertension   . Memory loss    forgetfull, sundowning  . Phlebitis and thrombophlebitis   . PONV (postoperative nausea and vomiting)   . Renal artery stenosis ([facility])    left stent in 2003  . Renovascular hypertension   . 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 ABDOMINAL HYSTERECTOMY    . BREAST SURGERY     bilateral lumpectomy-benign  . CATARACT EXTRACTION Bilateral   . COLON RESECTION N/A 08/18/2014   Procedure: LAPAROSCOPIC ASSISTED  COLON POLYP RESECTION , DIAGNOSTIC LAPROSCOPY ;  Surgeon: Leighton Ruff, MD;  Location: [facility];  Service: General;  Laterality: N/A;  . NASAL SINUS SURGERY     patient denies having had nasal sinus surgery  . NM MYOCAR PERF WALL MOTION  09/2009   bruce myoview - normal pattern of perfusion, EF 83%, low risk scan  . RENAL ARTERY ANGIOPLASTY  07/02/2001   6x58mm Genesis on Aviator balloon stent overlapping a 6x47mm Genesist on Aviator balloon stent (Dr. Marella Chimes)  . RENAL ARTERY STENT    . RENAL DOPPLER  09/2012   left renal artery stent - 60-99% diameter reduction  . TRANSTHORACIC ECHOCARDIOGRAM  06/2012   EF 55-60%, mild MR    Soc Hx- married - ended in Divorce. One son with whom she lives. Worked for [employer] for 25+ years.    reports that she has been smoking cigarettes. She has a 30.00 pack-year smoking history. She has never used smokeless tobacco. She reports current alcohol use of about 7.0 standard drinks of alcohol per week. She reports that she does not use drugs.  No Known Allergies  Family History  Problem Relation Age  of Onset  . Hypertension Mother   . Heart disease Mother   . Dementia Mother   . Hyperlipidemia Mother   . Heart disease Father   . Hyperlipidemia Father   . Cancer Father        lung cancer and prostate cancer   . Hypertension Father   . Breast cancer Cousin   . Cancer Cousin        4 paternal and 1 maternal cousins had breast cancer   . Healthy Brother   . Colon cancer Neg Hx   . Esophageal cancer Neg Hx   . Stomach cancer Neg Hx   . Rectal cancer Neg Hx      Prior to Admission medications   Medication Sig Start Date End Date Taking? Authorizing Provider  ALPRAZolam Duanne Moron) 0.25 MG tablet Take  0.5 tablets by mouth at bedtime as needed. 04/08/19   [provider]  atorvastatin (LIPITOR) 20 MG tablet Take 1 tablet (20 mg total) by mouth at bedtime. 01/17/20   Abonza, Herb Grays, PA-C  candesartan (ATACAND) 8 MG tablet Take 1 tablet (8 mg total) by mouth daily. 06/23/19   Opalski, Neoma Laming, DO  propranolol (INDERAL) 20 MG tablet Take 1 tablet (20 mg total) by mouth 3 (three) times daily. 01/17/20   Lorrene Reid, PA-C  sertraline (ZOLOFT) 100 MG tablet Take 100 mg by mouth daily.     [provider]    Physical Exam: Vitals:   02/08/2020 1615 01/16/2020 1653 01/14/2020 1730 01/11/2020 1752  BP:  (!) 149/101 (!) 151/80 (!) 99/57  Pulse: 81 83 83 92  Resp: (!) 22 20 (!) 25 (!) 21  Temp:    (!) 95.6 F (35.3 C)  TempSrc:    Rectal  SpO2: 100% 100% 100% 100%     Vitals:   01/12/2020 1615 01/16/2020 1653 01/13/2020 1730 02/01/2020 1752  BP:  (!) 149/101 (!) 151/80 (!) 99/57  Pulse: 81 83 83 92  Resp: (!) 22 20 (!) 25 (!) 21  Temp:    (!) 95.6 F (35.3 C)  TempSrc:    Rectal  SpO2: 100% 100% 100% 100%   General: Thin woman who appears chronically ill Eyes: PERRL, lids and conjunctivae normal ENMT: Mucous membranes are dry. Posterior pharynx clear of any exudate or lesions.Normal dentition.  Neck: normal, supple, no masses, no thyromegaly Respiratory: clear to auscultation bilaterally, no wheezing, no crackles. Normal respiratory effort. No accessory muscle use.  Cardiovascular: Regular rate and rhythm, no murmurs / rubs / gallops. No extremity edema. 1+ pedal pulses. No carotid bruits.  Abdomen: no tenderness, no masses palpated. No hepatosplenomegaly. Bowel sounds positive.  Musculoskeletal: no clubbing / cyanosis. Valgus deformity great toes. Good ROM, no contractures. Decreased muscle tone.  Skin: abrasion/contusion right shoulder, bilateral distal LE, forehead. Neurologic: CN 2-12 grossly intact.  Strength 4/5 in all 4.  Psychiatric: Oriented to person, son. She knows she  isn't at home. No insight to medical condition.    Labs on Admission: I have personally reviewed following labs and imaging studies  CBC: Recent Labs  Lab 01/27/2020 1627  WBC 13.0*  NEUTROABS 10.8*  HGB 11.9*  HCT 36.4  MCV 84.7  PLT 938   Basic Metabolic Panel: Recent Labs  Lab 02/08/2020 1627  NA 135  K 3.9  CL 97*  CO2 24  GLUCOSE 93  BUN 24*  CREATININE 0.81  CALCIUM 9.0   GFR: CrCl cannot be calculated (Unknown ideal weight.). Liver Function Tests: Recent Labs  Lab  01/26/2020 1627  AST 106*  ALT 81*  ALKPHOS 59  BILITOT 1.0  PROT 6.2*  ALBUMIN 3.3*   No results for input(s): LIPASE, AMYLASE in the last 168 hours. No results for input(s): AMMONIA in the last 168 hours. Coagulation Profile: No results for input(s): INR, PROTIME in the last 168 hours. Cardiac Enzymes: Recent Labs  Lab 01/18/2020 1627  CKTOTAL 1,737*   BNP (last 3 results) No results for input(s): PROBNP in the last 8760 hours. HbA1C: No results for input(s): HGBA1C in the last 72 hours. CBG: No results for input(s): GLUCAP in the last 168 hours. Lipid Profile: No results for input(s): CHOL, HDL, LDLCALC, TRIG, CHOLHDL, LDLDIRECT in the last 72 hours. Thyroid Function Tests: No results for input(s): TSH, T4TOTAL, FREET4, T3FREE, THYROIDAB in the last 72 hours. Anemia Panel: No results for input(s): VITAMINB12, FOLATE, FERRITIN, TIBC, IRON, RETICCTPCT in the last 72 hours. Urine analysis:    Component Value Date/Time   COLORURINE YELLOW 12/12/2017 0206   APPEARANCEUR HAZY (A) 12/12/2017 0206   LABSPEC 1.010 12/12/2017 0206   PHURINE 6.0 12/12/2017 0206   GLUCOSEU NEGATIVE 12/12/2017 0206   HGBUR SMALL (A) 12/12/2017 0206   BILIRUBINUR negative 06/03/2018 1553   KETONESUR NEGATIVE 12/12/2017 0206   PROTEINUR Negative 06/03/2018 1553   PROTEINUR NEGATIVE 12/12/2017 0206   UROBILINOGEN 0.2 06/03/2018 1553   NITRITE negative 06/03/2018 1553   NITRITE POSITIVE (A) 12/12/2017 0206    LEUKOCYTESUR Small (1+) (A) 06/03/2018 1553    Radiological Exams on Admission: CT Head Wo Contrast  Result Date: 01/12/2020 CLINICAL DATA:  Altered mental status.  Recent fall EXAM: CT HEAD WITHOUT CONTRAST TECHNIQUE: Contiguous axial images were obtained from the base of the skull through the vertex without intravenous contrast. COMPARISON:  December 12, 2017 head CT.  Brain MRI December 25, 2005 FINDINGS: Brain: Stable mild to moderate diffuse atrophy. There is no intracranial mass, hemorrhage, extra-axial fluid collection, or midline shift. There is small vessel disease throughout the centra semiovale bilaterally. Small vessel disease is noted in the anterior limbs of each external capsule and internal capsule. No acute infarct is appreciable. Vascular: No hyperdense vessel. There is calcification in each carotid siphon region. Skull: Bony calvarium appears intact. Sinuses/Orbits: Previous erosion and remodeling of the orbital floor regions with well-defined infraorbital masses, likely schwannoma is given previous appearance. Lesions are stable compared to 2007. Paranasal sinuses clear. Other: There is opacification in multiple mastoid air cells on the left. There are several mastoid air cells inferiorly opacified on the right. IMPRESSION: 1. 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 No acute infarct. No intra-axial mass or hemorrhage. 2. 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 3.  Foci of arterial vascular calcification noted. 4. Opacification of multiple mastoid air cells, more severe on the left than the right. Electronically Signed   By: Lowella Grip III M.D.   On: 01/14/2020 15:37   DG Pelvis Portable  Result Date: 01/27/2020 CLINICAL DATA:  Unwitnessed fall EXAM: PORTABLE PELVIS 1-2 VIEWS COMPARISON:  08/04/2014 FINDINGS: There is no evidence of pelvic fracture or diastasis. No pelvic bone  lesions are seen. IMPRESSION: Negative. Electronically Signed   By: Davina Poke D.O.   On: 01/14/2020 16:11   DG Chest Portable 1 View  Result Date: 02/08/2020 CLINICAL DATA:  Altered mental status. EXAM: PORTABLE CHEST 1 VIEW COMPARISON:  Chest CT Aug 04, 2014 FINDINGS: Cardiomediastinal silhouette is normal. Mediastinal contours appear  intact. Calcific atherosclerotic disease and tortuosity of the aorta. There is no evidence of focal airspace consolidation, pleural effusion or pneumothorax. Osseous structures are without acute abnormality. Soft tissues are grossly normal. IMPRESSION: 1. No active disease. 2. Calcific atherosclerotic disease and tortuosity of the aorta. Electronically Signed   By: Fidela Salisbury M.D.   On: 02/01/2020 14:43    EKG: Independently reviewed. Sinu Rhythm, lots of artifact. No acute change, no STEMI  Assessment/Plan Active Problems:   Rhabdomyolysis   Hypothermia   Essential hypertension   Anxiety   Poor memory    1. Rhabdomyolysis - patient was down overnight. CK 1,737, renal function normal. Plan IV hydration at 75 cc/hr 1/2 NS  CK in AM  2. HTN- continue home medications  3. Hypothermia - currently in [facility] - warming up.  Plan D/c bear hugger when T > 97  4. Memory loss - self-neglect, disorientation and confusion, inabilty to care for herself. Her 9 y/o mother is in a memory care unit. Plan PT/OT eval  Neurology consult  Haldol q 6 prn agitation for patient safety. Discussed risk and benefit with son, who agrees to this treatment  TOC for potential placement.   DVT prophylaxis: Lovenox  Code Status: full code  Family Communication: son present for exam and agrees with Dx and Tx plan. Appreciative of neuro consult and TOC assistance.    Disposition Plan: TBD, expect 48-72 hour admission Consults called: please call neuro for dementia consult  Admission status: inpatient   Adella Hare MD [facility] Pager [phone]  If 7PM-7AM, please contact night-coverage [email or web address] Password [facility]  01/14/2020, 6:32 PM

## 2020-02-06 NOTE — ED Triage Notes (Signed)
BIB EMS for fall and AMS. Pt found outside at the bottom of the steps; unknown downtime. Per EMS BLE were black with cap refill of 8secs. Pt is now alert to person, place. Pt also reported she fell but cant tell when.

## 2020-02-07 ENCOUNTER — Other Ambulatory Visit: Payer: Self-pay

## 2020-02-07 DIAGNOSIS — F419 Anxiety disorder, unspecified: Secondary | ICD-10-CM

## 2020-02-07 DIAGNOSIS — T796XXA Traumatic ischemia of muscle, initial encounter: Secondary | ICD-10-CM | POA: Diagnosis not present

## 2020-02-07 DIAGNOSIS — T68XXXA Hypothermia, initial encounter: Secondary | ICD-10-CM | POA: Diagnosis not present

## 2020-02-07 DIAGNOSIS — I10 Essential (primary) hypertension: Secondary | ICD-10-CM | POA: Diagnosis not present

## 2020-02-07 LAB — RAPID URINE DRUG SCREEN, HOSP PERFORMED
Amphetamines: NOT DETECTED
Barbiturates: NOT DETECTED
Benzodiazepines: POSITIVE — AB
Cocaine: NOT DETECTED
Opiates: NOT DETECTED
Tetrahydrocannabinol: NOT DETECTED

## 2020-02-07 LAB — CBC WITH DIFFERENTIAL/PLATELET
Abs Immature Granulocytes: 0.36 10*3/uL — ABNORMAL HIGH (ref 0.00–0.07)
Basophils Absolute: 0 10*3/uL (ref 0.0–0.1)
Basophils Relative: 0 %
Eosinophils Absolute: 0 10*3/uL (ref 0.0–0.5)
Eosinophils Relative: 0 %
HCT: 33.9 % — ABNORMAL LOW (ref 36.0–46.0)
Hemoglobin: 11.6 g/dL — ABNORMAL LOW (ref 12.0–15.0)
Immature Granulocytes: 2 %
Lymphocytes Relative: 6 %
Lymphs Abs: 0.8 10*3/uL (ref 0.7–4.0)
MCH: 28.1 pg (ref 26.0–34.0)
MCHC: 34.2 g/dL (ref 30.0–36.0)
MCV: 82.1 fL (ref 80.0–100.0)
Monocytes Absolute: 2.1 10*3/uL — ABNORMAL HIGH (ref 0.1–1.0)
Monocytes Relative: 14 %
Neutro Abs: 11.5 10*3/uL — ABNORMAL HIGH (ref 1.7–7.7)
Neutrophils Relative %: 78 %
Platelets: 189 10*3/uL (ref 150–400)
RBC: 4.13 MIL/uL (ref 3.87–5.11)
RDW: 14.4 % (ref 11.5–15.5)
WBC: 14.8 10*3/uL — ABNORMAL HIGH (ref 4.0–10.5)
nRBC: 0 % (ref 0.0–0.2)

## 2020-02-07 LAB — HEPATIC FUNCTION PANEL
ALT: 108 U/L — ABNORMAL HIGH (ref 0–44)
AST: 191 U/L — ABNORMAL HIGH (ref 15–41)
Albumin: 3.1 g/dL — ABNORMAL LOW (ref 3.5–5.0)
Alkaline Phosphatase: 59 U/L (ref 38–126)
Bilirubin, Direct: 0.1 mg/dL (ref 0.0–0.2)
Indirect Bilirubin: 1.2 mg/dL — ABNORMAL HIGH (ref 0.3–0.9)
Total Bilirubin: 1.3 mg/dL — ABNORMAL HIGH (ref 0.3–1.2)
Total Protein: 5.9 g/dL — ABNORMAL LOW (ref 6.5–8.1)

## 2020-02-07 LAB — BASIC METABOLIC PANEL
Anion gap: 17 — ABNORMAL HIGH (ref 5–15)
BUN: 17 mg/dL (ref 8–23)
CO2: 18 mmol/L — ABNORMAL LOW (ref 22–32)
Calcium: 8.8 mg/dL — ABNORMAL LOW (ref 8.9–10.3)
Chloride: 97 mmol/L — ABNORMAL LOW (ref 98–111)
Creatinine, Ser: 1.02 mg/dL — ABNORMAL HIGH (ref 0.44–1.00)
GFR, Estimated: 57 mL/min — ABNORMAL LOW (ref >60)
Glucose, Bld: 102 mg/dL — ABNORMAL HIGH (ref 70–99)
Potassium: 3.3 mmol/L — ABNORMAL LOW (ref 3.5–5.1)
Sodium: 132 mmol/L — ABNORMAL LOW (ref 135–145)

## 2020-02-07 LAB — URINALYSIS, ROUTINE W REFLEX MICROSCOPIC
Bacteria, UA: NONE SEEN
Bilirubin Urine: NEGATIVE
Glucose, UA: 50 mg/dL — AB
Ketones, ur: 20 mg/dL — AB
Leukocytes,Ua: NEGATIVE
Nitrite: NEGATIVE
Protein, ur: 100 mg/dL — AB
Specific Gravity, Urine: 1.016 (ref 1.005–1.030)
pH: 6 (ref 5.0–8.0)

## 2020-02-07 LAB — MAGNESIUM: Magnesium: 1.6 mg/dL — ABNORMAL LOW (ref 1.7–2.4)

## 2020-02-07 LAB — ETHANOL: Alcohol, Ethyl (B): <10 mg/dL (ref <10)

## 2020-02-07 LAB — AMMONIA: Ammonia: 44 umol/L — ABNORMAL HIGH (ref 9–35)

## 2020-02-07 LAB — FOLATE: Folate: 2.2 ng/mL — ABNORMAL LOW (ref >5.9)

## 2020-02-07 LAB — VITAMIN B12: Vitamin B-12: 691 pg/mL (ref 180–914)

## 2020-02-07 LAB — CK: Total CK: 1984 U/L — ABNORMAL HIGH (ref 38–234)

## 2020-02-07 LAB — TSH: TSH: 6.677 u[IU]/mL — ABNORMAL HIGH (ref 0.350–4.500)

## 2020-02-07 LAB — PHOSPHORUS: Phosphorus: 2.5 mg/dL (ref 2.5–4.6)

## 2020-02-07 LAB — LACTIC ACID, PLASMA: Lactic Acid, Venous: 1.3 mmol/L (ref 0.5–1.9)

## 2020-02-07 LAB — PROCALCITONIN: Procalcitonin: 0.23 ng/mL

## 2020-02-07 MED ORDER — LACTULOSE ENEMA
300.0000 mL | Freq: Once | ORAL | Status: AC
  Administered 2020-02-07: 300 mL via RECTAL
  Filled 2020-02-07: qty 300

## 2020-02-07 MED ORDER — POTASSIUM CHLORIDE IN NACL 20-0.9 MEQ/L-% IV SOLN
INTRAVENOUS | Status: DC
  Filled 2020-02-07 (×3): qty 1000

## 2020-02-07 MED ORDER — MAGNESIUM SULFATE 2 GM/50ML IV SOLN
2.0000 g | Freq: Once | INTRAVENOUS | Status: AC
  Administered 2020-02-07: 2 g via INTRAVENOUS
  Filled 2020-02-07: qty 50

## 2020-02-07 MED ORDER — SODIUM CHLORIDE 0.9 % IV SOLN: Freq: Once | INTRAVENOUS | Status: DC

## 2020-02-07 NOTE — Evaluation (Signed)
Physical Therapy Evaluation Patient Details Name: Glenda Stevens MRN: [id number] DOB: 08-27-1945 Today's Date: 02/07/2020   History of Present Illness  Glenda Stevens is a 74 y.o. female with medical history significant of HTN, progressive memory loss with self neglect -fails to take medications, eat, frequent falls. Wa found by neighbors earlier on the day of admission down behind the stairs where she lives, evidently since the preceeding evening. It appears she fell. Was evidently concious at the time she was found. EMS wall called. The patient was initially very cold with Temp in the 80's. She was starting on warming blankets and transported to [facility]  Clinical Impression  Patient received in bed, lethargic. Not oriented to place, time, situation. She requires +2 max assist for supine >< sit. Poor sitting balance with posterior lean requiring max assist to maintain. She is very fearful of falling. Patient will continue to benefit from skilled PT while here to improve functional independence and safety with mobility.     Follow Up Recommendations SNF    Equipment Recommendations  Other (comment) (TBD)    Recommendations for Other Services       Precautions / Restrictions Precautions Precautions: Fall Restrictions Weight Bearing Restrictions: No      Mobility  Bed Mobility Overal bed mobility: Needs Assistance Bed Mobility: Sit to Supine;Supine to Sit     Supine to sit: Max assist;+2 for physical assistance Sit to supine: Max assist;+2 for physical assistance   General bed mobility comments: patient assisted bringing LEs back up onto bed, but otherwise requires Max +2 assist due to lethargy    Transfers                 General transfer comment: not attempted due to lethargy  Ambulation/Gait             General Gait Details: not attempted, unable  Stairs            Wheelchair Mobility    Modified Rankin (Stroke Patients Only)       Balance Overall  balance assessment: Needs assistance   Sitting balance-Leahy Scale: Zero Sitting balance - Comments: requires max assist to maintain sitting balance Postural control: Posterior lean                                   Pertinent Vitals/Pain Pain Assessment: No/denies pain    Home Living Family/patient expects to be discharged to:: Skilled nursing facility Living Arrangements: Alone Available Help at Discharge: Family;Available PRN/intermittently             Additional Comments: patient unable to provide home info/history.  No family present    Prior Function                 Hand Dominance        Extremity/Trunk Assessment   Upper Extremity Assessment Upper Extremity Assessment: Generalized weakness    Lower Extremity Assessment Lower Extremity Assessment: Generalized weakness    Cervical / Trunk Assessment Cervical / Trunk Assessment: Normal  Communication   Communication: No difficulties  Cognition Arousal/Alertness: Lethargic Behavior During Therapy: Flat affect Overall Cognitive Status: Impaired/Different from baseline Area of Impairment: Orientation;Attention;Memory                 Orientation Level: Disoriented to;Place;Time;Situation Current Attention Level: Focused Memory: Decreased short-term memory                General Comments  Exercises     Assessment/Plan    PT Assessment Patient needs continued PT services  PT Problem List Decreased strength;Decreased mobility;Decreased activity tolerance;Decreased balance;Decreased safety awareness;Decreased cognition       PT Treatment Interventions Therapeutic activities;Therapeutic exercise;Gait training;Functional mobility training;Patient/family education;DME instruction;Balance training    PT Goals (Current goals can be found in the Care Plan section)  Acute Rehab PT Goals Patient Stated Goal: none stated PT Goal Formulation: Patient unable to participate in  goal setting Time For Goal Achievement: 02/21/20    Frequency Min 2X/week   Barriers to discharge Decreased caregiver support      Co-evaluation               AM-PAC PT "6 Clicks" Mobility  Outcome Measure Help needed turning from your back to your side while in a flat bed without using bedrails?: A Lot Help needed moving from lying on your back to sitting on the side of a flat bed without using bedrails?: Total Help needed moving to and from a bed to a chair (including a wheelchair)?: Total Help needed standing up from a chair using your arms (e.g., wheelchair or bedside chair)?: Total Help needed to walk in hospital room?: Total Help needed climbing 3-5 steps with a railing? : Total 6 Click Score: 7    End of Session   Activity Tolerance: Patient limited by lethargy Patient left: in bed;with call bell/phone within reach;with bed alarm set Nurse Communication: Mobility status PT Visit Diagnosis: Muscle weakness (generalized) (M62.81);Other abnormalities of gait and mobility (R26.89);History of falling (Z91.81)    Time: 1000-1010 PT Time Calculation (min) (ACUTE ONLY): 10 min   Charges:   PT Evaluation $PT Eval Moderate Complexity: 1 Mod          Nestor Wieneke, PT, GCS 02/07/20,10:32 AM

## 2020-02-07 NOTE — Plan of Care (Signed)

## 2020-02-07 NOTE — Plan of Care (Signed)

## 2020-02-07 NOTE — TOC Initial Note (Signed)
Transition of Care [facility]) - Initial/Assessment Note    Patient Details  Name: Glenda Stevens MRN: [id number] Date of Birth: 12/03/1945  Transition of Care [facility]) CM/SW Contact:    Emeterio Reeve, [place] Phone Number: 02/07/2020, 2:45 PM  Clinical Narrative:                  CSW met with pt at bedside. CSW introduced self and explained her role at the hospital.  Pt reports PTA she was living at home alone, then later stated her son lives with her. Pt reports she was independent  With mobility and ADL's.  CSW reviewed pt/ot reccs. Pt reports she is ok with going to SNF. Pt did not have a preference of facilities. Pt gave csw permission to fax to facilities in the area. Pt reports she is covid vaccinated.   TOC will follow.   Expected Discharge Plan: Skilled Nursing Facility Barriers to Discharge: Continued Medical Work up   Patient Goals and CMS Choice Patient states their goals for this hospitalization and ongoing recovery are:: To get better CMS Medicare.gov Compare Post Acute Care list provided to:: Patient Choice offered to / list presented to : Patient  Expected Discharge Plan and Services Expected Discharge Plan: [place]                                              Prior Living Arrangements/Services   Lives with:: Self, Adult Children Patient language and need for interpreter reviewed:: Yes Do you feel safe going back to the place where you live?: Yes      Need for Family Participation in Patient Care: Yes (Comment) Care giver support system in place?: Yes (comment)   Criminal Activity/Legal Involvement Pertinent to Current Situation/Hospitalization: No - Comment as needed  Activities of Daily Living Home Assistive Devices/Equipment: Cane (specify quad or straight) ADL Screening (condition at time of admission) Is the patient deaf or have difficulty hearing?: No Does the patient have difficulty seeing, even when wearing glasses/contacts?:  No Does the patient have difficulty concentrating, remembering, or making decisions?: No Does the patient have difficulty dressing or bathing?: Yes Does the patient have difficulty walking or climbing stairs?: Yes Weakness of Legs: Both Weakness of Arms/Hands: None  Permission Sought/Granted   Permission granted to share information with : Yes, Verbal Permission Granted     Permission granted to share info w AGENCY: SNF  Permission granted to share info w Relationship: Son     Emotional Assessment Appearance:: Appears stated age Attitude/Demeanor/Rapport: Engaged Affect (typically observed): Appropriate Orientation: : Oriented to Self, Oriented to Situation, Oriented to Place, Oriented to  Time, Fluctuating Orientation (Suspected and/or reported Sundowners) Alcohol / Substance Use: Not Applicable Psych Involvement: No (comment)  Admission diagnosis:  Rhabdomyolysis [M62.82] Hypothermia, initial encounter [T68.XXXA] Fall, initial encounter [id number]] Non-traumatic rhabdomyolysis [M62.82] Patient Active Problem List   Diagnosis Date Noted   Rhabdomyolysis 01/12/2020   Hypothermia 01/10/2020   Acute lumbar back pain 11/03/2018   Blood glucose elevated 07/16/2018   Syncope and collapse 06/03/2018   Abnormal urinalysis 06/03/2018   Dizziness 06/03/2018   Poor memory 06/03/2018   Family history of dementia 06/03/2018   Healthcare maintenance 05/01/2017   Corneal abrasion, right 10/17/2016   Vitamin D deficiency 07/18/2016   Other fatigue 05/09/2016   Bilateral hip pain 05/09/2016   Tinea corporis 05/09/2016  Foot pain, bilateral 05/09/2016   Medicare annual wellness visit, initial 07/06/2015   Anxiety 07/06/2015   Rectal cancer ([place]) 08/01/2014   Colon cancer ([place]) 08/01/2014   Diarrhea 07/08/2014   Seborrheic keratoses 03/18/2014   Nasal congestion 09/14/2013   Smoker 09/14/2013   Renal artery stenosis ([place]) 08/06/2013   Hyperlipidemia  08/06/2013   Essential hypertension 08/06/2013   PCP:  Lorrene Reid, PA-C Pharmacy:   [facility], [address], [address] [place], [place] [address] Phone: [phone] Fax: [phone]  [place], [place] [address] Phone: [phone] Fax: [phone]     Social Determinants of Health (SDOH) Interventions    Readmission Risk Interventions No flowsheet data found.  Emeterio Reeve, Latanya Presser, [place] Social Worker [phone]

## 2020-02-07 NOTE — NC FL2 (Signed)
[place] LEVEL OF CARE SCREENING TOOL     IDENTIFICATION  Patient Name: Glenda Stevens Birthdate: 1945/03/29 Sex: female Admission Date (Current Location): 01/22/2020  [facility] and [place] Number:  Herbalist and Address:  [facility], [place] [address], [place], [place] [address]      Provider Number: [id number]  Attending Physician Name and Address:  Mckinley Jewel, MD  Relative Name and Phone Number:       Current Level of Care: Hospital Recommended Level of Care: [place] Prior Approval Number:    Date Approved/Denied:   PASRR Number: [id number]  Discharge Plan: SNF    Current Diagnoses: Patient Active Problem List   Diagnosis Date Noted  . Rhabdomyolysis 01/26/2020  . Hypothermia 01/11/2020  . Acute lumbar back pain 11/03/2018  . Blood glucose elevated 07/16/2018  . Syncope and collapse 06/03/2018  . Abnormal urinalysis 06/03/2018  . Dizziness 06/03/2018  . Poor memory 06/03/2018  . Family history of dementia 06/03/2018  . Healthcare maintenance 05/01/2017  . Corneal abrasion, right 10/17/2016  . Vitamin D deficiency 07/18/2016  . Other fatigue 05/09/2016  . Bilateral hip pain 05/09/2016  . Tinea corporis 05/09/2016  . Foot pain, bilateral 05/09/2016  . Medicare annual wellness visit, initial 07/06/2015  . Anxiety 07/06/2015  . Rectal cancer ([place]) 08/01/2014  . Colon cancer ([place]) 08/01/2014  . Diarrhea 07/08/2014  . Seborrheic keratoses 03/18/2014  . Nasal congestion 09/14/2013  . Smoker 09/14/2013  . Renal artery stenosis ([place]) 08/06/2013  . Hyperlipidemia 08/06/2013  . Essential hypertension 08/06/2013    Orientation RESPIRATION BLADDER Height & Weight     Self, Situation, Time, Place  Normal Continent Weight:   Height:     BEHAVIORAL SYMPTOMS/MOOD NEUROLOGICAL BOWEL NUTRITION STATUS      Continent Diet (See discharge summary)  AMBULATORY STATUS COMMUNICATION OF NEEDS Skin    Extensive Assist Verbally Normal                       Personal Care Assistance Level of Assistance  Bathing, Feeding, Dressing Bathing Assistance: Maximum assistance Feeding assistance: Limited assistance Dressing Assistance: Maximum assistance     Functional Limitations Info  Speech, Hearing, Sight Sight Info: Adequate Hearing Info: Adequate Speech Info: Adequate    SPECIAL CARE FACTORS FREQUENCY  PT (By licensed PT), OT (By licensed OT)     PT Frequency: 5x a week OT Frequency: 5x a week            Contractures Contractures Info: Not present    Additional Factors Info  Code Status Code Status Info: Full             Current Medications (02/07/2020):  This is the current hospital active medication list Current Facility-Administered Medications  Medication Dose Route Frequency Provider Last Rate Last Admin  . 0.9 % NaCl with KCl 20 mEq/ L  infusion   Intravenous Continuous Pahwani, Rinka R, MD      . acetaminophen (TYLENOL) tablet 650 mg  650 mg Oral Q6H PRN Norins, Heinz Knuckles, MD       Or  . acetaminophen (TYLENOL) suppository 650 mg  650 mg Rectal Q6H PRN Norins, Heinz Knuckles, MD      . enoxaparin (LOVENOX) injection 40 mg  40 mg Subcutaneous Q24H Norins, Heinz Knuckles, MD   40 mg at 01/23/2020 2206  . haloperidol lactate (HALDOL) injection 2 mg  2 mg Intravenous Q6H PRN Norins, Heinz Knuckles,  MD   2 mg at 02/07/20 0611  . ketorolac (TORADOL) 15 MG/ML injection 15 mg  15 mg Intravenous Q6H PRN Norins, Heinz Knuckles, MD      . propranolol (INDERAL) tablet 20 mg  20 mg Oral TID Neena Rhymes, MD   20 mg at 02/07/20 0945  . senna (SENOKOT) tablet 8.6 mg  1 tablet Oral BID Neena Rhymes, MD   8.6 mg at 02/07/20 0945  . traZODone (DESYREL) tablet 25 mg  25 mg Oral QHS PRN Norins, Heinz Knuckles, MD         Discharge Medications: Please see discharge summary for a list of discharge medications.  Relevant Imaging Results:  Relevant Lab Results:   Additional  Information SSN: [id number]  Emeterio Reeve, [place]

## 2020-02-07 NOTE — Progress Notes (Signed)
PROGRESS NOTE    Glenda Stevens  JKK:[id number] DOB: [birth year] DOA: 01/21/2020 PCP: Lorrene Reid, PA-C   Brief Narrative:  Patient is a 74 year old female with past medical history of hypertension, GERD, arthritis progressive memory loss with self-neglect was found by neighbors outside.  Patient had no idea how long she was here.  EMS was called and put warm blankets around her and brought to the patient for further evaluation and management.  Upon arrival to ED: Patient hypothermic with temperature of 91.5, blood pressure: 99/57, heart rate 92, respiratory rate 22.  Patient notable for rigors, contusion to scalp above left forehead, dry mucous membrane.  AST: 106, ALT: 81, leukocytosis of 13.9, CK: 1737, lactic acid: 2.2.  CT head: Negative for acute findings.  EKG shows sinus rhythm.  Patient admitted for further evaluation and management of rhabdomyolysis and hypothermia.  Assessment & Plan:  Acute metabolic/toxic encephalopathy: -Hypotensive, hypothermic in the setting of cold exposure.  CT head negative for acute findings. -Given IV fluids and placed on Bair hugger.  Blood pressure and temperature improved. -Has leukocytosis of 13,000, elevated lactic acid.  Chest x-ray negative for acute findings.  COVID-19 negative. -Ordered infectious and metabolic work-up-check UA, B12, folate, TSH, ammonia level, UDS, PCT, blood culture, ethanol level -Consult PT/OT-recommended SNF.  Consult SLP.  Keep her n.p.o. -On fall/aspiration precautions  Rhabdomyolysis: -CK level trended up from [phone]. -Increase IV fluid rate. -Repeat CK level tomorrow AM.  AKI:  -Likely from rhabdomyolysis. -Hold nephrotoxic medication.  Increase IV fluid rate.  Continue to monitor kidney function closely  Elevated liver enzymes: -AST trended up from 106-191, ALT trended up from 81-108.  Total bilirubin: 1.3. -We will get right upper quadrant ultrasound -Hold atorvastatin.  Repeat CMP tomorrow  AM.  Hypokalemia/hyponatremia: -Change IV fluids from the half-normal saline to normal saline +20 KCl. -Repeat CMP tomorrow AM.  Check magnesium and phosphorus level.  Hypertension: Blood pressure is stable. -On irbesartan and propranolol at home-continued at the time of admission. -Hold irbesartan due to elevated CK level and worsening kidney function.  Hyperlipidemia: Hold statin due to elevated liver enzymes  Progressive dementia: -Supportive care. -Hold off neurology consult at this time.  Tobacco abuse:  -counseled about cessation.  Alcohol use: 2 glass of wine every night -never been a heavy drinker as per son, no illicit drugs-former marijuana use-not anymore.  DVT prophylaxis: Lovenox Code Status: Full code Family Communication:  None present at bedside.  Plan of care discussed with patient in length and he verbalized understanding and agreed with it.  I called patient's son & discussed plan of care & he verbalized understanding.  Disposition Plan: Likely SNF, pending work-up  Consultants:   None  Procedures:   CT head  Antimicrobials:   None  Status is: Inpatient  Dispo: The patient is from: Home              Anticipated d/c is to: SNF              Anticipated d/c date is: 3 days              Patient currently is not medically stable to d/c.   Subjective: Patient seen and examined.  Sleepy but arousable, slow to respond, oriented to time only.  Denies any complaints.  Objective: Vitals:   01/10/2020 2152 02/07/20 0145 02/07/20 0829 02/07/20 1344  BP: (!) 142/90 139/79 (!) 147/80 (!) 161/84  Pulse: (!) 110 (!) 109 98 96  Resp: 16 17 18  18  Temp: 99.3 F (37.4 C) 99 F (37.2 C) 98 F (36.7 C) 98 F (36.7 C)  TempSrc: Oral Oral    SpO2: 99% 100%  100%    Intake/Output Summary (Last 24 hours) at 02/07/2020 1518 Last data filed at 02/07/2020 0300 Gross per 24 hour  Intake 1550 ml  Output --  Net 1550 ml   There were no vitals filed for this  visit.  Examination:  General exam: Appears calm and comfortable, on room air, elderly, appears weak, lethargic, sleepy, slow to respond Respiratory system: Clear to auscultation. Respiratory effort normal. Cardiovascular system: S1 & S2 heard, RRR. No JVD, murmurs, rubs, gallops or clicks. No pedal edema. Gastrointestinal system: Abdomen is nondistended, soft and nontender. No organomegaly or masses felt. Normal bowel sounds heard. Central nervous system: Alert and oriented x1.  Extremities: Symmetric 5 x 5 power.   Data Reviewed: I have personally reviewed following labs and imaging studies  CBC: Recent Labs  Lab 01/19/2020 1627 02/07/20 0855  WBC 13.0* 14.8*  NEUTROABS 10.8* 11.5*  HGB 11.9* 11.6*  HCT 36.4 33.9*  MCV 84.7 82.1  PLT 210 314   Basic Metabolic Panel: Recent Labs  Lab 01/26/2020 1627 02/07/20 0855  NA 135 132*  K 3.9 3.3*  CL 97* 97*  CO2 24 18*  GLUCOSE 93 102*  BUN 24* 17  CREATININE 0.81 1.02*  CALCIUM 9.0 8.8*   GFR: CrCl cannot be calculated (Unknown ideal weight.). Liver Function Tests: Recent Labs  Lab 01/13/2020 1627 02/07/20 0855  AST 106* 191*  ALT 81* 108*  ALKPHOS 59 59  BILITOT 1.0 1.3*  PROT 6.2* 5.9*  ALBUMIN 3.3* 3.1*   No results for input(s): LIPASE, AMYLASE in the last 168 hours. No results for input(s): AMMONIA in the last 168 hours. Coagulation Profile: No results for input(s): INR, PROTIME in the last 168 hours. Cardiac Enzymes: Recent Labs  Lab 02/04/2020 1627 02/07/20 0855  CKTOTAL 1,737* 1,984*   BNP (last 3 results) No results for input(s): PROBNP in the last 8760 hours. HbA1C: No results for input(s): HGBA1C in the last 72 hours. CBG: No results for input(s): GLUCAP in the last 168 hours. Lipid Profile: No results for input(s): CHOL, HDL, LDLCALC, TRIG, CHOLHDL, LDLDIRECT in the last 72 hours. Thyroid Function Tests: No results for input(s): TSH, T4TOTAL, FREET4, T3FREE, THYROIDAB in the last 72  hours. Anemia Panel: No results for input(s): VITAMINB12, FOLATE, FERRITIN, TIBC, IRON, RETICCTPCT in the last 72 hours. Sepsis Labs: Recent Labs  Lab 01/22/2020 1627 02/07/20 0855  LATICACIDVEN 2.2* 1.3    Recent Results (from the past 240 hour(s))  Resp Panel by RT-PCR (Flu A&B, Covid) Nasopharyngeal Swab     Status: None   Collection Time: 01/21/2020  2:23 PM   Specimen: Nasopharyngeal Swab; Nasopharyngeal(NP) swabs in vial transport medium  Result Value Ref Range Status   SARS Coronavirus 2 by RT PCR NEGATIVE NEGATIVE Final    Comment: (NOTE) SARS-CoV-2 target nucleic acids are NOT DETECTED.  The SARS-CoV-2 RNA is generally detectable in upper respiratory specimens during the acute phase of infection. The lowest concentration of SARS-CoV-2 viral copies this assay can detect is 138 copies/mL. A negative result does not preclude SARS-Cov-2 infection and should not be used as the sole basis for treatment or other patient management decisions. A negative result may occur with  improper specimen collection/handling, submission of specimen other than nasopharyngeal swab, presence of viral mutation(s) within the areas targeted by this assay, and inadequate number of  viral copies(<138 copies/mL). A negative result must be combined with clinical observations, patient history, and epidemiological information. The expected result is Negative.  Fact Sheet for Patients:  EntrepreneurPulse.com.au  Fact Sheet for Healthcare Providers:  IncredibleEmployment.be  This test is no t yet approved or cleared by the [place] FDA and  has been authorized for detection and/or diagnosis of SARS-CoV-2 by FDA under an Emergency Use Authorization (EUA). This EUA will remain  in effect (meaning this test can be used) for the duration of the COVID-19 declaration under Section 564(b)(1) of the Act, 21 U.S.C.section 360bbb-3(b)(1), unless the authorization is  terminated  or revoked sooner.       Influenza A by PCR NEGATIVE NEGATIVE Final   Influenza B by PCR NEGATIVE NEGATIVE Final    Comment: (NOTE) The Xpert Xpress SARS-CoV-2/FLU/RSV plus assay is intended as an aid in the diagnosis of influenza from Nasopharyngeal swab specimens and should not be used as a sole basis for treatment. Nasal washings and aspirates are unacceptable for Xpert Xpress SARS-CoV-2/FLU/RSV testing.  Fact Sheet for Patients: EntrepreneurPulse.com.au  Fact Sheet for Healthcare Providers: IncredibleEmployment.be  This test is not yet approved or cleared by the [place] FDA and has been authorized for detection and/or diagnosis of SARS-CoV-2 by FDA under an Emergency Use Authorization (EUA). This EUA will remain in effect (meaning this test can be used) for the duration of the COVID-19 declaration under Section 564(b)(1) of the Act, 21 U.S.C. section 360bbb-3(b)(1), unless the authorization is terminated or revoked.  Performed at [facility], [place] [address] [place], [place] [address]       Radiology Studies: CT Head Wo Contrast  Result Date: 01/12/2020 CLINICAL DATA:  Altered mental status.  Recent fall EXAM: CT HEAD WITHOUT CONTRAST TECHNIQUE: Contiguous axial images were obtained from the base of the skull through the vertex without intravenous contrast. COMPARISON:  December 12, 2017 head CT.  Brain MRI December 25, 2005 FINDINGS: Brain: Stable mild to moderate diffuse atrophy. There is no intracranial mass, hemorrhage, extra-axial fluid collection, or midline shift. There is small vessel disease throughout the centra semiovale bilaterally. Small vessel disease is noted in the anterior limbs of each external capsule and internal capsule. No acute infarct is appreciable. Vascular: No hyperdense vessel. There is calcification in each carotid siphon region. Skull: Bony calvarium appears intact. Sinuses/Orbits:  Previous erosion and remodeling of the orbital floor regions with well-defined infraorbital masses, likely schwannoma is given previous appearance. Lesions are stable compared to 2007. Paranasal sinuses clear. Other: There is opacification in multiple mastoid air cells on the left. There are several mastoid air cells inferiorly opacified on the right. IMPRESSION: 1. 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 No acute infarct. No intra-axial mass or hemorrhage. 2. 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 3.  Foci of arterial vascular calcification noted. 4. Opacification of multiple mastoid air cells, more severe on the left than the right. Electronically Signed   By: Lowella Grip III M.D.   On: 01/22/2020 15:37   DG Pelvis Portable  Result Date: 01/13/2020 CLINICAL DATA:  Unwitnessed fall EXAM: PORTABLE PELVIS 1-2 VIEWS COMPARISON:  08/04/2014 FINDINGS: There is no evidence of pelvic fracture or diastasis. No pelvic bone lesions are seen. IMPRESSION: Negative. Electronically Signed   By: Davina Poke D.O.   On: 01/25/2020 16:11   DG Chest Portable 1 View  Result Date: 01/29/2020 CLINICAL DATA:  Altered mental  status. EXAM: PORTABLE CHEST 1 VIEW COMPARISON:  Chest CT Aug 04, 2014 FINDINGS: Cardiomediastinal silhouette is normal. Mediastinal contours appear intact. Calcific atherosclerotic disease and tortuosity of the aorta. There is no evidence of focal airspace consolidation, pleural effusion or pneumothorax. Osseous structures are without acute abnormality. Soft tissues are grossly normal. IMPRESSION: 1. No active disease. 2. Calcific atherosclerotic disease and tortuosity of the aorta. Electronically Signed   By: Fidela Salisbury M.D.   On: 01/20/2020 14:43    Scheduled Meds: . enoxaparin (LOVENOX) injection  40 mg Subcutaneous Q24H  . propranolol  20 mg Oral TID  . senna  1 tablet Oral  BID   Continuous Infusions: . 0.9 % NaCl with KCl 20 mEq / L 125 mL/hr at 02/07/20 1439     LOS: 1 day   Time spent: 45 minutes   Janilah Hojnacki Loann Quill, MD [facility]  If 7PM-7AM, please contact night-coverage [email or web address] 02/07/2020, 3:18 PM

## 2020-02-07 NOTE — Progress Notes (Signed)
PROGRESS NOTE    Glenda Stevens  DTO:[id number] DOB: [birth year] DOA: 02/03/2020 PCP: Lorrene Reid, PA-C   Brief Narrative:  Patient is a 74 year old female with past medical history of hypertension, GERD, arthritis progressive memory loss with self-neglect was found by neighbors outside.  Patient had no idea how long she was here.  EMS was called and put warm blankets around her and brought to the patient for further evaluation and management.  Upon arrival to ED: Patient hypothermic with temperature of 91.5, blood pressure: 99/57, heart rate 92, respiratory rate 22.  Patient notable for rigors, contusion to scalp above left forehead, dry mucous membrane.  AST: 106, ALT: 81, leukocytosis of 13.9, CK: 1737, lactic acid: 2.2.  CT head: Negative for acute findings.  EKG shows sinus rhythm.  Patient admitted for further evaluation and management of rhabdomyolysis and hypothermia.  Assessment & Plan:  Acute metabolic/toxic encephalopathy: -Hypotensive, hypothermic upon arrival.  CT head negative for acute findings. -Given IV fluids and placed on Bair hugger.  Blood pressure and temperature improved. -Has leukocytosis of 13,000, elevated lactic acid.  Chest x-ray negative for acute findings.  COVID-19 negative. -Ordered infectious and metabolic work-up-check UA, B12, folate, TSH, ammonia level, UDS, PCT, blood culture, ethanol level -Consult PT/OT-recommended SNF.  Consult SLP.  Keep her n.p.o. -On fall/aspiration precautions  Rhabdomyolysis: -CK level trended up from [phone]. -Increase IV fluid rate. -Repeat CK level tomorrow AM.  AKI:  -Likely from rhabdomyolysis. -Hold nephrotoxic medication.  Increase IV fluid rate.  Continue to monitor kidney function closely  Elevated liver enzymes: -AST trended up from 106-191, ALT trended up from 81-108.  Total bilirubin: 1.3. -We will get right upper quadrant ultrasound -Hold atorvastatin.  Repeat CMP tomorrow  AM.  Hypokalemia/hyponatremia: -Change IV fluids from the half-normal saline to normal saline +20 KCl. -Repeat CMP tomorrow AM.  Check magnesium and phosphorus level.  Hypertension: Blood pressure is stable. -On irbesartan and propranolol at home-continued at the time of admission. -Hold irbesartan due to elevated CK level and worsening kidney function.  Hyperlipidemia: Hold statin due to elevated liver enzymes  Progressive dementia: -Supportive care. -Hold off neurology consult at this time.  DVT prophylaxis: Lovenox Code Status: Full code Family Communication:  None present at bedside.  Plan of care discussed with patient in length and he verbalized understanding and agreed with it.  I called patient's son to discuss plan of care with no response  Disposition Plan: Likely SNF, pending work-up  Consultants:   None  Procedures:   CT head  Antimicrobials:   None  Status is: Inpatient  Dispo: The patient is from: Home              Anticipated d/c is to: SNF              Anticipated d/c date is: 3 days              Patient currently is not medically stable to d/c.   Subjective: Patient seen and examined.  Sleepy but arousable, slow to respond, oriented to time only.  Denies any complaints.  Objective: Vitals:   01/15/2020 2139 02/05/2020 2152 02/07/20 0145 02/07/20 0829  BP: (!) 100/55 (!) 142/90 139/79 (!) 147/80  Pulse: 100 (!) 110 (!) 109 98  Resp: 19 16 17 18   Temp: 98.5 F (36.9 C) 99.3 F (37.4 C) 99 F (37.2 C) 98 F (36.7 C)  TempSrc: Oral Oral Oral   SpO2: 99% 99% 100%     Intake/Output Summary (  Last 24 hours) at 02/07/2020 1151 Last data filed at 02/07/2020 0300 Gross per 24 hour  Intake 1550 ml  Output --  Net 1550 ml   There were no vitals filed for this visit.  Examination:  General exam: Appears calm and comfortable, on room air, elderly, appears weak, lethargic, sleepy, slow to respond Respiratory system: Clear to auscultation.  Respiratory effort normal. Cardiovascular system: S1 & S2 heard, RRR. No JVD, murmurs, rubs, gallops or clicks. No pedal edema. Gastrointestinal system: Abdomen is nondistended, soft and nontender. No organomegaly or masses felt. Normal bowel sounds heard. Central nervous system: Alert and oriented x1.  Extremities: Symmetric 5 x 5 power.   Data Reviewed: I have personally reviewed following labs and imaging studies  CBC: Recent Labs  Lab 01/22/2020 1627 02/07/20 0855  WBC 13.0* 14.8*  NEUTROABS 10.8* 11.5*  HGB 11.9* 11.6*  HCT 36.4 33.9*  MCV 84.7 82.1  PLT 210 542   Basic Metabolic Panel: Recent Labs  Lab 02/07/2020 1627 02/07/20 0855  NA 135 132*  K 3.9 3.3*  CL 97* 97*  CO2 24 18*  GLUCOSE 93 102*  BUN 24* 17  CREATININE 0.81 1.02*  CALCIUM 9.0 8.8*   GFR: CrCl cannot be calculated (Unknown ideal weight.). Liver Function Tests: Recent Labs  Lab 02/04/2020 1627 02/07/20 0855  AST 106* 191*  ALT 81* 108*  ALKPHOS 59 59  BILITOT 1.0 1.3*  PROT 6.2* 5.9*  ALBUMIN 3.3* 3.1*   No results for input(s): LIPASE, AMYLASE in the last 168 hours. No results for input(s): AMMONIA in the last 168 hours. Coagulation Profile: No results for input(s): INR, PROTIME in the last 168 hours. Cardiac Enzymes: Recent Labs  Lab 01/28/2020 1627 02/07/20 0855  CKTOTAL 1,737* 1,984*   BNP (last 3 results) No results for input(s): PROBNP in the last 8760 hours. HbA1C: No results for input(s): HGBA1C in the last 72 hours. CBG: No results for input(s): GLUCAP in the last 168 hours. Lipid Profile: No results for input(s): CHOL, HDL, LDLCALC, TRIG, CHOLHDL, LDLDIRECT in the last 72 hours. Thyroid Function Tests: No results for input(s): TSH, T4TOTAL, FREET4, T3FREE, THYROIDAB in the last 72 hours. Anemia Panel: No results for input(s): VITAMINB12, FOLATE, FERRITIN, TIBC, IRON, RETICCTPCT in the last 72 hours. Sepsis Labs: Recent Labs  Lab 01/15/2020 1627 02/07/20 0855   LATICACIDVEN 2.2* 1.3    Recent Results (from the past 240 hour(s))  Resp Panel by RT-PCR (Flu A&B, Covid) Nasopharyngeal Swab     Status: None   Collection Time: 01/10/2020  2:23 PM   Specimen: Nasopharyngeal Swab; Nasopharyngeal(NP) swabs in vial transport medium  Result Value Ref Range Status   SARS Coronavirus 2 by RT PCR NEGATIVE NEGATIVE Final    Comment: (NOTE) SARS-CoV-2 target nucleic acids are NOT DETECTED.  The SARS-CoV-2 RNA is generally detectable in upper respiratory specimens during the acute phase of infection. The lowest concentration of SARS-CoV-2 viral copies this assay can detect is 138 copies/mL. A negative result does not preclude SARS-Cov-2 infection and should not be used as the sole basis for treatment or other patient management decisions. A negative result may occur with  improper specimen collection/handling, submission of specimen other than nasopharyngeal swab, presence of viral mutation(s) within the areas targeted by this assay, and inadequate number of viral copies(<138 copies/mL). A negative result must be combined with clinical observations, patient history, and epidemiological information. The expected result is Negative.  Fact Sheet for Patients:  EntrepreneurPulse.com.au  Fact Sheet for Healthcare  Providers:  IncredibleEmployment.be  This test is no t yet approved or cleared by the [place] and  has been authorized for detection and/or diagnosis of SARS-CoV-2 by FDA under an Emergency Use Authorization (EUA). This EUA will remain  in effect (meaning this test can be used) for the duration of the COVID-19 declaration under Section 564(b)(1) of the Act, 21 U.S.C.section 360bbb-3(b)(1), unless the authorization is terminated  or revoked sooner.       Influenza A by PCR NEGATIVE NEGATIVE Final   Influenza B by PCR NEGATIVE NEGATIVE Final    Comment: (NOTE) The Xpert Xpress SARS-CoV-2/FLU/RSV plus  assay is intended as an aid in the diagnosis of influenza from Nasopharyngeal swab specimens and should not be used as a sole basis for treatment. Nasal washings and aspirates are unacceptable for Xpert Xpress SARS-CoV-2/FLU/RSV testing.  Fact Sheet for Patients: EntrepreneurPulse.com.au  Fact Sheet for Healthcare Providers: IncredibleEmployment.be  This test is not yet approved or cleared by the [place] FDA and has been authorized for detection and/or diagnosis of SARS-CoV-2 by FDA under an Emergency Use Authorization (EUA). This EUA will remain in effect (meaning this test can be used) for the duration of the COVID-19 declaration under Section 564(b)(1) of the Act, 21 U.S.C. section 360bbb-3(b)(1), unless the authorization is terminated or revoked.  Performed at [facility], [place] [address] [place], [place] [address]       Radiology Studies: CT Head Wo Contrast  Result Date: 01/20/2020 CLINICAL DATA:  Altered mental status.  Recent fall EXAM: CT HEAD WITHOUT CONTRAST TECHNIQUE: Contiguous axial images were obtained from the base of the skull through the vertex without intravenous contrast. COMPARISON:  December 12, 2017 head CT.  Brain MRI December 25, 2005 FINDINGS: Brain: Stable mild to moderate diffuse atrophy. There is no intracranial mass, hemorrhage, extra-axial fluid collection, or midline shift. There is small vessel disease throughout the centra semiovale bilaterally. Small vessel disease is noted in the anterior limbs of each external capsule and internal capsule. No acute infarct is appreciable. Vascular: No hyperdense vessel. There is calcification in each carotid siphon region. Skull: Bony calvarium appears intact. Sinuses/Orbits: Previous erosion and remodeling of the orbital floor regions with well-defined infraorbital masses, likely schwannoma is given previous appearance. Lesions are stable compared to 2007. Paranasal  sinuses clear. Other: There is opacification in multiple mastoid air cells on the left. There are several mastoid air cells inferiorly opacified on the right. IMPRESSION: 1. 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 No acute infarct. No intra-axial mass or hemorrhage. 2. 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 3.  Foci of arterial vascular calcification noted. 4. Opacification of multiple mastoid air cells, more severe on the left than the right. Electronically Signed   By: Lowella Grip III M.D.   On: 02/07/2020 15:37   DG Pelvis Portable  Result Date: 02/05/2020 CLINICAL DATA:  Unwitnessed fall EXAM: PORTABLE PELVIS 1-2 VIEWS COMPARISON:  08/04/2014 FINDINGS: There is no evidence of pelvic fracture or diastasis. No pelvic bone lesions are seen. IMPRESSION: Negative. Electronically Signed   By: Davina Poke D.O.   On: 02/08/2020 16:11   DG Chest Portable 1 View  Result Date: 01/21/2020 CLINICAL DATA:  Altered mental status. EXAM: PORTABLE CHEST 1 VIEW COMPARISON:  Chest CT Aug 04, 2014 FINDINGS: Cardiomediastinal silhouette is normal. Mediastinal contours appear intact. Calcific atherosclerotic disease and tortuosity of the aorta. There is no evidence  of focal airspace consolidation, pleural effusion or pneumothorax. Osseous structures are without acute abnormality. Soft tissues are grossly normal. IMPRESSION: 1. No active disease. 2. Calcific atherosclerotic disease and tortuosity of the aorta. Electronically Signed   By: Fidela Salisbury M.D.   On: 02/04/2020 14:43    Scheduled Meds: . atorvastatin  20 mg Oral QHS  . enoxaparin (LOVENOX) injection  40 mg Subcutaneous Q24H  . irbesartan  75 mg Oral Daily  . propranolol  20 mg Oral TID  . senna  1 tablet Oral BID   Continuous Infusions: . 0.9 % NaCl with KCl 20 mEq / L       LOS: 1 day   Time spent: 45 minutes   Sherlon Nied Loann Quill, MD [facility]  If 7PM-7AM, please contact night-coverage [email or web address] 02/07/2020, 11:51 AM

## 2020-02-07 NOTE — Evaluation (Signed)
Occupational Therapy Evaluation Patient Details Name: Glenda Stevens MRN: [id number] DOB: 06-18-1945 Today's Date: 02/07/2020    History of Present Illness Glenda Stevens is a 74 y.o. female with medical history significant of HTN, progressive memory loss with self neglect -fails to take medications, eat, frequent falls. Wa found by neighbors earlier on the day of admission down behind the stairs where she lives, evidently since the preceeding evening. It appears she fell. Was evidently concious at the time she was found. EMS wall called. The patient was initially very cold with Temp in the 80's. She was starting on warming blankets and transported to [facility]   Clinical Impression   PTA, pt lives alone and has hx of frequent falls and short term memory deficits impacting ability to complete daily tasks. Pt unable to provide history, so PLOF obtained from chart review. Pt presents now lethargic with deficits in balance, cognition and strength. Pt overall Max A x 2 for bed mobility with heavy posterior lean. Pt reports a fear of falling while sitting EOB. Pt requires Max A for UB ADLs and Total A for LB ADLs. Based on presentation today, pt also requires assist for self feeding. Unable to progress OOB today due to lethargy and balance - will attempt during next session. Recommend SNF for short term rehab to maximize independence with daily tasks.     Follow Up Recommendations  SNF;Supervision/Assistance - 24 hour    Equipment Recommendations  Other (comment) (to be determined)    Recommendations for Other Services       Precautions / Restrictions Precautions Precautions: Fall Restrictions Weight Bearing Restrictions: No      Mobility Bed Mobility Overal bed mobility: Needs Assistance Bed Mobility: Sit to Supine;Supine to Sit     Supine to sit: Max assist;+2 for physical assistance Sit to supine: Max assist;+2 for physical assistance   General bed mobility comments: patient assisted bringing  LEs back up onto bed, but otherwise requires Max +2 assist due to lethargy    Transfers                 General transfer comment: not attempted due to lethargy    Balance Overall balance assessment: Needs assistance Sitting-balance support: No upper extremity supported;Feet supported;Bilateral upper extremity supported Sitting balance-Leahy Scale: Zero Sitting balance - Comments: requires max assist to maintain sitting balance Postural control: Posterior lean                                 ADL either performed or assessed with clinical judgement   ADL Overall ADL's : Needs assistance/impaired Eating/Feeding: Moderate assistance;Bed level   Grooming: Moderate assistance;Bed level;Wash/dry face Grooming Details (indicate cue type and reason): Mod A for thoroughness of washing face. Able to initiate with cues and placement of washcloth in hand Upper Body Bathing: Maximal assistance;Bed level   Lower Body Bathing: Total assistance;Bed level   Upper Body Dressing : Maximal assistance;Bed level   Lower Body Dressing: Total assistance;Bed level Lower Body Dressing Details (indicate cue type and reason): Total A for donning socks in bed     Toileting- Clothing Manipulation and Hygiene: Total assistance;Bed level         General ADL Comments: Pt limited by cognition (and lethargy), as well as weakness, balance deficits and fear of falling     Vision Patient Visual Report: No change from baseline Vision Assessment?: No apparent visual deficits     Perception  Praxis      Pertinent Vitals/Pain Pain Assessment: Faces Faces Pain Scale: No hurt Pain Intervention(s): Monitored during session     Hand Dominance     Extremity/Trunk Assessment Upper Extremity Assessment Upper Extremity Assessment: Generalized weakness   Lower Extremity Assessment Lower Extremity Assessment: Defer to PT evaluation   Cervical / Trunk Assessment Cervical / Trunk  Assessment: Normal   Communication Communication Communication: No difficulties   Cognition Arousal/Alertness: Lethargic Behavior During Therapy: Flat affect Overall Cognitive Status: No family/caregiver present to determine baseline cognitive functioning Area of Impairment: Orientation;Attention;Memory;Following commands;Awareness                 Orientation Level: Disoriented to;Place;Time;Situation Current Attention Level: Focused Memory: Decreased short-term memory Following Commands: Follows one step commands with increased time   Awareness: Intellectual   General Comments: Pt overall flat, lethargic (may be due to meds). Pt unable to recall where she is or why.    General Comments       Exercises     Shoulder Instructions      Home Living Family/patient expects to be discharged to:: Skilled nursing facility Living Arrangements: Alone Available Help at Discharge: Family;Available PRN/intermittently                             Additional Comments: Pt unable to assist in providing home setup or PLOF. Per chart, pt lives alone and son checks in intermittently.      Prior Functioning/Environment          Comments: Unsure of PLOF as pt unable to provide and no family present. Per chart review, pt has frequent falls at home and memory deficits impacting ability to safely care for self (forgetting to eat, take meds, etc)        OT Problem List: Decreased strength;Decreased activity tolerance;Impaired balance (sitting and/or standing);Decreased coordination;Decreased cognition;Decreased safety awareness      OT Treatment/Interventions: Self-care/ADL training;Therapeutic exercise;DME and/or AE instruction;Therapeutic activities;Patient/family education;Balance training    OT Goals(Current goals can be found in the care plan section) Acute Rehab OT Goals Patient Stated Goal: none stated OT Goal Formulation: Patient unable to participate in goal  setting Time For Goal Achievement: 02/21/20 Potential to Achieve Goals: Good ADL Goals Pt Will Perform Eating: Independently;sitting Pt Will Perform Grooming: with supervision;sitting Pt Will Perform Upper Body Bathing: with supervision;sitting Pt Will Perform Upper Body Dressing: with supervision;sitting Pt Will Perform Lower Body Dressing: with min assist;sitting/lateral leans;sit to/from stand Additional ADL Goal #1: Pt to demonstrate ability to maintain sitting balance EOB with no more than Min A to improve ADL performance. Additional ADL Goal #2: Pt to demonstrate sit to stand transfer with no more than Mod A in preparation for ADL transfers  OT Frequency: Min 2X/week   Barriers to D/C:            Co-evaluation PT/OT/SLP Co-Evaluation/Treatment: Yes Reason for Co-Treatment: Necessary to address cognition/behavior during functional activity;For patient/therapist safety;To address functional/ADL transfers   OT goals addressed during session: ADL's and self-care      AM-PAC OT "6 Clicks" Daily Activity     Outcome Measure Help from another person eating meals?: A Lot Help from another person taking care of personal grooming?: A Lot Help from another person toileting, which includes using toliet, bedpan, or urinal?: Total Help from another person bathing (including washing, rinsing, drying)?: A Lot Help from another person to put on and taking off regular upper body clothing?:  A Lot Help from another person to put on and taking off regular lower body clothing?: Total 6 Click Score: 10   End of Session Nurse Communication: Mobility status;Other (comment) (arousal level)  Activity Tolerance: Patient limited by lethargy Patient left: in bed;with call bell/phone within reach;with bed alarm set;Other (comment) (low bed, fall mat in place)  OT Visit Diagnosis: Unsteadiness on feet (R26.81);Other abnormalities of gait and mobility (R26.89);Repeated falls (R29.6);Muscle weakness  (generalized) (M62.81);History of falling (Z91.81);Other symptoms and signs involving cognitive function                Time: 1000-1010 OT Time Calculation (min): 10 min Charges:  OT General Charges $OT Visit: 1 Visit OT Evaluation $OT Eval Moderate Complexity: 1 Mod  Layla Maw, OTR/L  Layla Maw 02/07/2020, 10:42 AM

## 2020-02-08 ENCOUNTER — Inpatient Hospital Stay (HOSPITAL_COMMUNITY): Payer: Medicare Other

## 2020-02-08 DIAGNOSIS — I10 Essential (primary) hypertension: Secondary | ICD-10-CM | POA: Diagnosis not present

## 2020-02-08 DIAGNOSIS — T796XXA Traumatic ischemia of muscle, initial encounter: Secondary | ICD-10-CM | POA: Diagnosis not present

## 2020-02-08 DIAGNOSIS — F419 Anxiety disorder, unspecified: Secondary | ICD-10-CM | POA: Diagnosis not present

## 2020-02-08 DIAGNOSIS — T68XXXA Hypothermia, initial encounter: Secondary | ICD-10-CM | POA: Diagnosis not present

## 2020-02-08 LAB — HEPATITIS PANEL, ACUTE
HCV Ab: NONREACTIVE
Hep A IgM: NONREACTIVE
Hep B C IgM: NONREACTIVE
Hepatitis B Surface Ag: NONREACTIVE

## 2020-02-08 LAB — CBC
HCT: 34.7 % — ABNORMAL LOW (ref 36.0–46.0)
Hemoglobin: 11.3 g/dL — ABNORMAL LOW (ref 12.0–15.0)
MCH: 27.5 pg (ref 26.0–34.0)
MCHC: 32.6 g/dL (ref 30.0–36.0)
MCV: 84.4 fL (ref 80.0–100.0)
Platelets: 168 10*3/uL (ref 150–400)
RBC: 4.11 MIL/uL (ref 3.87–5.11)
RDW: 14.8 % (ref 11.5–15.5)
WBC: 12 10*3/uL — ABNORMAL HIGH (ref 4.0–10.5)
nRBC: 0 % (ref 0.0–0.2)

## 2020-02-08 LAB — COMPREHENSIVE METABOLIC PANEL
ALT: 106 U/L — ABNORMAL HIGH (ref 0–44)
AST: 176 U/L — ABNORMAL HIGH (ref 15–41)
Albumin: 3 g/dL — ABNORMAL LOW (ref 3.5–5.0)
Alkaline Phosphatase: 52 U/L (ref 38–126)
Anion gap: 8 (ref 5–15)
BUN: 13 mg/dL (ref 8–23)
CO2: 22 mmol/L (ref 22–32)
Calcium: 8.4 mg/dL — ABNORMAL LOW (ref 8.9–10.3)
Chloride: 99 mmol/L (ref 98–111)
Creatinine, Ser: 0.73 mg/dL (ref 0.44–1.00)
GFR, Estimated: >60 mL/min (ref >60)
Glucose, Bld: 98 mg/dL (ref 70–99)
Potassium: 3.1 mmol/L — ABNORMAL LOW (ref 3.5–5.1)
Sodium: 129 mmol/L — ABNORMAL LOW (ref 135–145)
Total Bilirubin: 0.9 mg/dL (ref 0.3–1.2)
Total Protein: 5.8 g/dL — ABNORMAL LOW (ref 6.5–8.1)

## 2020-02-08 LAB — RPR: RPR Ser Ql: NONREACTIVE

## 2020-02-08 LAB — CK: Total CK: 1530 U/L — ABNORMAL HIGH (ref 38–234)

## 2020-02-08 LAB — AMMONIA: Ammonia: 20 umol/L (ref 9–35)

## 2020-02-08 LAB — SODIUM, URINE, RANDOM: Sodium, Ur: 73 mmol/L

## 2020-02-08 LAB — OSMOLALITY, URINE: Osmolality, Ur: 582 mOsm/kg (ref 300–900)

## 2020-02-08 LAB — MAGNESIUM: Magnesium: 2.3 mg/dL (ref 1.7–2.4)

## 2020-02-08 LAB — PROCALCITONIN: Procalcitonin: 0.17 ng/mL

## 2020-02-08 MED ORDER — POTASSIUM CHLORIDE IN NACL 40-0.9 MEQ/L-% IV SOLN
INTRAVENOUS | Status: DC
  Filled 2020-02-08 (×6): qty 1000

## 2020-02-08 NOTE — Progress Notes (Signed)
PROGRESS NOTE    Glenda Stevens  PPI:[id number] DOB: 07/09/1945 DOA: 02/05/2020 PCP: Lorrene Reid, PA-C   Brief Narrative:  Patient is a 74 year old female with past medical history of hypertension, GERD, arthritis progressive memory loss with self-neglect was found by neighbors outside.  Patient had no idea how long she was here.  EMS was called and put warm blankets around her and brought to the patient for further evaluation and management.  Upon arrival to ED: Patient hypothermic with temperature of 91.5, blood pressure: 99/57, heart rate 92, respiratory rate 22.  Patient notable for rigors, contusion to scalp above left forehead, dry mucous membrane.  AST: 106, ALT: 81, leukocytosis of 13.9, CK: 1737, lactic acid: 2.2.  CT head: Negative for acute findings.  EKG shows sinus rhythm.  Patient admitted for further evaluation and management of rhabdomyolysis and hypothermia.  Assessment & Plan:  Acute metabolic/toxic encephalopathy: -Hypotensive, hypothermic in the setting of cold exposure.  CT head negative for acute findings. -Given IV fluids and placed on Bair hugger.  Blood pressure and temperature improved. -Upon arrival leukocytosis of 13,000, elevated lactic acid.  Chest x-ray negative for acute findings.  COVID-19 negative. -Work-up including UA: Negative, B12, folate, TSH, ethanol: WNL  -Ammonia level elevated at 44 was given lactulose per rectal once.  Ammonia level: Improved.  UDS positive for benzos.  Blood culture negative so far.  -Leukocytosis improved from 14.8-12.0.  She remained afebrile.  LA trended down. -Consult PT/OT-recommended SNF.  Evaluated by SLP recommended regular solids.  Thin liquids.  No limitations for aspiration risk. -On fall/aspiration precautions  Rhabdomyolysis: -CK level initially trended up from [phone] then trended down to 1530. -Increase IV fluid rate. -Repeat CK level tomorrow AM.  AKI:  -Likely from rhabdomyolysis.  Resolved with IV  fluids. -Hold nephrotoxic medication.    Elevated liver enzymes: -Improving -AST trended up from 106-191-176, ALT trended up from 81-108-106.  Total bilirubin: 1.3-0.9. -Right upper quadrant ultrasound came back within normal limit.  Ammonia level: Trended down after lactulose.  Will check acute hepatitis panel.  Continue to monitor liver function closely.  Hold atorvastatin.  Hyponatremia: Sodium level trended down from 1 32-1 29. -We will check urine osmolality, urine sodium and serum osmolality.  Hypokalemia: Potassium 3.1.  Magnesium: WNL.  Will replenish and repeat BMP tomorrow a.m.  Hypertension: Blood pressure is stable. -On irbesartan and propranolol at home- -blood pressure is stable.  Continue to hold irbesartan at this time.  Hyperlipidemia: Hold statin due to elevated liver enzymes  Progressive dementia: -Supportive care. -Hold off neurology consult at this time.  Tobacco abuse:  -counseled about cessation.  Alcohol use: 2 glass of wine every night -never been a heavy drinker as per son, no illicit drugs-former marijuana use-not anymore.  Ethanol level: WNL.  Patient mentation has improved.  Vitals remained stable.  Monitor liver function, replace electrolytes.  DVT prophylaxis: Lovenox Code Status: Full code Family Communication:  None present at bedside.  Plan of care discussed with patient in length and he verbalized understanding and agreed with it.  I called patient's son & discussed plan of care & he verbalized understanding.  Disposition Plan: SNF  Consultants:   None  Procedures:   CT head  Antimicrobials:   None  Status is: Inpatient  Dispo: The patient is from: Home              Anticipated d/c is to: SNF  Anticipated d/c date is: 3 days              Patient currently is not medically stable to d/c.   Subjective: Patient seen and examined.  Sleepy but arousable and following commands.  Appears more alert this morning as  compared to yesterday.  Oriented to place and time.  Denies any new complaints. Objective: Vitals:   02/07/20 1344 02/07/20 1945 02/08/20 0300 02/08/20 0748  BP: (!) 161/84 (!) 142/61 132/71 130/72  Pulse: 96 98 88 89  Resp: 18 16 16 17   Temp: 98 F (36.7 C) 98.5 F (36.9 C) 98.9 F (37.2 C) 98.8 F (37.1 C)  TempSrc:  Axillary Axillary Axillary  SpO2: 100% 95% 96% 98%    Intake/Output Summary (Last 24 hours) at 02/08/2020 1045 Last data filed at 02/08/2020 0900 Gross per 24 hour  Intake 1885.28 ml  Output 300 ml  Net 1585.28 ml   There were no vitals filed for this visit.  Examination: General exam: Appears calm and comfortable, on room air, appears very dehydrated, sleepy but arousable and communicating well Respiratory system: Clear to auscultation. Respiratory effort normal. Cardiovascular system: S1 & S2 heard, RRR. No JVD, murmurs, rubs, gallops or clicks. No pedal edema. Gastrointestinal system: Abdomen is nondistended, soft and nontender. No organomegaly or masses felt. Normal bowel sounds heard. Central nervous system: Alert and oriented x2. No focal neurological deficits. Extremities: Symmetric 5 x 5 power.  Data Reviewed: I have personally reviewed following labs and imaging studies  CBC: Recent Labs  Lab 01/25/2020 1627 02/07/20 0855 02/08/20 0342  WBC 13.0* 14.8* 12.0*  NEUTROABS 10.8* 11.5*  --   HGB 11.9* 11.6* 11.3*  HCT 36.4 33.9* 34.7*  MCV 84.7 82.1 84.4  PLT 210 189 893   Basic Metabolic Panel: Recent Labs  Lab 02/04/2020 1627 02/07/20 0855 02/07/20 1502 02/08/20 0342  NA 135 132*  --  129*  K 3.9 3.3*  --  3.1*  CL 97* 97*  --  99  CO2 24 18*  --  22  GLUCOSE 93 102*  --  98  BUN 24* 17  --  13  CREATININE 0.81 1.02*  --  0.73  CALCIUM 9.0 8.8*  --  8.4*  MG  --   --  1.6* 2.3  PHOS  --   --  2.5  --    GFR: CrCl cannot be calculated (Unknown ideal weight.). Liver Function Tests: Recent Labs  Lab 02/05/2020 1627 02/07/20 0855  02/08/20 0342  AST 106* 191* 176*  ALT 81* 108* 106*  ALKPHOS 59 59 52  BILITOT 1.0 1.3* 0.9  PROT 6.2* 5.9* 5.8*  ALBUMIN 3.3* 3.1* 3.0*   No results for input(s): LIPASE, AMYLASE in the last 168 hours. Recent Labs  Lab 02/07/20 1502 02/08/20 0342  AMMONIA 44* 20   Coagulation Profile: No results for input(s): INR, PROTIME in the last 168 hours. Cardiac Enzymes: Recent Labs  Lab 01/17/2020 1627 02/07/20 0855 02/08/20 0342  CKTOTAL 1,737* 1,984* 1,530*   BNP (last 3 results) No results for input(s): PROBNP in the last 8760 hours. HbA1C: No results for input(s): HGBA1C in the last 72 hours. CBG: No results for input(s): GLUCAP in the last 168 hours. Lipid Profile: No results for input(s): CHOL, HDL, LDLCALC, TRIG, CHOLHDL, LDLDIRECT in the last 72 hours. Thyroid Function Tests: Recent Labs    02/07/20 1502  TSH 6.677*   Anemia Panel: Recent Labs    02/07/20 1502  VITAMINB12 691  FOLATE 2.2*   Sepsis Labs: Recent Labs  Lab 02/05/2020 1627 02/07/20 0855 02/07/20 1502 02/08/20 0342  PROCALCITON  --   --  0.23 0.17  LATICACIDVEN 2.2* 1.3  --   --     Recent Results (from the past 240 hour(s))  Resp Panel by RT-PCR (Flu A&B, Covid) Nasopharyngeal Swab     Status: None   Collection Time: 02/03/2020  2:23 PM   Specimen: Nasopharyngeal Swab; Nasopharyngeal(NP) swabs in vial transport medium  Result Value Ref Range Status   SARS Coronavirus 2 by RT PCR NEGATIVE NEGATIVE Final    Comment: (NOTE) SARS-CoV-2 target nucleic acids are NOT DETECTED.  The SARS-CoV-2 RNA is generally detectable in upper respiratory specimens during the acute phase of infection. The lowest concentration of SARS-CoV-2 viral copies this assay can detect is 138 copies/mL. A negative result does not preclude SARS-Cov-2 infection and should not be used as the sole basis for treatment or other patient management decisions. A negative result may occur with  improper specimen  collection/handling, submission of specimen other than nasopharyngeal swab, presence of viral mutation(s) within the areas targeted by this assay, and inadequate number of viral copies(<138 copies/mL). A negative result must be combined with clinical observations, patient history, and epidemiological information. The expected result is Negative.  Fact Sheet for Patients:  EntrepreneurPulse.com.au  Fact Sheet for Healthcare Providers:  IncredibleEmployment.be  This test is no t yet approved or cleared by the [place] FDA and  has been authorized for detection and/or diagnosis of SARS-CoV-2 by FDA under an Emergency Use Authorization (EUA). This EUA will remain  in effect (meaning this test can be used) for the duration of the COVID-19 declaration under Section 564(b)(1) of the Act, 21 U.S.C.section 360bbb-3(b)(1), unless the authorization is terminated  or revoked sooner.       Influenza A by PCR NEGATIVE NEGATIVE Final   Influenza B by PCR NEGATIVE NEGATIVE Final    Comment: (NOTE) The Xpert Xpress SARS-CoV-2/FLU/RSV plus assay is intended as an aid in the diagnosis of influenza from Nasopharyngeal swab specimens and should not be used as a sole basis for treatment. Nasal washings and aspirates are unacceptable for Xpert Xpress SARS-CoV-2/FLU/RSV testing.  Fact Sheet for Patients: EntrepreneurPulse.com.au  Fact Sheet for Healthcare Providers: IncredibleEmployment.be  This test is not yet approved or cleared by the [place] FDA and has been authorized for detection and/or diagnosis of SARS-CoV-2 by FDA under an Emergency Use Authorization (EUA). This EUA will remain in effect (meaning this test can be used) for the duration of the COVID-19 declaration under Section 564(b)(1) of the Act, 21 U.S.C. section 360bbb-3(b)(1), unless the authorization is terminated or revoked.  Performed at [facility], [place] [address] [place], [place] [address]   Culture, blood (routine x 2)     Status: None (Preliminary result)   Collection Time: 02/07/20  2:49 PM   Specimen: BLOOD  Result Value Ref Range Status   Specimen Description BLOOD SITE NOT SPECIFIED  Final   Special Requests   Final    BOTTLES DRAWN AEROBIC ONLY Blood Culture adequate volume   Culture   Final    NO GROWTH < 24 HOURS Performed at [facility], [place] [address], [place], [place] [address]    Report Status PENDING  Incomplete  Culture, blood (routine x 2)     Status: None (Preliminary result)   Collection Time: 02/07/20  3:03 PM   Specimen: BLOOD  Result Value Ref  Range Status   Specimen Description BLOOD SITE NOT SPECIFIED  Final   Special Requests   Final    BOTTLES DRAWN AEROBIC AND ANAEROBIC Blood Culture adequate volume   Culture   Final    NO GROWTH < 24 HOURS Performed at [facility], [address] [place], [place] [address]    Report Status PENDING  Incomplete      Radiology Studies: CT Head Wo Contrast  Result Date: 01/13/2020 CLINICAL DATA:  Altered mental status.  Recent fall EXAM: CT HEAD WITHOUT CONTRAST TECHNIQUE: Contiguous axial images were obtained from the base of the skull through the vertex without intravenous contrast. COMPARISON:  December 12, 2017 head CT.  Brain MRI December 25, 2005 FINDINGS: Brain: Stable mild to moderate diffuse atrophy. There is no intracranial mass, hemorrhage, extra-axial fluid collection, or midline shift. There is small vessel disease throughout the centra semiovale bilaterally. Small vessel disease is noted in the anterior limbs of each external capsule and internal capsule. No acute infarct is appreciable. Vascular: No hyperdense vessel. There is calcification in each carotid siphon region. Skull: Bony calvarium appears intact. Sinuses/Orbits: Previous erosion and remodeling of the orbital floor regions with well-defined infraorbital masses, likely  schwannoma is given previous appearance. Lesions are stable compared to 2007. Paranasal sinuses clear. Other: There is opacification in multiple mastoid air cells on the left. There are several mastoid air cells inferiorly opacified on the right. IMPRESSION: 1. 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 No acute infarct. No intra-axial mass or hemorrhage. 2. 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 3.  Foci of arterial vascular calcification noted. 4. Opacification of multiple mastoid air cells, more severe on the left than the right. Electronically Signed   By: Lowella Grip III M.D.   On: 01/28/2020 15:37   DG Pelvis Portable  Result Date: 01/25/2020 CLINICAL DATA:  Unwitnessed fall EXAM: PORTABLE PELVIS 1-2 VIEWS COMPARISON:  08/04/2014 FINDINGS: There is no evidence of pelvic fracture or diastasis. No pelvic bone lesions are seen. IMPRESSION: Negative. Electronically Signed   By: Davina Poke D.O.   On: 01/31/2020 16:11   DG Chest Portable 1 View  Result Date: 01/18/2020 CLINICAL DATA:  Altered mental status. EXAM: PORTABLE CHEST 1 VIEW COMPARISON:  Chest CT Aug 04, 2014 FINDINGS: Cardiomediastinal silhouette is normal. Mediastinal contours appear intact. Calcific atherosclerotic disease and tortuosity of the aorta. There is no evidence of focal airspace consolidation, pleural effusion or pneumothorax. Osseous structures are without acute abnormality. Soft tissues are grossly normal. IMPRESSION: 1. No active disease. 2. Calcific atherosclerotic disease and tortuosity of the aorta. Electronically Signed   By: Fidela Salisbury M.D.   On: 01/28/2020 14:43   US Abdomen Limited RUQ (LIVER/GB)  Result Date: 02/08/2020 CLINICAL DATA:  Elevated liver enzymes.  Found down after fall. EXAM: ULTRASOUND ABDOMEN LIMITED RIGHT UPPER QUADRANT COMPARISON:  None. FINDINGS: Gallbladder: No gallstones or  wall thickening visualized. No sonographic Murphy sign noted by sonographer. Common bile duct: Diameter: 6 mm Liver: No focal lesion identified. Within normal limits in parenchymal echogenicity. Portal vein is patent on color Doppler imaging with normal direction of blood flow towards the liver. Other: None. IMPRESSION: Normal right upper quadrant ultrasound. Electronically Signed   By: Ulyses Jarred M.D.   On: 02/08/2020 01:22    Scheduled Meds: . enoxaparin (LOVENOX) injection  40 mg Subcutaneous Q24H  . propranolol  20 mg Oral TID  .  senna  1 tablet Oral BID   Continuous Infusions: . 0.9 % NaCl with KCl 20 mEq / L 125 mL/hr at 02/07/20 1439     LOS: 2 days   Time spent: 45 minutes   Charvi Gammage Loann Quill, MD [facility]  If 7PM-7AM, please contact night-coverage [email or web address] 02/08/2020, 10:45 AM

## 2020-02-08 NOTE — TOC Progression Note (Signed)
Transition of Care [facility]) - Progression Note    Patient Details  Name: Glenda Stevens MRN: [id number] Date of Birth: 02/26/1946  Transition of Care [facility]) CM/SW [place], [place] Phone Number: 02/08/2020, 4:36 PM  Clinical Narrative:      CSW tried to call patients son to provide SNF bed offers. CSW left voicemail to return call. CSW awaiting callback.  CSW will continue to follow.    Expected Discharge Plan: Skilled Nursing Facility Barriers to Discharge: Continued Medical Work up  Expected Discharge Plan and Services Expected Discharge Plan: [place]                                               Social Determinants of Health (SDOH) Interventions    Readmission Risk Interventions No flowsheet data found.

## 2020-02-08 NOTE — Evaluation (Signed)
Clinical/Bedside Swallow Evaluation Patient Details  Name: Glenda Stevens MRN: [id number] Date of Birth: 12/17/1945  Today's Date: 02/08/2020 Time: SLP Start Time (ACUTE ONLY): 1962 SLP Stop Time (ACUTE ONLY): 0957 SLP Time Calculation (min) (ACUTE ONLY): 19 min  Past Medical History:  Past Medical History:  Diagnosis Date  . Allergy    Hay fever  . Anxiety   . Arthritis   . Cataract   . 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 GERD (gastroesophageal reflux disease)   . History of chicken pox   . Hyperlipidemia   . Hypertension   . Memory loss    forgetfull, sundowning  . Phlebitis and thrombophlebitis   . PONV (postoperative nausea and vomiting)   . Renal artery stenosis ([facility])    left stent in 2003  . Renovascular hypertension   . 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 ABDOMINAL HYSTERECTOMY    . BREAST SURGERY     bilateral lumpectomy-benign  . CATARACT EXTRACTION Bilateral   . COLON RESECTION N/A 08/18/2014   Procedure: LAPAROSCOPIC ASSISTED  COLON POLYP RESECTION , DIAGNOSTIC LAPROSCOPY ;  Surgeon: Leighton Ruff, MD;  Location: [facility] ORS;  Service: General;  Laterality: N/A;  . NASAL SINUS SURGERY     patient denies having had nasal sinus surgery  . NM MYOCAR PERF WALL MOTION  09/2009   bruce myoview - normal pattern of perfusion, EF 83%, low risk scan  . RENAL ARTERY ANGIOPLASTY  07/02/2001   6x45mm Genesis on Aviator balloon stent overlapping a 6x53mm Genesist on Aviator balloon stent (Dr. Marella Chimes)  . RENAL ARTERY STENT    . RENAL DOPPLER  09/2012   left renal artery stent - 60-99% diameter reduction  . TRANSTHORACIC ECHOCARDIOGRAM  06/2012   EF 55-60%, mild MR   HPI:  74 year old female with past medical history of hypertension, GERD, arthritis, progressive memory loss with self-neglect was found by neighbors outside.  Dx acute metabolic/toxic  encephalopathy, hypothermia; rhabodmyolysis.    Assessment / Plan / Recommendation Clinical Impression  Pt was sleepy but rousable and able to participate in swallow assessment.  Voice/cough were strong. Oral mechanism exam was normal.   Oral care provided.  Pt required "warm-up" time to activate her swallowing musculature.  She was then able to slowly but effectively masticate solids; her swallow response appeared to be brisk. There were no s/s of aspiration with any consistencies nor with successive boluses of thin liquids.  Rec resuming a regular diet, thin liquids.  Assist pt with self-feeding until she is stronger.  There is no dysphagia.  SLP service will sign off.  SLP Visit Diagnosis: Dysphagia, unspecified (R13.10)    Aspiration Risk  No limitations    Diet Recommendation   regular solids, thin liquids  Medication Administration: Whole meds with liquid    Other  Recommendations Oral Care Recommendations: Oral care BID   Follow up Recommendations None      Frequency and Duration            Prognosis        Swallow Study   General Date of Onset: 02/07/20 HPI: 74 year old female with past medical history of hypertension, GERD, arthritis, progressive memory loss with self-neglect was found by neighbors outside.  Dx acute metabolic/toxic encephalopathy, hypothermia; rhabodmyolysis.  Type of Study: Bedside Swallow Evaluation Previous Swallow Assessment: no Diet Prior to this Study: NPO Temperature Spikes  Noted: No Respiratory Status: Room air History of Recent Intubation: No Behavior/Cognition: Lethargic/Drowsy Oral Cavity Assessment: Within Functional Limits Oral Care Completed by SLP: Yes Oral Cavity - Dentition: Adequate natural dentition Vision: Functional for self-feeding Self-Feeding Abilities: Total assist Patient Positioning: Upright in bed Baseline Vocal Quality: Normal Volitional Cough: Strong Volitional Swallow: Able to elicit    Oral/Motor/Sensory  Function Overall Oral Motor/Sensory Function: Within functional limits   Ice Chips Ice chips: Within functional limits   Thin Liquid Thin Liquid: Within functional limits    Nectar Thick Nectar Thick Liquid: Not tested   Honey Thick Honey Thick Liquid: Not tested   Puree Puree: Within functional limits   Solid     Solid: Within functional limits      Juan Quam Laurice 02/08/2020,10:00 AM   Estill Bamberg L. Tivis Ringer, [place] Office number [phone] Pager [phone]

## 2020-02-08 NOTE — TOC Progression Note (Addendum)
Transition of Care [facility]) - Progression Note    Patient Details  Name: Glenda Stevens MRN: [id number] Date of Birth: 10/20/1945  Transition of Care [facility]) CM/SW [place], [place] Phone Number: 02/08/2020, 3:17 PM  Clinical Narrative:    CSW called and left message with pt son requesting call back.   1645:CSW called pt Son and provided bed offers and medicare ratings. Son would like time to choose. CSW explained TOC would reach out to him in the AM to discuss  Choice. CSW discussed potential for pt to DC tomorrow.   CSW started [place]. [id number]#[id number]. Clinicals faxed  A facility will need to be added to auth once chosen.    Expected Discharge Plan: Skilled Nursing Facility Barriers to Discharge: Continued Medical Work up  Expected Discharge Plan and Services Expected Discharge Plan: [place]                                               Social Determinants of Health (SDOH) Interventions    Readmission Risk Interventions No flowsheet data found.

## 2020-02-09 DIAGNOSIS — I10 Essential (primary) hypertension: Secondary | ICD-10-CM | POA: Diagnosis not present

## 2020-02-09 DIAGNOSIS — T796XXA Traumatic ischemia of muscle, initial encounter: Secondary | ICD-10-CM | POA: Diagnosis not present

## 2020-02-09 DIAGNOSIS — F419 Anxiety disorder, unspecified: Secondary | ICD-10-CM | POA: Diagnosis not present

## 2020-02-09 DIAGNOSIS — T68XXXA Hypothermia, initial encounter: Secondary | ICD-10-CM | POA: Diagnosis not present

## 2020-02-09 LAB — COMPREHENSIVE METABOLIC PANEL
ALT: 98 U/L — ABNORMAL HIGH (ref 0–44)
AST: 156 U/L — ABNORMAL HIGH (ref 15–41)
Albumin: 3.2 g/dL — ABNORMAL LOW (ref 3.5–5.0)
Alkaline Phosphatase: 70 U/L (ref 38–126)
Anion gap: 12 (ref 5–15)
BUN: 10 mg/dL (ref 8–23)
CO2: 20 mmol/L — ABNORMAL LOW (ref 22–32)
Calcium: 8.9 mg/dL (ref 8.9–10.3)
Chloride: 106 mmol/L (ref 98–111)
Creatinine, Ser: 0.76 mg/dL (ref 0.44–1.00)
GFR, Estimated: >60 mL/min (ref >60)
Glucose, Bld: 106 mg/dL — ABNORMAL HIGH (ref 70–99)
Potassium: 4.1 mmol/L (ref 3.5–5.1)
Sodium: 138 mmol/L (ref 135–145)
Total Bilirubin: 0.8 mg/dL (ref 0.3–1.2)
Total Protein: 6.6 g/dL (ref 6.5–8.1)

## 2020-02-09 LAB — CBC
HCT: 37.6 % (ref 36.0–46.0)
Hemoglobin: 12.8 g/dL (ref 12.0–15.0)
MCH: 28.6 pg (ref 26.0–34.0)
MCHC: 34 g/dL (ref 30.0–36.0)
MCV: 84.1 fL (ref 80.0–100.0)
Platelets: 189 10*3/uL (ref 150–400)
RBC: 4.47 MIL/uL (ref 3.87–5.11)
RDW: 15 % (ref 11.5–15.5)
WBC: 13 10*3/uL — ABNORMAL HIGH (ref 4.0–10.5)
nRBC: 0 % (ref 0.0–0.2)

## 2020-02-09 LAB — PROCALCITONIN: Procalcitonin: <0.1 ng/mL

## 2020-02-09 LAB — CK: Total CK: 791 U/L — ABNORMAL HIGH (ref 38–234)

## 2020-02-09 LAB — OSMOLALITY: Osmolality: 294 mOsm/kg (ref 275–295)

## 2020-02-09 MED ORDER — HYDRALAZINE HCL 20 MG/ML IJ SOLN
10.0000 mg | Freq: Four times a day (QID) | INTRAMUSCULAR | Status: DC | PRN
  Administered 2020-02-10: 10 mg via INTRAVENOUS
  Filled 2020-02-09 (×2): qty 1

## 2020-02-09 MED ORDER — IRBESARTAN 150 MG PO TABS
75.0000 mg | ORAL_TABLET | Freq: Every day | ORAL | Status: DC
  Administered 2020-02-09 – 2020-02-12 (×4): 75 mg via ORAL
  Filled 2020-02-09 (×4): qty 1

## 2020-02-09 NOTE — Progress Notes (Addendum)
PROGRESS NOTE    Glenda Stevens  SEG:[id number] DOB: [birth year] DOA: 01/12/2020 PCP: Lorrene Reid, PA-C   Brief Narrative:  Patient is a 74 year old female with past medical history of hypertension, GERD, arthritis progressive memory loss with self-neglect was found by neighbors outside.  Patient had no idea how long she was here.  EMS was called and put warm blankets around her and brought to the patient for further evaluation and management.  Upon arrival to ED: Patient hypothermic with temperature of 91.5, blood pressure: 99/57, heart rate 92, respiratory rate 22.  Patient notable for rigors, contusion to scalp above left forehead, dry mucous membrane.  AST: 106, ALT: 81, leukocytosis of 13.9, CK: 1737, lactic acid: 2.2.  CT head: Negative for acute findings.  EKG shows sinus rhythm.  Patient admitted for further evaluation and management of rhabdomyolysis and hypothermia.  Assessment & Plan:  Acute metabolicencephalopathy: -Hypotensive, hypothermic in the setting of cold exposure.  CT head negative for acute findings. -On admission: Given IV fluids and placed on Bair hugger.  Blood pressure and temperature improved. -Upon arrival leukocytosis of 13,000, elevated lactic acid.  Chest x-ray negative for acute findings.  COVID-19 negative. -Work-up including UA: Negative, B12, folate, TSH, ethanol: WNL  -Ammonia level elevated at 44 was given lactulose per rectal once.  Ammonia level: Improved.  UDS positive for benzos.  Blood culture negative so far.  -Leukocytosis--> 14.8-12.0-13.0..  She remained afebrile.  LA trended down. -Consult PT/OT-recommended SNF.  Evaluated by SLP recommended regular solids.  Thin liquids.  No limitations for aspiration risk. -On fall/aspiration precautions  Traumatic Rhabdomyolysis: -CK level initially trended up from [phone] then trended down to 1530-791. -Continue IV fluids. -Repeat CK tomorrow am  AKI:  -Likely from rhabdomyolysis.  Resolved with IV  fluids. -Hold nephrotoxic medication.    Elevated liver enzymes: -Improving -Right upper quadrant ultrasound came back within normal limit.  Ammonia level: Trended down after lactulose.  Acute hepatitis panel: Negative.  Continue to hold atorvastatin.   -Repeat CMP tomorrow am  Hyponatremia: Sodium level trended down from 132-129. Resolved -Serum osmolality: 294, urine sodium: 73, urine osmolality: 589  (all within normal limits) -Repeat CMP is pending  Hypokalemia: Replenished.  Resolved  Hypomagnesemia: Replenished.  Hypertension: Blood pressure is elevated. -On irbesartan and propranolol at home- -resume irbesartan.  Continue to monitor blood pressure closely.  Hydralazine as needed for blood pressure more than 160/100.  Hyperlipidemia: Hold statin due to elevated liver enzymes  Progressive dementia: -Supportive care. -Hold off neurology consult at this time.  Tobacco abuse:  -counseled about cessation.  Alcohol use: 2 glass of wine every night -never been a heavy drinker as per son, no illicit drugs-former marijuana use-not anymore.  Ethanol level: WNL.  DVT prophylaxis: Lovenox Code Status: Full code Family Communication: Patient's son present at bedside.  Plan of care discussed with patient in length and they verbalized understanding and agreed with it.  Disposition Plan: SNF  Consultants:   None  Procedures:   CT head  Antimicrobials:   None  Status is: Inpatient  Dispo: The patient is from: Home              Anticipated d/c is to: SNF              Anticipated d/c date is: 02/10/2020              Patient currently is not medically stable to d/c.   Subjective: Patient seen and examined.  Sleepy but arousable and  following commands.  Son at bedside.  Tells me that she feels better and has no new complaints.  Objective: Vitals:   02/08/20 1930 02/09/20 0300 02/09/20 0825 02/09/20 1500  BP: 109/66 111/63 (!) 186/95 (!) 168/84  Pulse: 98 98 90 87    Resp: 20 16  17   Temp: 98.1 F (36.7 C) 98.5 F (36.9 C) 98.4 F (36.9 C) 98.4 F (36.9 C)  TempSrc: Oral Oral Axillary Oral  SpO2: 95% 95% 97% 97%    Intake/Output Summary (Last 24 hours) at 02/09/2020 1527 Last data filed at 02/09/2020 1517 Gross per 24 hour  Intake 2968.44 ml  Output --  Net 2968.44 ml   There were no vitals filed for this visit.  Examination: General exam: Appears calm and comfortable, elderly, on room air, appears mild  dehydrated, sleepy but arousable and communicating well Respiratory system: Clear to auscultation. Respiratory effort normal. Cardiovascular system: S1 & S2 heard, RRR. No JVD, murmurs, rubs, gallops or clicks. No pedal edema. Gastrointestinal system: Abdomen is nondistended, soft and nontender. No organomegaly or masses felt. Normal bowel sounds heard. Central nervous system: Alert and oriented x2. No focal neurological deficits. Extremities: Symmetric 5 x 5 power.  Data Reviewed: I have personally reviewed following labs and imaging studies  CBC: Recent Labs  Lab 01/29/2020 1627 02/07/20 0855 02/08/20 0342 02/09/20 0224  WBC 13.0* 14.8* 12.0* 13.0*  NEUTROABS 10.8* 11.5*  --   --   HGB 11.9* 11.6* 11.3* 12.8  HCT 36.4 33.9* 34.7* 37.6  MCV 84.7 82.1 84.4 84.1  PLT 210 189 168 409   Basic Metabolic Panel: Recent Labs  Lab 01/16/2020 1627 02/07/20 0855 02/07/20 1502 02/08/20 0342  NA 135 132*  --  129*  K 3.9 3.3*  --  3.1*  CL 97* 97*  --  99  CO2 24 18*  --  22  GLUCOSE 93 102*  --  98  BUN 24* 17  --  13  CREATININE 0.81 1.02*  --  0.73  CALCIUM 9.0 8.8*  --  8.4*  MG  --   --  1.6* 2.3  PHOS  --   --  2.5  --    GFR: CrCl cannot be calculated (Unknown ideal weight.). Liver Function Tests: Recent Labs  Lab 01/26/2020 1627 02/07/20 0855 02/08/20 0342  AST 106* 191* 176*  ALT 81* 108* 106*  ALKPHOS 59 59 52  BILITOT 1.0 1.3* 0.9  PROT 6.2* 5.9* 5.8*  ALBUMIN 3.3* 3.1* 3.0*   No results for input(s): LIPASE,  AMYLASE in the last 168 hours. Recent Labs  Lab 02/07/20 1502 02/08/20 0342  AMMONIA 44* 20   Coagulation Profile: No results for input(s): INR, PROTIME in the last 168 hours. Cardiac Enzymes: Recent Labs  Lab 02/01/2020 1627 02/07/20 0855 02/08/20 0342  CKTOTAL 1,737* 1,984* 1,530*   BNP (last 3 results) No results for input(s): PROBNP in the last 8760 hours. HbA1C: No results for input(s): HGBA1C in the last 72 hours. CBG: No results for input(s): GLUCAP in the last 168 hours. Lipid Profile: No results for input(s): CHOL, HDL, LDLCALC, TRIG, CHOLHDL, LDLDIRECT in the last 72 hours. Thyroid Function Tests: Recent Labs    02/07/20 1502  TSH 6.677*   Anemia Panel: Recent Labs    02/07/20 1502  VITAMINB12 691  FOLATE 2.2*   Sepsis Labs: Recent Labs  Lab 01/19/2020 1627 02/07/20 0855 02/07/20 1502 02/08/20 0342 02/09/20 0614  PROCALCITON  --   --  0.23 0.17 <0.10  LATICACIDVEN 2.2* 1.3  --   --   --     Recent Results (from the past 240 hour(s))  Resp Panel by RT-PCR (Flu A&B, Covid) Nasopharyngeal Swab     Status: None   Collection Time: 01/22/2020  2:23 PM   Specimen: Nasopharyngeal Swab; Nasopharyngeal(NP) swabs in vial transport medium  Result Value Ref Range Status   SARS Coronavirus 2 by RT PCR NEGATIVE NEGATIVE Final    Comment: (NOTE) SARS-CoV-2 target nucleic acids are NOT DETECTED.  The SARS-CoV-2 RNA is generally detectable in upper respiratory specimens during the acute phase of infection. The lowest concentration of SARS-CoV-2 viral copies this assay can detect is 138 copies/mL. A negative result does not preclude SARS-Cov-2 infection and should not be used as the sole basis for treatment or other patient management decisions. A negative result may occur with  improper specimen collection/handling, submission of specimen other than nasopharyngeal swab, presence of viral mutation(s) within the areas targeted by this assay, and inadequate number of  viral copies(<138 copies/mL). A negative result must be combined with clinical observations, patient history, and epidemiological information. The expected result is Negative.  Fact Sheet for Patients:  EntrepreneurPulse.com.au  Fact Sheet for Healthcare Providers:  IncredibleEmployment.be  This test is no t yet approved or cleared by the [place] FDA and  has been authorized for detection and/or diagnosis of SARS-CoV-2 by FDA under an Emergency Use Authorization (EUA). This EUA will remain  in effect (meaning this test can be used) for the duration of the COVID-19 declaration under Section 564(b)(1) of the Act, 21 U.S.C.section 360bbb-3(b)(1), unless the authorization is terminated  or revoked sooner.       Influenza A by PCR NEGATIVE NEGATIVE Final   Influenza B by PCR NEGATIVE NEGATIVE Final    Comment: (NOTE) The Xpert Xpress SARS-CoV-2/FLU/RSV plus assay is intended as an aid in the diagnosis of influenza from Nasopharyngeal swab specimens and should not be used as a sole basis for treatment. Nasal washings and aspirates are unacceptable for Xpert Xpress SARS-CoV-2/FLU/RSV testing.  Fact Sheet for Patients: EntrepreneurPulse.com.au  Fact Sheet for Healthcare Providers: IncredibleEmployment.be  This test is not yet approved or cleared by the [place] FDA and has been authorized for detection and/or diagnosis of SARS-CoV-2 by FDA under an Emergency Use Authorization (EUA). This EUA will remain in effect (meaning this test can be used) for the duration of the COVID-19 declaration under Section 564(b)(1) of the Act, 21 U.S.C. section 360bbb-3(b)(1), unless the authorization is terminated or revoked.  Performed at [facility], [place] [address] [place], [place] [address]   Culture, blood (routine x 2)     Status: None (Preliminary result)   Collection Time: 02/07/20  2:49 PM    Specimen: BLOOD  Result Value Ref Range Status   Specimen Description BLOOD SITE NOT SPECIFIED  Final   Special Requests   Final    BOTTLES DRAWN AEROBIC ONLY Blood Culture adequate volume   Culture   Final    NO GROWTH 2 DAYS Performed at [facility], [address] [place], [place] [address]    Report Status PENDING  Incomplete  Culture, blood (routine x 2)     Status: None (Preliminary result)   Collection Time: 02/07/20  3:03 PM   Specimen: BLOOD  Result Value Ref Range Status   Specimen Description BLOOD SITE NOT SPECIFIED  Final   Special Requests   Final    BOTTLES DRAWN AEROBIC AND ANAEROBIC  Blood Culture adequate volume   Culture   Final    NO GROWTH 2 DAYS Performed at [facility], [place] [address], [place], [place] [address]    Report Status PENDING  Incomplete      Radiology Studies: US Abdomen Limited RUQ (LIVER/GB)  Result Date: 02/08/2020 CLINICAL DATA:  Elevated liver enzymes.  Found down after fall. EXAM: ULTRASOUND ABDOMEN LIMITED RIGHT UPPER QUADRANT COMPARISON:  None. FINDINGS: Gallbladder: No gallstones or wall thickening visualized. No sonographic Murphy sign noted by sonographer. Common bile duct: Diameter: 6 mm Liver: No focal lesion identified. Within normal limits in parenchymal echogenicity. Portal vein is patent on color Doppler imaging with normal direction of blood flow towards the liver. Other: None. IMPRESSION: Normal right upper quadrant ultrasound. Electronically Signed   By: Ulyses Jarred M.D.   On: 02/08/2020 01:22    Scheduled Meds: . enoxaparin (LOVENOX) injection  40 mg Subcutaneous Q24H  . propranolol  20 mg Oral TID  . senna  1 tablet Oral BID   Continuous Infusions: . 0.9 % NaCl with KCl 40 mEq / L 125 mL/hr at 02/08/20 2302     LOS: 3 days   Time spent: 45 minutes   Maninder Deboer Loann Quill, MD [facility]  If 7PM-7AM, please contact night-coverage [email or web address] 02/09/2020, 3:27 PM

## 2020-02-09 NOTE — Plan of Care (Signed)
  Problem: Nutrition: Goal: Adequate nutrition will be maintained Outcome: Progressing   Problem: Safety: Goal: Ability to remain free from injury will improve Outcome: Progressing   Problem: Skin Integrity: Goal: Risk for impaired skin integrity will decrease Outcome: Not Progressing

## 2020-02-09 DEATH — deceased

## 2020-02-10 ENCOUNTER — Encounter (HOSPITAL_COMMUNITY): Payer: Self-pay | Admitting: Internal Medicine

## 2020-02-10 ENCOUNTER — Inpatient Hospital Stay (HOSPITAL_COMMUNITY): Payer: Medicare Other

## 2020-02-10 DIAGNOSIS — T68XXXD Hypothermia, subsequent encounter: Secondary | ICD-10-CM

## 2020-02-10 DIAGNOSIS — T796XXD Traumatic ischemia of muscle, subsequent encounter: Secondary | ICD-10-CM

## 2020-02-10 DIAGNOSIS — J810 Acute pulmonary edema: Secondary | ICD-10-CM | POA: Diagnosis not present

## 2020-02-10 DIAGNOSIS — J9601 Acute respiratory failure with hypoxia: Secondary | ICD-10-CM | POA: Diagnosis not present

## 2020-02-10 DIAGNOSIS — F419 Anxiety disorder, unspecified: Secondary | ICD-10-CM | POA: Diagnosis not present

## 2020-02-10 DIAGNOSIS — I10 Essential (primary) hypertension: Secondary | ICD-10-CM | POA: Diagnosis not present

## 2020-02-10 LAB — COMPREHENSIVE METABOLIC PANEL
ALT: 69 U/L — ABNORMAL HIGH (ref 0–44)
AST: 97 U/L — ABNORMAL HIGH (ref 15–41)
Albumin: 3 g/dL — ABNORMAL LOW (ref 3.5–5.0)
Alkaline Phosphatase: 55 U/L (ref 38–126)
Anion gap: 11 (ref 5–15)
BUN: 15 mg/dL (ref 8–23)
CO2: 18 mmol/L — ABNORMAL LOW (ref 22–32)
Calcium: 9.1 mg/dL (ref 8.9–10.3)
Chloride: 112 mmol/L — ABNORMAL HIGH (ref 98–111)
Creatinine, Ser: 0.83 mg/dL (ref 0.44–1.00)
GFR, Estimated: >60 mL/min (ref >60)
Glucose, Bld: 85 mg/dL (ref 70–99)
Potassium: 4.3 mmol/L (ref 3.5–5.1)
Sodium: 141 mmol/L (ref 135–145)
Total Bilirubin: 0.9 mg/dL (ref 0.3–1.2)
Total Protein: 5.9 g/dL — ABNORMAL LOW (ref 6.5–8.1)

## 2020-02-10 LAB — CBC
HCT: 32.5 % — ABNORMAL LOW (ref 36.0–46.0)
Hemoglobin: 10.8 g/dL — ABNORMAL LOW (ref 12.0–15.0)
MCH: 28.6 pg (ref 26.0–34.0)
MCHC: 33.2 g/dL (ref 30.0–36.0)
MCV: 86 fL (ref 80.0–100.0)
Platelets: 176 10*3/uL (ref 150–400)
RBC: 3.78 MIL/uL — ABNORMAL LOW (ref 3.87–5.11)
RDW: 15.5 % (ref 11.5–15.5)
WBC: 8.9 10*3/uL (ref 4.0–10.5)
nRBC: 0 % (ref 0.0–0.2)

## 2020-02-10 LAB — BLOOD GAS, ARTERIAL
Acid-base deficit: 7 mmol/L — ABNORMAL HIGH (ref 0.0–2.0)
Bicarbonate: 16.9 mmol/L — ABNORMAL LOW (ref 20.0–28.0)
Drawn by: 275531
FIO2: 100
O2 Saturation: 99.2 %
Patient temperature: 37
pCO2 arterial: 27.7 mmHg — ABNORMAL LOW (ref 32.0–48.0)
pH, Arterial: 7.402 (ref 7.350–7.450)
pO2, Arterial: 227 mmHg — ABNORMAL HIGH (ref 83.0–108.0)

## 2020-02-10 LAB — TROPONIN I (HIGH SENSITIVITY)
Troponin I (High Sensitivity): 111 ng/L (ref <18)
Troponin I (High Sensitivity): 109 ng/L (ref <18)

## 2020-02-10 LAB — MAGNESIUM: Magnesium: 1.8 mg/dL (ref 1.7–2.4)

## 2020-02-10 LAB — CK: Total CK: 460 U/L — ABNORMAL HIGH (ref 38–234)

## 2020-02-10 MED ORDER — NITROGLYCERIN 2 % TD OINT
1.0000 [in_us] | TOPICAL_OINTMENT | Freq: Four times a day (QID) | TRANSDERMAL | Status: DC
  Administered 2020-02-10 – 2020-02-12 (×8): 1 [in_us] via TOPICAL
  Filled 2020-02-10: qty 30

## 2020-02-10 MED ORDER — FUROSEMIDE 10 MG/ML IJ SOLN
INTRAMUSCULAR | Status: AC
  Administered 2020-02-10: 40 mg via INTRAVENOUS
  Filled 2020-02-10: qty 4

## 2020-02-10 MED ORDER — IOHEXOL 350 MG/ML SOLN
75.0000 mL | Freq: Once | INTRAVENOUS | Status: AC | PRN
  Administered 2020-02-10: 60 mL via INTRAVENOUS

## 2020-02-10 MED ORDER — FUROSEMIDE 10 MG/ML IJ SOLN
40.0000 mg | Freq: Two times a day (BID) | INTRAMUSCULAR | Status: DC
  Administered 2020-02-10 – 2020-02-12 (×5): 40 mg via INTRAVENOUS
  Filled 2020-02-10 (×6): qty 4

## 2020-02-10 MED ORDER — FUROSEMIDE 10 MG/ML IJ SOLN: 40.0000 mg | Freq: Once | INTRAMUSCULAR | Status: AC

## 2020-02-10 NOTE — Plan of Care (Signed)
Patient is alert and oriented to self only. She is able to feed self with set up assist. Patient is a high fall risk and requires bad alarm and requires frequent rounds. In no acute distress. Iv fluids infusing. Problem: Education: Goal: Knowledge of General Education information will improve Description: Including pain rating scale, medication(s)/side effects and non-pharmacologic comfort measures Outcome: Progressing   Problem: Health Behavior/Discharge Planning: Goal: Ability to manage health-related needs will improve Outcome: Progressing   Problem: Clinical Measurements: Goal: Ability to maintain clinical measurements within normal limits will improve Outcome: Progressing Goal: Will remain free from infection Outcome: Progressing Goal: Diagnostic test results will improve Outcome: Progressing Goal: Respiratory complications will improve Outcome: Progressing Goal: Cardiovascular complication will be avoided Outcome: Progressing   Problem: Activity: Goal: Risk for activity intolerance will decrease Outcome: Progressing   Problem: Nutrition: Goal: Adequate nutrition will be maintained Outcome: Progressing   Problem: Coping: Goal: Level of anxiety will decrease Outcome: Progressing   Problem: Elimination: Goal: Will not experience complications related to bowel motility Outcome: Progressing Goal: Will not experience complications related to urinary retention Outcome: Progressing   Problem: Pain Managment: Goal: General experience of comfort will improve Outcome: Progressing   Problem: Safety: Goal: Ability to remain free from injury will improve Outcome: Progressing   Problem: Skin Integrity: Goal: Risk for impaired skin integrity will decrease Outcome: Progressing

## 2020-02-10 NOTE — Progress Notes (Signed)
Patient transferred to [facility] via bed by RN, NT, Rapid Response RN, and RT.  Verbal report given to Albertina Senegal, RN.

## 2020-02-10 NOTE — Progress Notes (Signed)
   02/10/20 1120  Assess: MEWS Score  BP (!) 192/114  Pulse Rate (!) 102  Resp (!) 32  SpO2 92 %  O2 Device Non-rebreather Mask  Assess: MEWS Score  MEWS Temp 0  MEWS Systolic 0  MEWS Pulse 1  MEWS RR 2  MEWS LOC 0  MEWS Score 3  MEWS Score Color Yellow  Assess: if the MEWS score is Yellow or Red  Were vital signs taken at a resting state? Yes  Focused Assessment Change from prior assessment (see assessment flowsheet)  Early Detection of Sepsis Score *See Row Information* Low  MEWS guidelines implemented *See Row Information* Yes  Treat  MEWS Interventions Consulted Respiratory Therapy;Escalated (See documentation below)  Pain Scale PAINAD  Pain Score 0  Take Vital Signs  Increase Vital Sign Frequency  Yellow: Q 2hr X 2 then Q 4hr X 2, if remains yellow, continue Q 4hrs  Escalate  MEWS: Escalate Yellow: discuss with charge nurse/RN and consider discussing with provider and RRT  Notify: Charge Nurse/RN  Name of Charge Nurse/RN Notified susan  Date Charge Nurse/RN Notified 02/10/20  Time Charge Nurse/RN Notified 1120  Notify: Rapid Response  Name of Rapid Response RN Notified hela  Date Rapid Response Notified 02/10/20  Time Rapid Response Notified 1062  Document  Patient Outcome Not stable and remains on department  Progress note created (see row info) Yes    Rapid response called for pt increased WOB, 02 in the mid 80s on 4LNC. Pt resting at time of assessment. Rapid response RN called as well as respiratory therapy. Charge nurse notified.

## 2020-02-10 NOTE — Progress Notes (Signed)
PT Cancellation Note  Patient Details Name: Glenda Stevens MRN: [id number] DOB: 09-Nov-1945   Cancelled Treatment:    Reason Eval/Treat Not Completed: (P) Patient at procedure or test/unavailable (Medical issues which prohibited therapy. Pt is for stat CT scan due to concerning O2 readings this morning per nursing staff. Plan to reattempt, at a later time/date.)   Quirino Kakos Eli Hose 02/10/2020, 10:01 AM  Erasmo Leventhal , PTA Acute Rehabilitation Services Pager [phone] Office [phone]

## 2020-02-10 NOTE — Progress Notes (Signed)
   02/10/20 1149  Assess: MEWS Score  Pulse Rate (!) 102  Resp (!) 36  SpO2 94 %  Assess: MEWS Score  MEWS Temp 0  MEWS Systolic 0  MEWS Pulse 1  MEWS RR 3  MEWS LOC 0  MEWS Score 4  MEWS Score Color Red  Assess: if the MEWS score is Yellow or Red  Were vital signs taken at a resting state? Yes  Focused Assessment Change from prior assessment (see assessment flowsheet)  Early Detection of Sepsis Score *See Row Information* Low  MEWS guidelines implemented *See Row Information* Yes  Treat  MEWS Interventions Consulted Respiratory Therapy;Escalated (See documentation below);Other (Comment)  Pain Scale PAINAD  Pain Score 0  Take Vital Signs  Increase Vital Sign Frequency  Red: Q 1hr X 4 then Q 4hr X 4, if remains red, continue Q 4hrs  Escalate  MEWS: Escalate Red: discuss with charge nurse/RN and provider, consider discussing with RRT  Notify: Charge Nurse/RN  Name of Charge Nurse/RN Notified susan  Date Charge Nurse/RN Notified 02/10/20  Time Charge Nurse/RN Notified 1149  Notify: Provider  Provider Name/Title swayze  Date Provider Notified 02/10/20  Time Provider Notified 1149  Notification Type Face-to-face  Notification Reason Change in status  Response See new orders  Date of Provider Response 02/10/20  Time of Provider Response 1149  Notify: Rapid Response  Name of Rapid Response RN Notified hela  Date Rapid Response Notified 02/10/20  Time Rapid Response Notified 8177  Document  Patient Outcome Transferred/level of care increased  Progress note created (see row info) Yes   Pt previous registered as yellow mews, now red. Rapid RN and RRT still in room, pt provider, Dr.Swayze at bedside. charge nurse notified as well. Order to be transferred to progressive unit. Pt placed on BiPAP, RRT/RRN in room monitoring Pt.

## 2020-02-10 NOTE — Progress Notes (Addendum)
PROGRESS NOTE  Glenda Stevens TIW:[id number] DOB: 12-01-1945 DOA: 01/20/2020 PCP: Lorrene Reid, PA-C  Brief History   Patient is a 74 year old female with past medical history of hypertension, GERD, arthritis progressive memory loss with self-neglect was found by neighbors outside.  Patient had no idea how long she was here.  EMS was called and put warm blankets around her and brought to the patient for further evaluation and management.  Upon arrival to ED: Patient hypothermic with temperature of 91.5, blood pressure: 99/57, heart rate 92, respiratory rate 22.  Patient notable for rigors, contusion to scalp above left forehead, dry mucous membrane.  AST: 106, ALT: 81, leukocytosis of 13.9, CK: 1737, lactic acid: 2.2.  CT head: Negative for acute findings.  EKG shows sinus rhythm.  Patient admitted for further evaluation and management of rhabdomyolysis and hypothermia.  The patient was saturating in the mid nineties on room air until 0800 this morning when suddenly her oxygen requirements increased to 4L. By 11:00 the patient was in respiratory distress and requiring BIPAP to keep her saturations greater than 90%. CTA chest was negative for PE, but did indicate the presence of pulmonary edema. The patient was given 40 mg IV lasix. She was transferred to progressive care. EKG was checked and it demonstrated only sinus tachycardia. Troponin is elevated at 111. Will continue to follow it. Iv fluids were stopped. Renal ultrasound demonstrated mild hydronephrosis bilaterally. Echocardiogram is pending.  Consultants  . None  Procedures  . None  Antibiotics   Anti-infectives (From admission, onward)   None    .  Subjective  The patient is anxious. She is more comfortably on BIPAP.   Objective   Vitals:  Vitals:   02/10/20 1200 02/10/20 1254  BP: (!) 162/132 (!) 166/89  Pulse: (!) 105 96  Resp: (!) 36 (!) 32  Temp:  99.6 F (37.6 C)  SpO2: 92% 100%   Exam:  Constitutional:  . The  patient is awake and confused.  Respiratory:  . Positive for increased work of breathing. . Diminished breath sounds bilaterally. Marland Kitchen Positive for rales bilaterally. . No tactile fremitus Cardiovascular:  . Regular rate and rhythm . No murmurs, ectopy, or gallups. . No lateral PMI. No thrills. Abdomen:  . Abdomen is soft, non-tender, non-distended . No hernias, masses, or organomegaly . Normoactive bowel sounds.  Musculoskeletal:  . No cyanosis, clubbing, or edema Skin:  . No rashes, lesions, ulcers . palpation of skin: no induration or nodules Neurologic:  . CN 2-12 intact . Sensation all 4 extremities intact Psychiatric:  . Mental status o Mood, affect appropriate o Orientation to person, place, time  . judgment and insight appear intact  I have personally reviewed the following:   Today's Data  . Vitals, CMP, CBC  Micro Data  . Blood cultures x 2: No growth  Imaging  . CTA chest: No PE, positive for pulmonary edema  Cardiology Data  . EKG . Echocardiogram: pending  Scheduled Meds: . enoxaparin (LOVENOX) injection  40 mg Subcutaneous Q24H  . irbesartan  75 mg Oral Daily  . propranolol  20 mg Oral TID  . senna  1 tablet Oral BID   Continuous Infusions:  Active Problems:   Essential hypertension   Anxiety   Poor memory   Rhabdomyolysis   Hypothermia   LOS: 4 days   A & P  Acute hypoxic respiratory failure: The patient is currently requiring 100% NRP to maintain saturations in the low 90's. Continue diuresis. Echocardiogram is pending.  CTA chest demonstrated pulmonary edema. Lasix 40mg  IV given.  Stop IV fluids. Add 1 " Nitropaste to anterior chest wall.   Acute metabolicencephalopathy: Initially due to hypotension, hypothermia in the setting of cold exposure.  CT head negative for acute findings. Upon arrival she had leukocytosis of 13,000, elevated lactic acid.  Chest x-ray negative for acute findings.  COVID-19 negative. Work-up including UA: Negative,  B12, folate, TSH, ethanol: WNL. Ammonia level elevated at 44 was given lactulose per rectal once.  Ammonia level: Improved.  UDS positive for benzos.  Blood culture negative so far. Leukocytosis and lactic acid have trended down. Again very confused today largely due to respiratory distress. Monitor and avoid delirium promoting medications.  On fall/aspiration precautions  Traumatic Rhabdomyolysis: Much improved CK down to 460 this am. IV fluids discontinued.  AKI:  Resolved. Creatinine 0.83. Monitor.  Elevated liver enzymes: Resolved. Likely elevated due to rhabdomyolysis.   Hyponatremia: Resolved. Monitor while diuresing.  Hypokalemia: Replenished.  Resolved  Hypomagnesemia: Replenished.  Hypertension: Blood pressure is elevated. On irbesartan and propranolol at home. Resume irbesartan.  Continue to monitor blood pressure closely.  Hydralazine as needed for blood pressure more than 160/100.  Hyperlipidemia: Hold statin due to elevated liver enzymes  Progressive dementia: Supportive care. Hold off neurology consult at this time.  Tobacco abuse: counseled about cessation.  Alcohol use: 2 glass of wine every night. She has never been a heavy drinker as per son, no illicit drugs-former marijuana use-not anymore.  Ethanol level: WNL.  I have seen and examined this patient myself. I have spent 42 minutes in her evaluation and care.  DVT prophylaxis: Lovenox Code Status: Full code Family Communication: Patient's son present at bedside.  Plan of care discussed with patient in length and they verbalized understanding and agreed with it. Disposition Plan: SNF  Status is: Inpatient  Dispo: The patient is from: Home  Anticipated d/c is to: SNF  Anticipated d/c date is: 02/10/2020  Patient currently is not medically stable to d/c.   Melonie Germani, DO [facility] Direct contact: see [email or web address]  7PM-7AM contact night coverage as  above 02/10/2020, 4:06 PM  LOS: 4 days   ADDENDUM: I have attempted twice to reach the patient's son on the phone to talk to him about today's events. On each occasion it goes straight to voicemail, and the name given is not that of the individual listed as her son in the computer.

## 2020-02-10 NOTE — Progress Notes (Signed)
Dr. Benny Lennert called by Terrence Dupont, RN to report increased work of breathing, respiratory rate, oxygen desaturation, and that patient was placed on 4 Liters oxygen via nasal cannula.  Oxygen saturation responded into 90s and patient now appears comfortable and resting.  Order received for stat CT of chest.  Patient has been sent to CT via bed.

## 2020-02-10 NOTE — Progress Notes (Addendum)
OT Cancellation Note  Patient Details Name: Glenda Stevens MRN: [id number] DOB: [birth year]   Cancelled Treatment:    Reason Eval/Treat Not Completed: Medical issues which prohibited therapy. Pt is for stat CT scan due to concerning O2 readings this morning per nursing staff. Plan to reattempt, at a later time/date.  Tyrone Schimke, OT Acute Rehabilitation Services Pager: [phone] Office: [phone]  02/10/2020, 9:35 AM

## 2020-02-10 NOTE — Significant Event (Signed)
Rapid Response Event Note   Reason for Call :  Acute respiratory distress  Initial Focused Assessment:  She is very restless and agitated in significant respiratory distress.  She is alert but confused.   Lung sounds with crackles up to collar bone. Increased work of breathing Heart tones regular Leg mottled  Interventions:  Placed on NRB O2 sat 92% no improvement in respiratory distress Placed on Bipap 10/10 80% Fio2 40mg  Lasix IV  Plan of Care:  Transfer to PCU Patient voided large amount of unmeasurable UOP Able to lay flat comfortably Tolerating Bipap well   Event Summary:   MD Notified: Dr Benny Lennert  Call Time: [place] Time: 1100 End Time: [place]  Raliegh Ip, RN

## 2020-02-10 NOTE — Progress Notes (Signed)
RT called by Rapid response for BiPAP due to decline in status. When I arrived, patient on NRB and SAT 84%. RN at bedside and MD aware

## 2020-02-11 ENCOUNTER — Inpatient Hospital Stay (HOSPITAL_COMMUNITY): Payer: Medicare Other

## 2020-02-11 DIAGNOSIS — J9601 Acute respiratory failure with hypoxia: Secondary | ICD-10-CM | POA: Diagnosis not present

## 2020-02-11 DIAGNOSIS — F419 Anxiety disorder, unspecified: Secondary | ICD-10-CM | POA: Diagnosis not present

## 2020-02-11 DIAGNOSIS — I10 Essential (primary) hypertension: Secondary | ICD-10-CM | POA: Diagnosis not present

## 2020-02-11 DIAGNOSIS — J810 Acute pulmonary edema: Secondary | ICD-10-CM | POA: Diagnosis not present

## 2020-02-11 LAB — CBC WITH DIFFERENTIAL/PLATELET
Abs Immature Granulocytes: 0.61 10*3/uL — ABNORMAL HIGH (ref 0.00–0.07)
Basophils Absolute: 0 10*3/uL (ref 0.0–0.1)
Basophils Relative: 0 %
Eosinophils Absolute: 0 10*3/uL (ref 0.0–0.5)
Eosinophils Relative: 0 %
HCT: 40.3 % (ref 36.0–46.0)
Hemoglobin: 13.3 g/dL (ref 12.0–15.0)
Immature Granulocytes: 4 %
Lymphocytes Relative: 4 %
Lymphs Abs: 0.6 10*3/uL — ABNORMAL LOW (ref 0.7–4.0)
MCH: 27.9 pg (ref 26.0–34.0)
MCHC: 33 g/dL (ref 30.0–36.0)
MCV: 84.7 fL (ref 80.0–100.0)
Monocytes Absolute: 2 10*3/uL — ABNORMAL HIGH (ref 0.1–1.0)
Monocytes Relative: 13 %
Neutro Abs: 12.3 10*3/uL — ABNORMAL HIGH (ref 1.7–7.7)
Neutrophils Relative %: 79 %
Platelets: 168 10*3/uL (ref 150–400)
RBC: 4.76 MIL/uL (ref 3.87–5.11)
RDW: 15.2 % (ref 11.5–15.5)
WBC: 15.5 10*3/uL — ABNORMAL HIGH (ref 4.0–10.5)
nRBC: 0 % (ref 0.0–0.2)

## 2020-02-11 LAB — BASIC METABOLIC PANEL
Anion gap: 17 — ABNORMAL HIGH (ref 5–15)
BUN: 26 mg/dL — ABNORMAL HIGH (ref 8–23)
CO2: 19 mmol/L — ABNORMAL LOW (ref 22–32)
Calcium: 9.7 mg/dL (ref 8.9–10.3)
Chloride: 108 mmol/L (ref 98–111)
Creatinine, Ser: 1.26 mg/dL — ABNORMAL HIGH (ref 0.44–1.00)
GFR, Estimated: 44 mL/min — ABNORMAL LOW (ref >60)
Glucose, Bld: 104 mg/dL — ABNORMAL HIGH (ref 70–99)
Potassium: 3.5 mmol/L (ref 3.5–5.1)
Sodium: 144 mmol/L (ref 135–145)

## 2020-02-11 LAB — TROPONIN I (HIGH SENSITIVITY)
Troponin I (High Sensitivity): 100 ng/L (ref <18)
Troponin I (High Sensitivity): 165 ng/L (ref <18)

## 2020-02-11 LAB — MAGNESIUM: Magnesium: 1.8 mg/dL (ref 1.7–2.4)

## 2020-02-11 MED ORDER — POTASSIUM CHLORIDE CRYS ER 20 MEQ PO TBCR
40.0000 meq | EXTENDED_RELEASE_TABLET | Freq: Once | ORAL | Status: AC
  Administered 2020-02-11: 40 meq via ORAL
  Filled 2020-02-11: qty 2

## 2020-02-11 NOTE — Progress Notes (Signed)
PROGRESS NOTE  Glenda Stevens [id number]:[id number] DOB: Jul 23, 1945 DOA: 01/26/2020 PCP: Lorrene Reid, PA-C  Brief History   Patient is a 74 year old female with past medical history of hypertension, GERD, arthritis progressive memory loss with self-neglect was found by neighbors outside.  Patient had no idea how long she was here.  EMS was called and put warm blankets around her and brought to the patient for further evaluation and management.  Upon arrival to ED: Patient hypothermic with temperature of 91.5, blood pressure: 99/57, heart rate 92, respiratory rate 22.  Patient notable for rigors, contusion to scalp above left forehead, dry mucous membrane.  AST: 106, ALT: 81, leukocytosis of 13.9, CK: 1737, lactic acid: 2.2.  CT head: Negative for acute findings.  EKG shows sinus rhythm.  Patient admitted for further evaluation and management of rhabdomyolysis and hypothermia.   On 02/10/2020 the patient had an acute change in status with increase in oxygen requirements from room air at 0400 to 100%NRB at 11:00. It seems that this was due to volume overload. IV fluids were stopped, and the patient is receiving diuresis with improvement in her oxygen requirements. CXR this am continues to demonstrate pulmonary edema particularly on the left. Unfortunately recording of I's and O's is lacking, so output cannot be effectively tracked. Continue to diurese and monitor creatinine, electrolytes and volume status to the best of my ability. Echocardiogram is still pending.  Consultants  . None  Procedures  . None  Antibiotics   Anti-infectives (From admission, onward)   None     Subjective  The patient is resting comfortably on BIPAP. No new complaints. FiO2 is 50%.  Objective   Vitals:  Vitals:   02/11/20 0751 02/11/20 1246  BP:  (!) 140/95  Pulse:  84  Resp: 20   Temp:  98.2 F (36.8 C)  SpO2:  91%   Exam:  Constitutional:  . The patient is resting comfortably. No acute  distress. Respiratory:  . No increased work of breathing. Marland Kitchen Positive for wheezes throughout and scattered rales.  . No rhonchi. . No tactile fremitus Cardiovascular:  . Regular rate and rhythm . No murmurs, ectopy, or gallups. . No lateral PMI. No thrills. Abdomen:  . Abdomen is soft, non-tender, non-distended . No hernias, masses, or organomegaly . Normoactive bowel sounds.  Musculoskeletal:  . No cyanosis, clubbing, or edema Skin:  . No rashes, lesions, ulcers . palpation of skin: no induration or nodules Neurologic:  . CN 2-12 intact . Sensation all 4 extremities intact Psychiatric:  . Mental status o Mood, affect appropriate o Orientation to person, place, time  . judgment and insight appear intact  I have personally reviewed the following:   Today's Data  . Vitals, BMP, CBC  Micro Data  . Blood cultures x 2: No growth  Imaging  . CTA chest: No PE, positive for pulmonary edema . CXR: Pulmonary edema  Cardiology Data  . EKG . Echocardiogram: pending  Scheduled Meds: . enoxaparin (LOVENOX) injection  40 mg Subcutaneous Q24H  . furosemide  40 mg Intravenous BID  . irbesartan  75 mg Oral Daily  . nitroGLYCERIN  1 inch Topical Q6H  . potassium chloride  40 mEq Oral Once  . propranolol  20 mg Oral TID  . senna  1 tablet Oral BID   Continuous Infusions:  Active Problems:   Essential hypertension   Anxiety   Poor memory   Rhabdomyolysis   Hypothermia   LOS: 5 days   A & P  Acute hypoxic respiratory failure: The patient is currently requiring 100% NRP to maintain saturations in the low 90's. Continue diuresis. Echocardiogram is pending. CTA chest demonstrated pulmonary edema. Lasix 40mg  IV given.  Stop IV fluids. Add 1 " Nitropaste to anterior chest wall.   Acute metabolicencephalopathy: Initially due to hypotension, hypothermia in the setting of cold exposure.  CT head negative for acute findings. Upon arrival she had leukocytosis of 13,000, elevated  lactic acid.  Chest x-ray negative for acute findings.  COVID-19 negative. Work-up including UA: Negative, B12, folate, TSH, ethanol: WNL. Ammonia level elevated at 44 was given lactulose per rectal once.  Ammonia level: Improved.  UDS positive for benzos.  Blood culture negative so far. Leukocytosis and lactic acid have trended down. Pt is improved on BIPAP currently. Monitor.  Traumatic Rhabdomyolysis: Much improved CK down to 460 on 02/10/2020. IV fluids discontinued.  AKI:  Resolved. Creatinine 1.26. Monitor.  Elevated liver enzymes: Resolved. Likely elevated due to rhabdomyolysis.   Hyponatremia: Resolved. Monitor while diuresing.  Hypokalemia: Monitor and supplement as necessary.  Hypomagnesemia: Replenished.  Hypertension: Blood pressure is elevated. On irbesartan and propranolol at home. Resume irbesartan.  Continue to monitor blood pressure closely.  Hydralazine as needed for blood pressure more than 160/100.  Hyperlipidemia: Hold statin due to elevated liver enzymes  Progressive dementia: Supportive care. Hold off neurology consult at this time.  Tobacco abuse: counseled about cessation.  Alcohol use: 2 glass of wine every night. She has never been a heavy drinker as per son, no illicit drugs-former marijuana use-not anymore.  Ethanol level: WNL.  I have seen and examined this patient myself. I have spent 34 minutes in her evaluation and care.  DVT prophylaxis: Lovenox Code Status: Full code Family Communication: Patient's son present at bedside.  Plan of care discussed with patient in length and they verbalized understanding and agreed with it. Disposition Plan: SNF  Status is: Inpatient  Dispo: The patient is from: Home  Anticipated d/c is to: SNF  Anticipated d/c date is: 02/10/2020  Patient currently is not medically stable to d/c.  Brinda Focht, DO [facility] Direct contact: see [email or web address]  7PM-7AM  contact night coverage as above 02/10/2020, 4:06 PM  LOS: 4 days   I have attemtped to reach the patient's son, Glenda Stevens at the phone number in the computer twice yesterday and once today to discuss the patient. I only ever reach VM. The name given on the VM is not Loucile Posner, but sounds like "Jan" or "Linna Hoff". No message left.

## 2020-02-11 NOTE — Progress Notes (Signed)
PT Cancellation Note  Patient Details Name: Glenda Stevens MRN: [id number] DOB: [birth year]   Cancelled Treatment:    Reason Eval/Treat Not Completed: Fatigue/lethargy limiting ability to participate, pt with Bipap attempted to arouse multiple times, unable, will try again in PM time permitting.  Lyanne Co, DPT Acute Rehabilitation Services [phone]   [place] 02/11/2020, 11:36 AM

## 2020-02-11 NOTE — Plan of Care (Signed)
  Problem: Education: Goal: Knowledge of General Education information will improve Description: Including pain rating scale, medication(s)/side effects and non-pharmacologic comfort measures Outcome: Not Progressing   Problem: Health Behavior/Discharge Planning: Goal: Ability to manage health-related needs will improve Outcome: Not Progressing   

## 2020-02-11 NOTE — Care Management Important Message (Signed)
Important Message  Patient Details  Name: Glenda Stevens MRN: [id number] Date of Birth: 05-05-1945   Medicare Important Message Given:  Yes  Due to staffing called the patient room no answer signed IM mailed to the patient home address.     Berlyn Saylor 02/11/2020, 2:10 PM

## 2020-02-11 NOTE — Plan of Care (Signed)

## 2020-02-11 NOTE — TOC Progression Note (Signed)
Transition of Care [facility]) - Progression Note    Patient Details  Name: Glenda Stevens MRN: [id number] Date of Birth: December 13, 1945  Transition of Care [facility]) CM/SW Contact  Pollie Friar, RN Phone Number: 02/11/2020, 3:49 PM  Clinical Narrative:    CM left voicemail and text for son.  TOC following.   Expected Discharge Plan: Skilled Nursing Facility Barriers to Discharge: Continued Medical Work up  Expected Discharge Plan and Services Expected Discharge Plan: [place]                                               Social Determinants of Health (SDOH) Interventions    Readmission Risk Interventions No flowsheet data found.

## 2020-02-12 ENCOUNTER — Other Ambulatory Visit (HOSPITAL_COMMUNITY): Payer: Medicare Other

## 2020-02-12 ENCOUNTER — Inpatient Hospital Stay (HOSPITAL_COMMUNITY): Payer: Medicare Other

## 2020-02-12 DIAGNOSIS — J9601 Acute respiratory failure with hypoxia: Secondary | ICD-10-CM | POA: Diagnosis not present

## 2020-02-12 DIAGNOSIS — I5021 Acute systolic (congestive) heart failure: Secondary | ICD-10-CM | POA: Diagnosis not present

## 2020-02-12 DIAGNOSIS — F419 Anxiety disorder, unspecified: Secondary | ICD-10-CM | POA: Diagnosis not present

## 2020-02-12 DIAGNOSIS — J810 Acute pulmonary edema: Secondary | ICD-10-CM | POA: Diagnosis not present

## 2020-02-12 DIAGNOSIS — I10 Essential (primary) hypertension: Secondary | ICD-10-CM | POA: Diagnosis not present

## 2020-02-12 LAB — ECHOCARDIOGRAM COMPLETE
Area-P 1/2: 4.36 cm2
Height: 61.5 in
S' Lateral: 2.2 cm
Weight: 1840.01 oz

## 2020-02-12 LAB — CULTURE, BLOOD (ROUTINE X 2)
Culture: NO GROWTH
Culture: NO GROWTH
Special Requests: ADEQUATE
Special Requests: ADEQUATE

## 2020-02-12 LAB — BASIC METABOLIC PANEL
Anion gap: 16 — ABNORMAL HIGH (ref 5–15)
BUN: 35 mg/dL — ABNORMAL HIGH (ref 8–23)
CO2: 22 mmol/L (ref 22–32)
Calcium: 9.6 mg/dL (ref 8.9–10.3)
Chloride: 112 mmol/L — ABNORMAL HIGH (ref 98–111)
Creatinine, Ser: 1.19 mg/dL — ABNORMAL HIGH (ref 0.44–1.00)
GFR, Estimated: 47 mL/min — ABNORMAL LOW (ref >60)
Glucose, Bld: 111 mg/dL — ABNORMAL HIGH (ref 70–99)
Potassium: 2.9 mmol/L — ABNORMAL LOW (ref 3.5–5.1)
Sodium: 150 mmol/L — ABNORMAL HIGH (ref 135–145)

## 2020-02-12 LAB — MAGNESIUM: Magnesium: 1.7 mg/dL (ref 1.7–2.4)

## 2020-02-12 MED ORDER — MAGNESIUM SULFATE 2 GM/50ML IV SOLN
2.0000 g | Freq: Once | INTRAVENOUS | Status: AC
  Administered 2020-02-12: 2 g via INTRAVENOUS
  Filled 2020-02-12: qty 50

## 2020-02-12 MED ORDER — CHLORHEXIDINE GLUCONATE CLOTH 2 % EX PADS
6.0000 | MEDICATED_PAD | Freq: Every day | CUTANEOUS | Status: DC
  Administered 2020-02-12 – 2020-02-13 (×2): 6 via TOPICAL

## 2020-02-12 MED ORDER — POTASSIUM CHLORIDE 10 MEQ/100ML IV SOLN
10.0000 meq | INTRAVENOUS | Status: AC
  Administered 2020-02-12 (×4): 10 meq via INTRAVENOUS
  Filled 2020-02-12 (×4): qty 100

## 2020-02-12 NOTE — Progress Notes (Signed)
PROGRESS NOTE  Glenda Stevens [id number] DOB: 1945/10/27 DOA: 01/29/2020 PCP: Lorrene Reid, PA-C  Brief History   Patient is a 74 year old female with past medical history of hypertension, GERD, arthritis progressive memory loss with self-neglect was found by neighbors outside.  Patient had no idea how long she was here.  EMS was called and put warm blankets around her and brought to the patient for further evaluation and management.  Upon arrival to ED: Patient hypothermic with temperature of 91.5, blood pressure: 99/57, heart rate 92, respiratory rate 22.  Patient notable for rigors, contusion to scalp above left forehead, dry mucous membrane.  AST: 106, ALT: 81, leukocytosis of 13.9, CK: 1737, lactic acid: 2.2.  CT head: Negative for acute findings.  EKG shows sinus rhythm.  Patient admitted for further evaluation and management of rhabdomyolysis and hypothermia.  On 02/10/2020 the patient had an acute change in status with increase in oxygen requirements from room air at 0400 to 100%NRB at 11:00. It seems that this was due to volume overload. IV fluids were stopped, and the patient is receiving diuresis with improvement in her oxygen requirements. CXR this am continues to demonstrate pulmonary edema particularly on the left. Unfortunately recording of I's and O's is lacking, so output cannot be effectively tracked. Continue to diurese and monitor creatinine, electrolytes and volume status to the best of my ability. Echocardiogram report is pending.  Consultants  . None  Procedures  . None  Antibiotics   Anti-infectives (From admission, onward)   None     Subjective  The patient is resting comfortably. She has been found to have severe urinary retention of 1600 cc. Foley catheter has been placed.  Objective   Vitals:  Vitals:   02/12/20 0805 02/12/20 1155  BP:  126/60  Pulse:  85  Resp:  18  Temp:  (!) 97.5 F (36.4 C)  SpO2: 94% 94%   Exam:  Constitutional:  . The  patient is resting comfortably. No acute distress. Respiratory:  . No increased work of breathing. Marland Kitchen No rhonchi, wheezes, or rales. . No tactile fremitus Cardiovascular:  . Regular rate and rhythm . No murmurs, ectopy, or gallups. . No lateral PMI. No thrills. Abdomen:  . Abdomen is soft, non-tender, non-distended . No hernias, masses, or organomegaly . Normoactive bowel sounds.  Musculoskeletal:  . No cyanosis, clubbing, or edema Skin:  . No rashes, lesions, ulcers . palpation of skin: no induration or nodules Neurologic:  . CN 2-12 intact . Sensation all 4 extremities intact Psychiatric:  . Mental status o Mood, affect appropriate o Orientation to person, place, time  . judgment and insight appear intact  I have personally reviewed the following:   Today's Data  . Vitals, BMP, CBC  Micro Data  . Blood cultures x 2: No growth  Imaging  . CTA chest: No PE, positive for pulmonary edema . CXR: Pulmonary edema  Cardiology Data  . EKG . Echocardiogram: pending  Scheduled Meds: . Chlorhexidine Gluconate Cloth  6 each Topical Daily  . enoxaparin (LOVENOX) injection  40 mg Subcutaneous Q24H  . furosemide  40 mg Intravenous BID  . irbesartan  75 mg Oral Daily  . nitroGLYCERIN  1 inch Topical Q6H  . propranolol  20 mg Oral TID  . senna  1 tablet Oral BID   Continuous Infusions:  Active Problems:   Essential hypertension   Anxiety   Poor memory   Rhabdomyolysis   Hypothermia   LOS: 6 days   A &  P  Acute hypoxic respiratory failure: The patient is currently requiring 3L by nasal cannula to maintain saturations in the 90's. Continue diuresis. Echocardiogram is pending. CTA chest demonstrated pulmonary edema. Lasix 40mg  IV given.  Stopped IV fluids. Add 1 " Nitropaste to anterior chest wall. Echocardiogram is pending.  Acute metabolicencephalopathy: Initially due to hypotension, hypothermia in the setting of cold exposure.  CT head negative for acute findings.  Upon arrival she had leukocytosis of 13,000, elevated lactic acid.  Chest x-ray negative for acute findings.  COVID-19 negative. Work-up including UA: Negative, B12, folate, TSH, ethanol: WNL. Ammonia level elevated at 44 was given lactulose per rectal once.  Ammonia level: Improved.  UDS positive for benzos.  Blood culture negative so far. Leukocytosis and lactic acid have trended down. Pt is improved on BIPAP currently. Monitor.  Traumatic Rhabdomyolysis: Much improved CK down to 460 on 02/10/2020. IV fluids discontinued.  AKI:  Resolved. Creatinine 1.19. Monitor. Due to diuresis and urinary retention.  Urinary retention: 1600 cc of PVR in bladder. Foley placed. Will start flomax. This is at least in part a cause for AKI.  Elevated liver enzymes: Resolved. Likely elevated due to rhabdomyolysis.   Hyponatremia: Resolved. Monitor while diuresing.  Hypokalemia: Supplement and monitor.  Hypomagnesemia: Monitor and supplement as necessary.  Hypertension: Blood pressure is elevated. On irbesartan and propranolol at home. Resume irbesartan.  Continue to monitor blood pressure closely.  Hydralazine as needed for blood pressure more than 160/100.  Hyperlipidemia: Hold statin due to elevated liver enzymes  Progressive dementia: Supportive care. Hold off neurology consult at this time.  Tobacco abuse: counseled about cessation.  Alcohol use: 2 glass of wine every night. She has never been a heavy drinker as per son, no illicit drugs-former marijuana use-not anymore.  Ethanol level: WNL.  I have seen and examined this patient myself. I have spent 36 minutes in her evaluation and care.  DVT prophylaxis: Lovenox Code Status: Full code Family Communication: Patient's son present at bedside.  Plan of care discussed with patient in length and they verbalized understanding and agreed with it. Disposition Plan: SNF  Status is: Inpatient  Dispo: The patient is from: Home   Anticipated d/c is to: SNF  Anticipated d/c date is: 02/10/2020  Patient currently is not medically stable to d/c.  Loralai Eisman, DO [facility] Direct contact: see [email or web address]  7PM-7AM contact night coverage as above 02/12/2020, 1:55 PM  LOS: 4 days

## 2020-02-12 NOTE — Plan of Care (Signed)

## 2020-02-12 NOTE — Progress Notes (Signed)
Pt. Noted holding food in mouth. Food cleared from mouth, fluids offered and accepted without difficulty. DO made aware and diet downgraded to dysphagia 2.

## 2020-02-12 NOTE — Progress Notes (Signed)
  Echocardiogram 2D Echocardiogram has been performed.  Glenda Stevens 02/12/2020, 8:57 AM

## 2020-02-12 NOTE — Progress Notes (Signed)
Abdomen noted distended. Bladders scanned pt. Read greater than 999. DO made aware. Ordered In and out cath. In and out cath 1616ml or urine. DO ordered foley cath to be placed.

## 2020-02-13 ENCOUNTER — Inpatient Hospital Stay (HOSPITAL_COMMUNITY): Payer: Medicare Other

## 2020-02-13 DIAGNOSIS — F419 Anxiety disorder, unspecified: Secondary | ICD-10-CM | POA: Diagnosis not present

## 2020-02-13 DIAGNOSIS — G9341 Metabolic encephalopathy: Secondary | ICD-10-CM | POA: Diagnosis not present

## 2020-02-13 DIAGNOSIS — R413 Other amnesia: Secondary | ICD-10-CM | POA: Diagnosis not present

## 2020-02-13 DIAGNOSIS — M6282 Rhabdomyolysis: Secondary | ICD-10-CM

## 2020-02-13 DIAGNOSIS — I10 Essential (primary) hypertension: Secondary | ICD-10-CM | POA: Diagnosis not present

## 2020-02-13 LAB — GLUCOSE, CAPILLARY
Glucose-Capillary: 93 mg/dL (ref 70–99)
Glucose-Capillary: 135 mg/dL — ABNORMAL HIGH (ref 70–99)

## 2020-02-13 LAB — BLOOD GAS, ARTERIAL
Acid-base deficit: 1 mmol/L (ref 0.0–2.0)
Acid-base deficit: 1.6 mmol/L (ref 0.0–2.0)
Bicarbonate: 21.6 mmol/L (ref 20.0–28.0)
Bicarbonate: 21 mmol/L (ref 20.0–28.0)
FIO2: 28
FIO2: 21
O2 Saturation: 97 %
O2 Saturation: 93.6 %
Patient temperature: 37.2
Patient temperature: 37.2
pCO2 arterial: 27 mmHg — ABNORMAL LOW (ref 32.0–48.0)
pCO2 arterial: 26.4 mmHg — ABNORMAL LOW (ref 32.0–48.0)
pH, Arterial: 7.516 — ABNORMAL HIGH (ref 7.350–7.450)
pH, Arterial: 7.513 — ABNORMAL HIGH (ref 7.350–7.450)
pO2, Arterial: 87.7 mmHg (ref 83.0–108.0)
pO2, Arterial: 68.2 mmHg — ABNORMAL LOW (ref 83.0–108.0)

## 2020-02-13 LAB — BASIC METABOLIC PANEL
Anion gap: 17 — ABNORMAL HIGH (ref 5–15)
Anion gap: 17 — ABNORMAL HIGH (ref 5–15)
Anion gap: 16 — ABNORMAL HIGH (ref 5–15)
BUN: 35 mg/dL — ABNORMAL HIGH (ref 8–23)
BUN: 35 mg/dL — ABNORMAL HIGH (ref 8–23)
BUN: 36 mg/dL — ABNORMAL HIGH (ref 8–23)
CO2: 23 mmol/L (ref 22–32)
CO2: 19 mmol/L — ABNORMAL LOW (ref 22–32)
CO2: 20 mmol/L — ABNORMAL LOW (ref 22–32)
Calcium: 9.3 mg/dL (ref 8.9–10.3)
Calcium: 8.8 mg/dL — ABNORMAL LOW (ref 8.9–10.3)
Calcium: 8.3 mg/dL — ABNORMAL LOW (ref 8.9–10.3)
Chloride: 112 mmol/L — ABNORMAL HIGH (ref 98–111)
Chloride: 113 mmol/L — ABNORMAL HIGH (ref 98–111)
Chloride: 114 mmol/L — ABNORMAL HIGH (ref 98–111)
Creatinine, Ser: 1.15 mg/dL — ABNORMAL HIGH (ref 0.44–1.00)
Creatinine, Ser: 1.13 mg/dL — ABNORMAL HIGH (ref 0.44–1.00)
Creatinine, Ser: 1.2 mg/dL — ABNORMAL HIGH (ref 0.44–1.00)
GFR, Estimated: 49 mL/min — ABNORMAL LOW (ref >60)
GFR, Estimated: 50 mL/min — ABNORMAL LOW (ref >60)
GFR, Estimated: 47 mL/min — ABNORMAL LOW (ref >60)
Glucose, Bld: 123 mg/dL — ABNORMAL HIGH (ref 70–99)
Glucose, Bld: 111 mg/dL — ABNORMAL HIGH (ref 70–99)
Glucose, Bld: 114 mg/dL — ABNORMAL HIGH (ref 70–99)
Potassium: 2.3 mmol/L — CL (ref 3.5–5.1)
Potassium: 3.5 mmol/L (ref 3.5–5.1)
Potassium: 3.6 mmol/L (ref 3.5–5.1)
Sodium: 152 mmol/L — ABNORMAL HIGH (ref 135–145)
Sodium: 149 mmol/L — ABNORMAL HIGH (ref 135–145)
Sodium: 150 mmol/L — ABNORMAL HIGH (ref 135–145)

## 2020-02-13 MED ORDER — POTASSIUM CHLORIDE 10 MEQ/100ML IV SOLN
10.0000 meq | INTRAVENOUS | Status: AC
  Administered 2020-02-13 (×4): 10 meq via INTRAVENOUS
  Filled 2020-02-13 (×4): qty 100

## 2020-02-13 MED ORDER — SODIUM CHLORIDE 0.9 % IV BOLUS
250.0000 mL | Freq: Once | INTRAVENOUS | Status: AC
  Administered 2020-02-13: 250 mL via INTRAVENOUS

## 2020-02-13 MED ORDER — HEPARIN (PORCINE) 25000 UT/250ML-% IV SOLN
850.0000 [IU]/h | INTRAVENOUS | Status: DC
  Administered 2020-02-13: 850 [IU]/h via INTRAVENOUS
  Filled 2020-02-13: qty 250

## 2020-02-13 MED ORDER — SODIUM CHLORIDE 0.9 % IV SOLN: INTRAVENOUS | Status: DC

## 2020-02-13 MED ORDER — POTASSIUM CHLORIDE IN NACL 20-0.45 MEQ/L-% IV SOLN
INTRAVENOUS | Status: DC
  Administered 2020-02-13: 100 mL/h via INTRAVENOUS
  Filled 2020-02-13 (×2): qty 1000

## 2020-02-13 MED ORDER — SODIUM CHLORIDE 0.45 % IV SOLN: INTRAVENOUS | Status: DC

## 2020-03-11 NOTE — Progress Notes (Signed)
ANTICOAGULATION CONSULT NOTE - Initial Consult  Pharmacy Consult for Heparin Indication: rule out pulmonary embolus  No Known Allergies  Patient Measurements: Height: 5' 1.5" (156.2 cm) Weight: 52.2 kg (115 lb) IBW/kg (Calculated) : 48.95 Heparin Dosing Weight: 52 kg  Vital Signs: Temp: 99.2 F (37.3 C) (12/05 1444) Temp Source: Axillary (12/05 1444) BP: 96/68 (12/05 1910) Pulse Rate: 98 (12/05 1444)  Labs: Recent Labs    02/11/20 0625 02/11/20 1235 02/11/20 1401 02/12/20 1129 02/17/20 0103 2020/02/17 1034 02/17/20 1546  HGB 13.3  --   --   --   --   --   --   HCT 40.3  --   --   --   --   --   --   PLT 168  --   --   --   --   --   --   CREATININE 1.26*  --   --    < > 1.15* 1.13* 1.20*  TROPONINIHS  --  100* 165*  --   --   --   --    < > = values in this interval not displayed.    Estimated Creatinine Clearance: 30.9 mL/min (A) (by C-G formula based on SCr of 1.2 mg/dL (H)).   Medical History: Past Medical History:  Diagnosis Date  . Allergy    Hay fever  . Anxiety   . Arthritis   . Cataract   . 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 GERD (gastroesophageal reflux disease)   . History of chicken pox   . Hyperlipidemia   . Hypertension   . Memory loss    forgetfull, sundowning  . Phlebitis and thrombophlebitis   . PONV (postoperative nausea and vomiting)   . Renal artery stenosis ([facility])    left stent in 2003  . Renovascular hypertension   . 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 Now with concern for pulmonary embolus and pharmacy consulted to start heparin for anticoagulation - no bolus requested by MD.   The patient was noted to have a CTA on 12/2 which was negative for PE. The patient has been receiving lovenox for VTE prophylaxis - last dose on 12/4 evening.   Goal of Therapy:  Heparin level 0.3-0.7  units/ml Monitor platelets by anticoagulation protocol: Yes   Plan:   - No bolus as requested by MD - Start Heparin at 850 units/hr (8.5 ml/hr) - Daily HL, CBC - Will continue to monitor for any signs/symptoms of bleeding and will follow up with heparin level in 8 hours   Thank you for allowing pharmacy to be a part of this patient's care.  Alycia Rossetti, PharmD, BCPS Clinical Pharmacist Clinical phone for 2020/02/17: [phone] February 17, 2020 7:51 PM   **Pharmacist phone directory can now be found on [email or web address] ([email or web address]).  Listed under [place].

## 2020-03-11 NOTE — Progress Notes (Signed)
   02/14/20 1225  Assess: MEWS Score  Temp 98.8 F (37.1 C)  BP 108/60  Pulse Rate 100  ECG Heart Rate 100  Resp (!) 28  SpO2 97 %  O2 Device Room Air  Assess: MEWS Score  MEWS Temp 0  MEWS Systolic 0  MEWS Pulse 0  MEWS RR 2  MEWS LOC 3  MEWS Score 5  MEWS Score Color Red  Assess: if the MEWS score is Yellow or Red  Were vital signs taken at a resting state? Yes  Focused Assessment Change from prior assessment (see assessment flowsheet)  Early Detection of Sepsis Score *See Row Information* Low  MEWS guidelines implemented *See Row Information* Yes  Treat  MEWS Interventions Consulted Respiratory Therapy  Pain Scale PAINAD  Pain Score Asleep  Take Vital Signs  Increase Vital Sign Frequency  Red: Q 1hr X 4 then Q 4hr X 4, if remains red, continue Q 4hrs  Escalate  MEWS: Escalate Red: discuss with charge nurse/RN and provider, consider discussing with RRT (There was no charge nurse today / spoke with MD)  Notify: Provider  Provider Name/Title Dr. Benny Lennert  Date Provider Notified 02-14-20  Time Provider Notified 1220  Notification Type Page  Notification Reason Other (Comment) (update on status)  Response See new orders  Date of Provider Response 02/14/2020  Time of Provider Response 1230  Notify: Rapid Response  Name of Rapid Response RN Notified N/A  Document  Patient Outcome Not stable and remains on department  Progress note created (see row info) Yes   Patient's son called Probation officer, requesting to see his mother.  MD informed.  Wants to speak with him once he is available.

## 2020-03-11 NOTE — Progress Notes (Signed)
   2020-02-25 1815  Assess: MEWS Score  BP (!) 61/41  Pulse Rate (!) 124  Level of Consciousness Unresponsive  SpO2 (!) 72 %  O2 Device Nasal Cannula  O2 Flow Rate (L/min) 3 L/min  Assess: MEWS Score  MEWS Temp 0  MEWS Systolic 3  MEWS Pulse 2  MEWS RR 2  MEWS LOC 3  MEWS Score 10  MEWS Score Color Red  Assess: if the MEWS score is Yellow or Red  Were vital signs taken at a resting state? Yes  Focused Assessment Change from prior assessment (see assessment flowsheet)  Early Detection of Sepsis Score *See Row Information* Low  MEWS guidelines implemented *See Row Information* No, previously red, continue vital signs every 4 hours  Treat  Pain Scale PAINAD  Pain Score 0  Take Vital Signs  Increase Vital Sign Frequency  Red: Q 1hr X 4 then Q 4hr X 4, if remains red, continue Q 4hrs  Escalate  MEWS: Escalate  (No charge nurse available)  Notify: Provider  Provider Name/Title Dr. Benny Lennert  Date Provider Notified 2020/02/25  Time Provider Notified 0459  Notification Type Page  Notification Reason Change in status  Response See new orders  Date of Provider Response 2020-02-25  Time of Provider Response 1825  Notify: Rapid Response  Date Rapid Response Notified 02/25/2020  Time Rapid Response Notified 1815  Document  Patient Outcome Not stable and remains on department  Progress note created (see row info) Yes   Attempt made to assist respiratory with Bi-pap without success by Rapid Response Nurse.  Not tolerated by patient.  Patient experiencing increased mottling in all extremities, which are cool to touch.  MD in contact.  Patient's pupils reactive, but very slightly.  Pupils=5 mm bilaterally.  Eyes deviating to the left.  No tracking noted.  Moaning with labored, rhonchus respirations.  CXR improved.  CT apparently negative.

## 2020-03-11 NOTE — Progress Notes (Signed)
Patient found to be unresponsive at initial assessment.   MD notified.  See NO

## 2020-03-11 NOTE — Significant Event (Addendum)
Rapid Response Event Note   Reason for Call :  Increased work of breathing  Initial Focused Assessment:  Pt lying in bed, in distress. PERRLA, 84mm. Lung sounds are rhonchus. Increased work of breathing, tachypneic. Abdomen is soft. HOB elevated >45 degrees. Pt placed on 100% NRB. Skin mottled, cool to touch.  VS: BP 96/68, HR 131, RR 44, SpO2 94% on 100% NRB  Interventions:  -No intervention from RR RN  Plan of Care:  -Notify primary provider of acute change -Notify family of decline in pt status  Event Summary:  MD Notified: Dr. Benny Lennert Call Time: [place] Time: 1851 End Time: [place], RN

## 2020-03-11 NOTE — Progress Notes (Addendum)
PROGRESS NOTE  Glenda Stevens ZHG:[id number] DOB: February 17, 1946 DOA: 01/24/2020 PCP: Lorrene Reid, PA-C  Brief History   Patient is a 75 year old female with past medical history of hypertension, GERD, arthritis progressive memory loss with self-neglect was found by neighbors outside.  Patient had no idea how long she was here.  EMS was called and put warm blankets around her and brought to the patient for further evaluation and management.  Upon arrival to ED: Patient hypothermic with temperature of 91.5, blood pressure: 99/57, heart rate 92, respiratory rate 22.  Patient notable for rigors, contusion to scalp above left forehead, dry mucous membrane.  AST: 106, ALT: 81, leukocytosis of 13.9, CK: 1737, lactic acid: 2.2.  CT head: Negative for acute findings.  EKG shows sinus rhythm.  Patient admitted for further evaluation and management of rhabdomyolysis and hypothermia.  On 02/10/2020 the patient had an acute change in status with increase in oxygen requirements from room air at 0400 to 100%NRB at 11:00. It seems that this was due to volume overload. IV fluids were stopped, and the patient is receiving diuresis with improvement in her oxygen requirements. CXR this am continues to demonstrate pulmonary edema particularly on the left. Unfortunately recording of I's and O's is lacking, so output cannot be effectively tracked. Continue to diurese and monitor creatinine, electrolytes and volume status to the best of my ability.   Echocardiogram demonstrates EF 65-70% with normal LV function. No regional wall motion abnormalities. Grade I diastolic dysfunction (impaired relaxation) is present. RV systolic function is mildly reduced.   Consultants  . None  Procedures  . None  Antibiotics   Anti-infectives (From admission, onward)   None     Subjective  The patient is resting comfortably. She has been found to have severe urinary retention of 1600 cc. Foley catheter has been placed.  Objective     Vitals:  Vitals:   03-04-2020 0729 03-04-20 1225  BP: 114/69 108/60  Pulse: 95 100  Resp:  (!) 28  Temp: 99 F (37.2 C) 98.8 F (37.1 C)  SpO2: 96% 97%   Exam:  Constitutional:  . The patient is sleeping. She responds to stimuli with stirring, but does not open her eyes or respond verbally. Respiratory:  . No increased work of breathing. . Tachypneic. Marland Kitchen No rhonchi, wheezes, or rales. . No tactile fremitus Cardiovascular:  . Regular rate and rhythm . No murmurs, ectopy, or gallups. . No lateral PMI. No thrills. Abdomen:  . Abdomen is soft, non-tender, non-distended . No hernias, masses, or organomegaly . Normoactive bowel sounds.  Musculoskeletal:  . No cyanosis, clubbing, or edema Skin:  . No rashes, lesions, ulcers . palpation of skin: no induration or nodules Neurologic:  . Unable to evaluate as the patient is unable to cooperate with exam. Psychiatric:  Unable to evaluate as the patient is unable to cooperate with exam.  I have personally reviewed the following:   Today's Data  . Vitals, BMP, ABG  Micro Data  . Blood cultures x 2: No growth  Imaging  . CTA chest: No PE, positive for pulmonary edema . CXR: Pulmonary edema  Cardiology Data  . EKG . Echocardiogram  Scheduled Meds: . Chlorhexidine Gluconate Cloth  6 each Topical Daily  . enoxaparin (LOVENOX) injection  40 mg Subcutaneous Q24H  . irbesartan  75 mg Oral Daily  . nitroGLYCERIN  1 inch Topical Q6H  . propranolol  20 mg Oral TID  . senna  1 tablet Oral BID   Continuous  Infusions: . 0.45 % NaCl with KCl 20 mEq / L 100 mL/hr (03-06-20 0931)    Active Problems:   Essential hypertension   Anxiety   Poor memory   Rhabdomyolysis   Hypothermia   LOS: 7 days   A & P  Acute hypoxic respiratory failure: The patient is currently requiring 3L by nasal cannula to maintain saturations in the 90's. Continue diuresis. Echocardiogram is pending. CTA chest demonstrated pulmonary edema. Lasix 40mg   IV given.  Stopped IV fluids. Add 1 " Nitropaste to anterior chest wall. Echocardiogram demonstrates EF 65-70% with normal LV function. No regional wall motion abnormalities. Grade I diastolic dysfunction (impaired relaxation) is present. RV systolic function is mildly reduced.   Acute metabolicencephalopathy: Initially due to hypotension, hypothermia in the setting of cold exposure.  CT head negative for acute findings. Upon arrival she had leukocytosis of 13,000, elevated lactic acid.  Chest x-ray negative for acute findings.  COVID-19 negative. Work-up including UA: Negative, B12, folate, TSH, ethanol: WNL. Ammonia level elevated at 44 was given lactulose per rectal once.  Ammonia level: Improved.  UDS positive for benzos.  Blood culture negative so far. Leukocytosis and lactic acid have trended down. Pt initially improved on BIPAP. She has now been weaned off of O2. She is saturating 96% on room air this morning. Encephalopathy appears to be metabolic. ABG was significantly alkalotic this morning which was in keeping with chemistries drawn in the early morning hours. Repeat chemistry and ABG ordered as well as CT head.  Traumatic Rhabdomyolysis: Much improved CK down to 460 on 02/10/2020. IV fluids discontinued.  AKI:  Resolved. Creatinine 1.13. Monitor. Due to diuresis and urinary retention.  Urinary retention: 1600 cc of PVR in bladder. Foley placed. Will start flomax. This is at least in part a cause for AKI. Concern for post obstructive diuresis.  Elevated liver enzymes: Resolved. Likely elevated due to rhabdomyolysis.   Hypernatremia: Mild. 1/2 NS with K given. Monitor.  Hypokalemia: Supplement and monitor.  Hypomagnesemia: Monitor and supplement as necessary.  Hypertension: Blood pressure is low-normal. On irbesartan and propranolol at home. Resumed here.  Continue to monitor blood pressure closely.  Hydralazine as needed for blood pressure more than 160/100.  Hyperlipidemia:  Hold statin due to elevated liver enzymes  Progressive dementia: Supportive care. ? Baseline mental status.  Tobacco abuse: counseled about cessation.  Alcohol use: 2 glass of wine every night. She has never been a heavy drinker as per son, no illicit drugs-former marijuana use-not anymore.  Ethanol level: WNL.  I have seen and examined this patient myself. I have spent 42 minutes in her evaluation and care.  DVT prophylaxis: Lovenox Code Status: Full code Family Communication: None available  Disposition Plan: SNF  Status is: Inpatient  Dispo: The patient is from: Home  Anticipated d/c is to: SNF  Anticipated d/c date is: 02/10/2020  Patient currently is not medically stable to d/c.  Dewarren Ledbetter, DO [facility] Direct contact: see [email or web address]  7PM-7AM contact night coverage as above 06-Mar-2020, 1:23 PM  LOS: 4 days   The patient was sent for CT head due to decreased level of responsiveness and tachypnea. No acute abnormality. CXR was also preformed and demonstrated significant interval improvement in aeration with near resolution of lung opacities. However, when patient returned to floor from CT, she was mottled with HR in the 130, SaO2 of 70% and hypotension. I have ordered stat CTA chest, heparin drip without bolus, and an EKG. Physician on-call called to advise  of situation.

## 2020-03-11 NOTE — Progress Notes (Signed)
   21-Feb-2020 0729  Assess: MEWS Score  Temp 99 F (37.2 C)  BP 114/69  Pulse Rate 95  Level of Consciousness Unresponsive  SpO2 96 %  O2 Device Nasal Cannula  Patient Activity (if Appropriate) In bed  O2 Flow Rate (L/min) 3 L/min  Assess: MEWS Score  MEWS Temp 0  MEWS Systolic 0  MEWS Pulse 0  MEWS RR 0  MEWS LOC 3  MEWS Score 3  MEWS Score Color Yellow  Assess: if the MEWS score is Yellow or Red  Were vital signs taken at a resting state? Yes  Focused Assessment Change from prior assessment (see assessment flowsheet)  Early Detection of Sepsis Score *See Row Information* Low  MEWS guidelines implemented *See Row Information* Yes  Treat  MEWS Interventions Consulted Respiratory Therapy;Escalated (See documentation below)  Pain Scale PAINAD  Pain Score Asleep  Breathing 0  Negative Vocalization 0  Facial Expression 0  Body Language 1  Consolability 0  PAINAD Score 1  Take Vital Signs  Increase Vital Sign Frequency  Yellow: Q 2hr X 2 then Q 4hr X 2, if remains yellow, continue Q 4hrs  Escalate  MEWS: Escalate Yellow: discuss with charge nurse/RN and consider discussing with provider and RRT  Notify: Charge Nurse/RN  Date Charge Nurse/RN Notified 02/21/20  Time Charge Nurse/RN Notified 0800  Notify: Provider  Provider Name/Title Dr. Benny Lennert  Date Provider Notified 02/21/2020  Time Provider Notified [phone]  Notification Type Page  Notification Reason Change in status  Response See new orders  Date of Provider Response 21-Feb-2020  Time of Provider Response 0734  Notify: Rapid Response  Name of Rapid Response RN Notified Saralyn Pilar  Date Rapid Response Notified 02/21/20  Time Rapid Response Notified 0735  Document  Patient Outcome Not stable and remains on department  Progress note created (see row info) Yes   CBG obtained.  WNL.  Pt remains unresponsive.  GCS=6.  MD aware.  Awaiting ABG results.  Rapid response also notified.

## 2020-03-11 NOTE — Progress Notes (Signed)
Pt back from CT. Results not known as of yet. sats down to 81% on R/A. placed on 3 lpm via n/c and is now 92. p=110-125. Tachypneic with unresponsive pupils (5) which trend to the left. MD notified.  All limbs hypertonic

## 2020-03-11 NOTE — Progress Notes (Signed)
Son in to visit.  Discussed patient's status with him and he had multiple questions, which were answered to his satisfaction by this nurse and by Dr. Benny Lennert.  Son did make clear to this nurse that he did not want extraordinary measures taken should his mother suffer with a cardiac or respiratory event requiring a vent, defibrillation, and or ALS medication for support.  He left his number with me and this was transferred to the "team sticky notes" and contact list.

## 2020-03-11 NOTE — Progress Notes (Signed)
HOSPITAL MEDICINE OVERNIGHT EVENT NOTE    I went to assess the patient at the bedside at approximately 8PM after a discussion with Dr. Benny Lennert about patient's deteriorating condition.  Patient has been experiencing progressively worsening respiratory distress and hypoxic respiratory failure throughout the day, now on 100% NRB mask.  Chart reviewed - patient underwent CXR earlier in the day which revealed improving pulmonary edema.  CT head revealed no acute finding.  ABG performed earlier in the afternoon didn't appear as severe as the patient's current condition.  I went to evaluate the patient at the bedside.  Patient is in respiratory distress on a NRB mask.  Patient is not responding to verbal stimuli, nonlocalizing response to painful stimuli.  Pupils are reactive B/L.  Extremities are appearing cyanotic.    Dr. Benny Lennert stated she initiated a heparin infusion for possible acute PE, I agree with this intervention and have continued this.  A CTA chest had been ordered however patient is too unstable to be sent to the CT scanner.  I confirmed the patient is DNR/DNI and therefore placed the patient on BIPAP.  Troponin, ECG, repeat ABG (after 30 min of BiPAP) and Lactic acid were ordered.  IVF bolus for hypotension (98/68 at time of bedside evaluation) also ordered.  ECG reveals no evidence of STEMI.   I called the son and informed him of the patient's rapidly deteriorating condition and ask him to come to the hospital.    Upon patient's arrival to the unit, patient unfortunately has expired. Time of death 2113-06-03.  Nursing has pronounced.  I spoke to the son at the bedside, offered my condolences and answered all questions.  Son will notify nursing of desired funeral home.   Glenda Emerald  MD [facility]

## 2020-03-11 NOTE — TOC Progression Note (Addendum)
Transition of Care [facility]) - Progression Note    Patient Details  Name: Glenda Stevens MRN: [id number] Date of Birth: [birth year]  Transition of Care [facility]) CM/SW [place], [place] Phone Number: 03-13-2020, 9:29 AM  Clinical Narrative:    CSW attempted x2 to contact pt's son [name]) [name]. No one has been able to contact him within the last few days. Per the note, bed offers were given, but he has not made a choice yet. Pt is currently not oriented or alert enough to make these decisions. CSW contacted the other emergency contact, [name], who is the pt's long time friend. She confirmed the sons phone number and provided an email ([email or web address]), but she thinks it might be an old email.  [name] states she will also try to contact him, and have him call CSW. She noted that it was not abnormal for him to be hard to reach. She will do some footwork to see if she can contact him CSW will continue to follow for bed choice and DC planning.  3:00 pm [name] called back with the following numbers: the sons workplace at [employer] [phone]) and his neighbors [name] and [name] [phone]). While CSW did not provide very much information to [name] without consent, she noted that she has been friends with pt for almost 30 years and is available should we need anything. SW will continue to follow.  Expected Discharge Plan: Skilled Nursing Facility Barriers to Discharge: Continued Medical Work up  Expected Discharge Plan and Services Expected Discharge Plan: [place]                                               Social Determinants of Health (SDOH) Interventions    Readmission Risk Interventions No flowsheet data found.

## 2020-03-11 NOTE — Discharge Summary (Signed)
Physician Death Summary  Glenda Stevens XMI:[id number] DOB: 05/29/1945 DOA: 02/09/2020  PCP: Lorrene Reid, PA-C  Admit date: February 09, 2020 Date and time of death : 21:15,02/16/20  Discharge Diagnoses: Principal diagnosis is #1 1. Flash pulmonary edema 2. Acute hypoxic respiratory failure 3. Acute metabolic encephalopathy 4. Traumatic rhabdomyolysis 5. AKI 6. Urinary retention 7. Elevated liver enzymes 8. Hypernatremia 9. Hypokalemia 10.  Hypomagnesemia 11. Hypertension 12.  Hyperlipidemia 13. Progressive dementia 14. Tobacco abuse 15. Alcohol use  Filed Weights   02/10/20 0955  Weight: 52.2 kg    History of present illness: Glenda Stevens is a 75 y.o. female with medical history significant of HTN, progressive memory loss with self neglect -fails to take medications, eat, frequent falls. Wa found by neighbors earlier on the day of admission down behind the stairs where she lives, evidently since the preceeding evening. It appears she fell. Was evidently concious at the time she was found. EMS wall called. The patient was initially very cold with Temp in the 80's. She was starting on warming blankets and transported to [facility]   ED Course: T 91.5 rising to 95.6 after several hours in warming blanket. BP 99/57, HR 92, RR 21. ED-MD exam notable for rigors, contusion to scalp above left forehead, dry mucus membranes, acral cyanosis.  Cmet revealed low Albumin at 3.3, ST 106, ALT 81, WBC 13.9 w/ 82/5/10. CK total 1,737. Lactic acid 2.2. CT head w/o acute injury. EKG with SR. TRH called to admit patient for treatment of rhabdomyolysis and hypothermia.  Hospital Course: On 02/10/2020 the patient had an acute change in status with increase in oxygen requirements from room air at 0400 to 100%NRB at 11:00. It seems that this was due to volume overload. IV fluids were stopped, and the patient is receiving diuresis with improvement in her oxygen requirements. CXR this am continues to demonstrate  pulmonary edema particularly on the left. Unfortunately recording of I's and O's is lacking, so output cannot be effectively tracked. Continue to diurese and monitor creatinine, electrolytes and volume status to the best of my ability.   Echocardiogram demonstrates EF 65-70% with normal LV function. No regional wall motion abnormalities. Grade I diastolic dysfunction (impaired relaxation) is present. RV systolic function is mildly reduced.   On 16-Feb-2020 the patient had acutely decreased level of consciousness and tachypnea. The patient was sent for CT head due to decreased level of responsiveness and tachypnea. No acute abnormality. CXR was also preformed and demonstrated significant interval improvement in aeration with near resolution of lung opacities. However, when patient returned to floor from CT, she was mottled with HR in the 130, SaO2 of 70% and hypotension. I have ordered stat CTA chest, heparin drip without bolus, and an EKG. Dolly Rias, MD, the physician on-call for the night was called to advise of situation.   He went to evaluate the patient and observed that the patient was too unstable to go for CTA chest. Heparin drip was started. She was in respiratory distress on a NRB. He discussed the patient with her son. ECG was obtained and demonstrated no evidence of STEMI. She was placed on BIPAP, and she was given an IV fluid bolus as she had become hypotensive. DNR/DNI was confirmed with her son.  The patient was pronounced dead at 06/02/13.  Today's assessment: Please see progress note on 02/16/20 for last physical exam on the date of death.  O: Vitals:  Vitals:   02/16/20 06/02/2036 02-16-20 2040-06-02  BP: (!) 58/30 98/62  Pulse:  Resp:    Temp:    SpO2:      No Known Allergies  The results of significant diagnostics from this hospitalization (including imaging, microbiology, ancillary and laboratory) are listed below for reference.    Significant Diagnostic Studies: CT HEAD WO  CONTRAST  Result Date: 03/10/20 CLINICAL DATA:  Delirium, acute hypoxic respiratory failure EXAM: CT HEAD WITHOUT CONTRAST TECHNIQUE: Contiguous axial images were obtained from the base of the skull through the vertex without intravenous contrast. COMPARISON:  CT 02/05/2020, MR 12/25/2005 FINDINGS: Brain: No evidence of acute infarction, hemorrhage, hydrocephalus, extra-axial collection, visible mass lesion or mass effect. Symmetric prominence of the ventricles, cisterns and sulci compatible with parenchymal volume loss and ex vacuo dilatation of the ventricles. Mixed patchy and confluent areas of white matter hypoattenuation are most compatible with chronic microvascular angiopathy. Vascular: Atherosclerotic calcification of the carotid siphons. No hyperdense vessel. Skull: No calvarial fracture or suspicious osseous lesion. No scalp swelling or hematoma. Sinuses/Orbits: Redemonstrated bony remodeling of the orbital floors bilaterally with thickening, expansion of the infraorbital nerves compatible with schwannoma as previously characterized. Prior lens extractions. Small bilateral mastoid effusions, right greater than left. Paranasal sinuses are otherwise predominantly clear. Other: None. IMPRESSION: 1. No acute intracranial abnormality. 2. Chronic microvascular angiopathy and parenchymal volume loss, similar to prior. 3. Redemonstrated bony remodeling of the orbital floors bilaterally with thickening, expansion of the infraorbital nerves compatible with schwannoma as previously characterized. 4. Small bilateral mastoid effusions, right greater than left. Electronically Signed   By: Lovena Le M.D.   On: March 10, 2020 17:38   CT Head Wo Contrast  Result Date: 01/10/2020 CLINICAL DATA:  Altered mental status.  Recent fall EXAM: CT HEAD WITHOUT CONTRAST TECHNIQUE: Contiguous axial images were obtained from the base of the skull through the vertex without intravenous contrast. COMPARISON:  December 12, 2017  head CT.  Brain MRI December 25, 2005 FINDINGS: Brain: Stable mild to moderate diffuse atrophy. There is no intracranial mass, hemorrhage, extra-axial fluid collection, or midline shift. There is small vessel disease throughout the centra semiovale bilaterally. Small vessel disease is noted in the anterior limbs of each external capsule and internal capsule. No acute infarct is appreciable. Vascular: No hyperdense vessel. There is calcification in each carotid siphon region. Skull: Bony calvarium appears intact. Sinuses/Orbits: Previous erosion and remodeling of the orbital floor regions with well-defined infraorbital masses, likely schwannoma is given previous appearance. Lesions are stable compared to 2007. Paranasal sinuses clear. Other: There is opacification in multiple mastoid air cells on the left. There are several mastoid air cells inferiorly opacified on the right. IMPRESSION: 1. 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 No acute infarct. No intra-axial mass or hemorrhage. 2. 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 3.  Foci of arterial vascular calcification noted. 4. Opacification of multiple mastoid air cells, more severe on the left than the right. Electronically Signed   By: Lowella Grip III M.D.   On: 01/30/2020 15:37   CT ANGIO CHEST PE W OR WO CONTRAST  Result Date: 02/10/2020 CLINICAL DATA:  Pt is extremely confused, disoriented; Pt has O2 on, denies chest pain or SOB. EXAM: CT ANGIOGRAPHY CHEST WITH CONTRAST TECHNIQUE: Multidetector CT imaging of the chest was performed using the standard protocol during bolus administration of intravenous contrast. Multiplanar CT image reconstructions and MIPs were obtained to evaluate the vascular anatomy. CONTRAST:  85mL OMNIPAQUE IOHEXOL 350 MG/ML SOLN COMPARISON:  Chest CT, 08/04/2014.  Chest radiograph, 01/26/2020. FINDINGS: Cardiovascular: Pulmonary  arteries are well opacified. There is no evidence of a pulmonary embolism. Heart is normal in size and configuration. 2 vessel coronary artery calcifications. No pericardial effusion. Great vessels are normal in caliber. Aortic atherosclerosis. No dissection. Mediastinum/Nodes: No neck base, axillary, mediastinal or hilar masses or enlarged lymph nodes. Generalized increased attenuation in the mediastinal fat consistent with edema. Trachea and esophagus are unremarkable. Lungs/Pleura: Small right and trace left pleural effusions. Bilateral, predominantly ground-glass airspace lung opacities, most evident in the upper lobes, right greater than left. Mild interstitial thickening in the lower lungs. No lung masses or suspicious nodules. Intervening cystic spaces are noted within the areas of ground-glass opacity. No pneumothorax. Upper Abdomen: Partly imaged, mildly dilated bilateral intrarenal collecting systems. Aortic atherosclerosis. Musculoskeletal: Fracture of the mid sternum, which appears chronic. Compression fracture of L1 which also appears chronic. No other fractures. No bone lesions. Review of the MIP images confirms the above findings. IMPRESSION: 1. No evidence of a pulmonary embolism. 2. Bilateral, right greater than left, areas of ground-glass lung opacity associated with small right and trace left pleural effusions and lower lung zone interstitial thickening. These findings are new from the recent prior chest radiograph. This relatively rapid change supports pulmonary edema although multifocal infection is possible. 3. Coronary artery calcifications and aortic atherosclerosis. 4. Mild dilation of the intrarenal collecting systems, incompletely imaged on this exam, of unclear etiology and chronicity. This was not evident on an abdomen and pelvis CT from 08/04/2014. Consider follow-up assessment with either renal ultrasound or abdomen and pelvis CT. Aortic Atherosclerosis (ICD10-I70.0). Electronically  Signed   By: Lajean Manes M.D.   On: 02/10/2020 10:47   US RENAL  Result Date: 02/10/2020 CLINICAL DATA:  Dilated renal collecting system. EXAM: RENAL / URINARY TRACT ULTRASOUND COMPLETE COMPARISON:  Same day CT chest as well as abdominal ultrasound dated 02/08/2020. FINDINGS: Right Kidney: Renal measurements: 10.5 x 3.7 x 5.2 cm = volume: 105 mL. Echogenicity within normal limits. There is mild hydronephrosis. No mass visualized. Left Kidney: Renal measurements: 9.4 x 6.9 x 5.5 cm = volume: 184 mL. Echogenicity within normal limits. There is mild hydronephrosis. No mass visualized. Bladder: Appears normal for degree of bladder distention. Bilateral ureteral jets were not visualized. The patient was unable to void. Other: Small right pleural effusion. IMPRESSION: 1. Mild bilateral hydronephrosis. 2. Small right pleural effusion. Electronically Signed   By: Zerita Boers M.D.   On: 02/10/2020 15:46   DG Pelvis Portable  Result Date: 01/12/2020 CLINICAL DATA:  Unwitnessed fall EXAM: PORTABLE PELVIS 1-2 VIEWS COMPARISON:  08/04/2014 FINDINGS: There is no evidence of pelvic fracture or diastasis. No pelvic bone lesions are seen. IMPRESSION: Negative. Electronically Signed   By: Davina Poke D.O.   On: 02/05/2020 16:11   DG CHEST PORT 1 VIEW  Result Date: Mar 09, 2020 CLINICAL DATA:  Tachypnea. EXAM: PORTABLE CHEST 1 VIEW COMPARISON:  02/11/2020 FINDINGS: Interstitial and hazy airspace lung opacities noted on the prior study have significantly improved. No new lung abnormalities. Lungs remain hyperexpanded. No pleural effusion or pneumothorax. Cardiac silhouette is normal in size. IMPRESSION: 1. Significant interval improvement. Previously noted lung opacities have mostly resolved. This rapid improvement supports improved pulmonary edema over pneumonia. Electronically Signed   By: Lajean Manes M.D.   On: 03-09-2020 17:43   DG CHEST PORT 1 VIEW  Result Date: 02/11/2020 CLINICAL DATA:  Hypoxia. EXAM:  PORTABLE CHEST 1 VIEW COMPARISON:  01/12/2020 FINDINGS: Interval development of bilateral airspace disease  right greater than left. Probable pneumonia. No heart failure or effusion. Cardiac and mediastinal contours normal. IMPRESSION: Interval development of bilateral airspace disease right greater than left. Probable pneumonia. Electronically Signed   By: Franchot Gallo M.D.   On: 02/11/2020 10:24   DG Chest Portable 1 View  Result Date: 02/05/2020 CLINICAL DATA:  Altered mental status. EXAM: PORTABLE CHEST 1 VIEW COMPARISON:  Chest CT Aug 04, 2014 FINDINGS: Cardiomediastinal silhouette is normal. Mediastinal contours appear intact. Calcific atherosclerotic disease and tortuosity of the aorta. There is no evidence of focal airspace consolidation, pleural effusion or pneumothorax. Osseous structures are without acute abnormality. Soft tissues are grossly normal. IMPRESSION: 1. No active disease. 2. Calcific atherosclerotic disease and tortuosity of the aorta. Electronically Signed   By: Fidela Salisbury M.D.   On: 01/12/2020 14:43   ECHOCARDIOGRAM COMPLETE  Result Date: 02/12/2020    ECHOCARDIOGRAM REPORT   Patient Name:   Glenda Stevens Date of Exam: 02/12/2020 Medical Rec #:  [id number]  Height:       61.5 in Accession #:    [id number] Weight:       115.0 lb Date of Birth:  07-23-1945  BSA:          1.502 m Patient Age:    75 years   BP:           148/86 mmHg Patient Gender: F          HR:           83 bpm. Exam Location:  Inpatient Procedure: 2D Echo Indications:    acute systolic chf 720.94  History:        Patient has prior history of Echocardiogram examinations, most                 recent 07/08/2012. Risk Factors:Current Smoker, Hypertension and                 Dyslipidemia.  Sonographer:    Johny Chess Referring Phys: [id number] Autumm Hattery IMPRESSIONS  1. Left ventricular ejection fraction, by estimation, is 65 to 70%. The left ventricle has normal function. The left ventricle has no regional wall  motion abnormalities. There is mild asymmetric left ventricular hypertrophy. Left ventricular diastolic parameters are consistent with Grade I diastolic dysfunction (impaired relaxation).  2. Right ventricular systolic function is mildly reduced. The right ventricular size is normal. There is normal pulmonary artery systolic pressure. The estimated right ventricular systolic pressure is 70.9 mmHg.  3. The mitral valve is normal in structure. No evidence of mitral valve regurgitation. No evidence of mitral stenosis.  4. The aortic valve was not well visualized. Aortic valve regurgitation is not visualized. No aortic stenosis is present.  5. The inferior vena cava is normal in size with greater than 50% respiratory variability, suggesting right atrial pressure of 3 mmHg. FINDINGS  Left Ventricle: Left ventricular ejection fraction, by estimation, is 65 to 70%. The left ventricle has normal function. The left ventricle has no regional wall motion abnormalities. The left ventricular internal cavity size was small. There is mild asymmetric left ventricular hypertrophy. Left ventricular diastolic parameters are consistent with Grade I diastolic dysfunction (impaired relaxation). Right Ventricle: The right ventricular size is normal. Right vetricular wall thickness was not assessed. Right ventricular systolic function is mildly reduced. There is normal pulmonary artery systolic pressure. The tricuspid regurgitant velocity is 2.20  m/s, and with an assumed right atrial pressure of 3 mmHg, the estimated right ventricular systolic pressure is 62.8 mmHg.  Left Atrium: Left atrial size was normal in size. Right Atrium: Right atrial size was not well visualized. Pericardium: There is no evidence of pericardial effusion. Mitral Valve: The mitral valve is normal in structure. No evidence of mitral valve regurgitation. No evidence of mitral valve stenosis. Tricuspid Valve: The tricuspid valve is normal in structure. Tricuspid valve  regurgitation is trivial. Aortic Valve: The aortic valve was not well visualized. Aortic valve regurgitation is not visualized. No aortic stenosis is present. Pulmonic Valve: The pulmonic valve was not well visualized. Pulmonic valve regurgitation is not visualized. Aorta: The aortic root is normal in size and structure. Venous: The inferior vena cava is normal in size with greater than 50% respiratory variability, suggesting right atrial pressure of 3 mmHg. IAS/Shunts: The interatrial septum was not well visualized.  LEFT VENTRICLE PLAX 2D LVIDd:         3.00 cm  Diastology LVIDs:         2.20 cm  LV e' medial:    4.13 cm/s LV PW:         0.80 cm  LV E/e' medial:  12.5 LV IVS:        0.90 cm  LV e' lateral:   4.46 cm/s LVOT diam:     1.60 cm  LV E/e' lateral: 11.6 LV SV:         31 LV SV Index:   21 LVOT Area:     2.01 cm  RIGHT VENTRICLE             IVC RV S prime:     10.20 cm/s  IVC diam: 0.90 cm TAPSE (M-mode): 1.1 cm LEFT ATRIUM           Index      RIGHT ATRIUM          Index LA diam:      1.80 cm 1.20 cm/m RA Area:     5.92 cm LA Vol (A4C): 14.3 ml 9.52 ml/m RA Volume:   9.33 ml  6.21 ml/m  AORTIC VALVE LVOT Vmax:   72.50 cm/s LVOT Vmean:  48.100 cm/s LVOT VTI:    0.154 m  AORTA Ao Root diam: 2.90 cm MITRAL VALVE               TRICUSPID VALVE MV Area (PHT): 4.36 cm    TR Peak grad:   19.4 mmHg MV Decel Time: 174 msec    TR Vmax:        220.00 cm/s MV E velocity: 51.80 cm/s MV A velocity: 84.40 cm/s  SHUNTS MV E/A ratio:  0.61        Systemic VTI:  0.15 m                            Systemic Diam: 1.60 cm Oswaldo Milian MD Electronically signed by Oswaldo Milian MD Signature Date/Time: 02/12/2020/4:29:10 PM    Final    US Abdomen Limited RUQ (LIVER/GB)  Result Date: 02/08/2020 CLINICAL DATA:  Elevated liver enzymes.  Found down after fall. EXAM: ULTRASOUND ABDOMEN LIMITED RIGHT UPPER QUADRANT COMPARISON:  None. FINDINGS: Gallbladder: No gallstones or wall thickening visualized. No  sonographic Murphy sign noted by sonographer. Common bile duct: Diameter: 6 mm Liver: No focal lesion identified. Within normal limits in parenchymal echogenicity. Portal vein is patent on color Doppler imaging with normal direction of blood flow towards the liver. Other: None. IMPRESSION: Normal right upper quadrant ultrasound. Electronically Signed  By: Ulyses Jarred M.D.   On: 02/08/2020 01:22    Microbiology: No results found for this or any previous visit (from the past 240 hour(s)).   Labs: Basic Metabolic Panel: No results for input(s): NA, K, CL, CO2, GLUCOSE, BUN, CREATININE, CALCIUM, MG, PHOS in the last 168 hours. Liver Function Tests: No results for input(s): AST, ALT, ALKPHOS, BILITOT, PROT, ALBUMIN in the last 168 hours. No results for input(s): LIPASE, AMYLASE in the last 168 hours. No results for input(s): AMMONIA in the last 168 hours. CBC: No results for input(s): WBC, NEUTROABS, HGB, HCT, MCV, PLT in the last 168 hours. Cardiac Enzymes: No results for input(s): CKTOTAL, CKMB, CKMBINDEX, TROPONINI in the last 168 hours. BNP: BNP (last 3 results) No results for input(s): BNP in the last 8760 hours.  ProBNP (last 3 results) No results for input(s): PROBNP in the last 8760 hours.  CBG: Recent Labs  Lab 02-15-2020 1911  GLUCAP 135*    Active Problems:   Essential hypertension   Anxiety   Poor memory   Rhabdomyolysis   Hypothermia   Time coordinating discharge: 38 minutes.  Signed:        Kaylana Fenstermacher, DO [facility]  02/20/2020, 4:48 PM

## 2020-03-11 DEATH — deceased

## 2020-03-20 ENCOUNTER — Ambulatory Visit: Payer: Medicare Other | Admitting: Physician Assistant
